# Patient Record
Sex: Female | Born: 1950 | Race: White | Hispanic: No | Marital: Married | Smoking: Never smoker
Health system: Southern US, Community
[De-identification: ages and names within clinical notes are randomized; demographics above are authoritative.]

---

## 1988-04-23 DIAGNOSIS — D0359 Melanoma in situ of other part of trunk: Secondary | ICD-10-CM

## 1988-04-23 HISTORY — DX: Melanoma in situ of other part of trunk: D03.59

## 2004-03-30 ENCOUNTER — Other Ambulatory Visit: Admission: RE | Admit: 2004-03-30 | Discharge: 2004-03-30 | Payer: Self-pay | Admitting: Unknown Physician Specialty

## 2006-02-05 ENCOUNTER — Ambulatory Visit (HOSPITAL_COMMUNITY): Admission: RE | Admit: 2006-02-05 | Discharge: 2006-02-05 | Payer: Self-pay | Admitting: Family Medicine

## 2006-12-21 ENCOUNTER — Emergency Department (HOSPITAL_COMMUNITY): Admission: EM | Admit: 2006-12-21 | Discharge: 2006-12-22 | Payer: Self-pay | Admitting: Emergency Medicine

## 2008-03-02 ENCOUNTER — Ambulatory Visit (HOSPITAL_COMMUNITY): Admission: RE | Admit: 2008-03-02 | Discharge: 2008-03-02 | Payer: Self-pay | Admitting: Family Medicine

## 2009-04-05 ENCOUNTER — Ambulatory Visit (HOSPITAL_COMMUNITY): Admission: RE | Admit: 2009-04-05 | Discharge: 2009-04-05 | Payer: Self-pay | Admitting: Family Medicine

## 2011-02-02 LAB — CBC
Hemoglobin: 12.2
MCHC: 34.5
MCV: 92.8
RBC: 3.81 — ABNORMAL LOW

## 2011-02-02 LAB — BASIC METABOLIC PANEL
CO2: 28
Chloride: 100
GFR calc Af Amer: >60
Potassium: 2.5 — CL
Sodium: 135

## 2011-02-02 LAB — RAPID URINE DRUG SCREEN, HOSP PERFORMED
Amphetamines: NOT DETECTED
Cocaine: NOT DETECTED
Tetrahydrocannabinol: NOT DETECTED

## 2011-02-02 LAB — DIFFERENTIAL
Basophils Relative: 0
Eosinophils Absolute: 0.1
Monocytes Absolute: 0.4
Monocytes Relative: 6

## 2016-03-01 ENCOUNTER — Other Ambulatory Visit: Payer: Self-pay | Admitting: Family Medicine

## 2016-03-01 DIAGNOSIS — Z1231 Encounter for screening mammogram for malignant neoplasm of breast: Secondary | ICD-10-CM

## 2016-03-19 ENCOUNTER — Ambulatory Visit (HOSPITAL_COMMUNITY)
Admission: RE | Admit: 2016-03-19 | Discharge: 2016-03-19 | Disposition: A | Discharge status: Home / Self Care | Payer: BLUE CROSS/BLUE SHIELD | Source: Ambulatory Visit | Attending: Family Medicine | Admitting: Family Medicine

## 2016-03-19 ENCOUNTER — Encounter (HOSPITAL_COMMUNITY): Payer: Self-pay | Admitting: Radiology

## 2016-03-19 DIAGNOSIS — Z1231 Encounter for screening mammogram for malignant neoplasm of breast: Secondary | ICD-10-CM | POA: Diagnosis not present

## 2016-03-29 ENCOUNTER — Ambulatory Visit (INDEPENDENT_AMBULATORY_CARE_PROVIDER_SITE_OTHER): Payer: BLUE CROSS/BLUE SHIELD | Admitting: Nurse Practitioner

## 2016-03-29 ENCOUNTER — Encounter: Payer: Self-pay | Admitting: Nurse Practitioner

## 2016-03-29 VITALS — BP 110/72 | Temp 97.6°F | Ht 63.0 in | Wt 142.6 lb

## 2016-03-29 DIAGNOSIS — Z1211 Encounter for screening for malignant neoplasm of colon: Secondary | ICD-10-CM | POA: Diagnosis not present

## 2016-03-29 DIAGNOSIS — Z780 Asymptomatic menopausal state: Secondary | ICD-10-CM

## 2016-03-29 DIAGNOSIS — Z0000 Encounter for general adult medical examination without abnormal findings: Secondary | ICD-10-CM

## 2016-03-29 DIAGNOSIS — Z124 Encounter for screening for malignant neoplasm of cervix: Secondary | ICD-10-CM | POA: Diagnosis not present

## 2016-03-29 DIAGNOSIS — R5383 Other fatigue: Secondary | ICD-10-CM | POA: Diagnosis not present

## 2016-03-29 DIAGNOSIS — Z1151 Encounter for screening for human papillomavirus (HPV): Secondary | ICD-10-CM

## 2016-03-29 DIAGNOSIS — Z23 Encounter for immunization: Secondary | ICD-10-CM | POA: Diagnosis not present

## 2016-03-30 LAB — HEPATIC FUNCTION PANEL
ALK PHOS: 75 IU/L (ref 39–117)
ALT: 33 IU/L — AB (ref 0–32)
AST: 34 IU/L (ref 0–40)
Albumin: 4.8 g/dL (ref 3.6–4.8)
BILIRUBIN, DIRECT: 0.21 mg/dL (ref 0.00–0.40)
Bilirubin Total: 0.8 mg/dL (ref 0.0–1.2)
Total Protein: 7.1 g/dL (ref 6.0–8.5)

## 2016-03-30 LAB — TSH: TSH: 1.53 u[IU]/mL (ref 0.450–4.500)

## 2016-03-30 LAB — BASIC METABOLIC PANEL
BUN / CREAT RATIO: 11 — AB (ref 12–28)
BUN: 9 mg/dL (ref 8–27)
CHLORIDE: 101 mmol/L (ref 96–106)
CO2: 25 mmol/L (ref 18–29)
Calcium: 9.6 mg/dL (ref 8.7–10.3)
Creatinine, Ser: 0.82 mg/dL (ref 0.57–1.00)
GFR calc Af Amer: 88 mL/min/{1.73_m2} (ref >59)
GFR calc non Af Amer: 76 mL/min/{1.73_m2} (ref >59)
GLUCOSE: 91 mg/dL (ref 65–99)
Potassium: 3.9 mmol/L (ref 3.5–5.2)
SODIUM: 143 mmol/L (ref 134–144)

## 2016-03-30 LAB — LIPID PANEL
CHOL/HDL RATIO: 2.1 ratio (ref 0.0–4.4)
Cholesterol, Total: 228 mg/dL — ABNORMAL HIGH (ref 100–199)
HDL: 110 mg/dL (ref >39)
LDL Calculated: 106 mg/dL — ABNORMAL HIGH (ref 0–99)
TRIGLYCERIDES: 58 mg/dL (ref 0–149)
VLDL CHOLESTEROL CAL: 12 mg/dL (ref 5–40)

## 2016-03-30 LAB — VITAMIN D 25 HYDROXY (VIT D DEFICIENCY, FRACTURES): Vit D, 25-Hydroxy: 16.8 ng/mL — ABNORMAL LOW (ref 30.0–100.0)

## 2016-03-31 ENCOUNTER — Encounter: Payer: Self-pay | Admitting: Nurse Practitioner

## 2016-03-31 DIAGNOSIS — L578 Other skin changes due to chronic exposure to nonionizing radiation: Secondary | ICD-10-CM | POA: Insufficient documentation

## 2016-03-31 NOTE — Progress Notes (Signed)
   Subjective:    Patient ID: Melody Ward, female    DOB: 11-03-1950, 65 y.o.   MRN: [id number]  HPI presents for her wellness exam. Married, same sexual partner. No pelvic pain or vaginal bleeding. Regular vision and dental exams. History of melanoma. Has seen [employer] PA in the past. Very active, regular exercise.     Review of Systems  Constitutional: Positive for fatigue. Negative for activity change and appetite change.       Mild fatigue  HENT: Negative for dental problem, ear pain, sinus pressure and sore throat.   Respiratory: Negative for cough, chest tightness, shortness of breath and wheezing.   Cardiovascular: Negative for chest pain.  Gastrointestinal: Negative for abdominal distention, abdominal pain, blood in stool, constipation, diarrhea, nausea and vomiting.  Genitourinary: Negative for difficulty urinating, dysuria, enuresis, frequency, genital sores, pelvic pain, urgency, vaginal bleeding and vaginal discharge.       Objective:   Physical Exam  Constitutional: She is oriented to person, place, and time. She appears well-developed. No distress.  HENT:  Right Ear: External ear normal.  Left Ear: External ear normal.  Mouth/Throat: Oropharynx is clear and moist.  Neck: Normal range of motion. Neck supple. No tracheal deviation present. No thyromegaly present.  Cardiovascular: Normal rate, regular rhythm and normal heart sounds.  Exam reveals no gallop.   No murmur heard. Pulmonary/Chest: Effort normal and breath sounds normal.  Abdominal: Soft. She exhibits no distension. There is no tenderness.  Genitourinary: Vagina normal and uterus normal. No vaginal discharge found.  Genitourinary Comments: External GU: no rashes or lesions. Vagina: no discharge. Cervix normal in appearance. No CMT. Bimanual exam: no tenderness or obvious masses. Rectal exam: no masses; no stool for hemoccult.   Musculoskeletal: She exhibits no edema.  Lymphadenopathy:    She has no  cervical adenopathy.  Neurological: She is alert and oriented to person, place, and time.  Skin: Skin is warm and dry. No rash noted.  Significant sun damage.   Psychiatric: She has a normal mood and affect. Her behavior is normal.  Vitals reviewed. Breast exam: no masses; axillae no adenopathy.        Assessment & Plan:  Routine general medical examination at a health care facility - Plan: Pap IG and HPV (high risk) DNA detection, Lipid panel, Hepatic function panel, Basic metabolic panel, Ambulatory referral to Gastroenterology  Screening for cervical cancer - Plan: Pap IG and HPV (high risk) DNA detection  Screening for HPV (human papillomavirus) - Plan: Pap IG and HPV (high risk) DNA detection  Fatigue, unspecified type - Plan: TSH, VITAMIN D 25 Hydroxy (Vit-D Deficiency, Fractures)  Need for vaccination - Plan: Flu Vaccine QUAD 36+ mos IM  Postmenopausal - Plan: DG Bone Density  Screen for colon cancer - Plan: Ambulatory referral to Gastroenterology   Recommend recheck with dermatology. Recommend daily vitamin D and calcium.  Return in about 1 year (around 03/29/2017) for physical.

## 2016-04-03 ENCOUNTER — Encounter (INDEPENDENT_AMBULATORY_CARE_PROVIDER_SITE_OTHER): Payer: Self-pay | Admitting: *Deleted

## 2016-04-03 ENCOUNTER — Encounter: Payer: Self-pay | Admitting: Family Medicine

## 2016-04-03 LAB — PAP IG AND HPV HIGH-RISK
HPV, high-risk: NEGATIVE
PAP Smear Comment: 0

## 2016-04-04 ENCOUNTER — Ambulatory Visit (HOSPITAL_COMMUNITY)
Admission: RE | Admit: 2016-04-04 | Discharge: 2016-04-04 | Disposition: A | Discharge status: Home / Self Care | Payer: BLUE CROSS/BLUE SHIELD | Source: Ambulatory Visit | Attending: Nurse Practitioner | Admitting: Nurse Practitioner

## 2016-04-04 DIAGNOSIS — M85851 Other specified disorders of bone density and structure, right thigh: Secondary | ICD-10-CM | POA: Diagnosis not present

## 2016-04-04 DIAGNOSIS — Z780 Asymptomatic menopausal state: Secondary | ICD-10-CM | POA: Insufficient documentation

## 2016-04-04 DIAGNOSIS — M8588 Other specified disorders of bone density and structure, other site: Secondary | ICD-10-CM | POA: Diagnosis not present

## 2016-04-17 ENCOUNTER — Other Ambulatory Visit: Payer: Self-pay | Admitting: Nurse Practitioner

## 2016-04-17 ENCOUNTER — Encounter: Payer: Self-pay | Admitting: Nurse Practitioner

## 2016-04-17 DIAGNOSIS — M8580 Other specified disorders of bone density and structure, unspecified site: Secondary | ICD-10-CM | POA: Insufficient documentation

## 2016-04-17 MED ORDER — VITAMIN D (ERGOCALCIFEROL) 1.25 MG (50000 UNIT) PO CAPS: 50000.0000 [IU] | ORAL_CAPSULE | ORAL | 2 refills | Status: DC

## 2016-10-15 ENCOUNTER — Encounter: Payer: Self-pay | Admitting: Family Medicine

## 2016-10-15 ENCOUNTER — Ambulatory Visit (INDEPENDENT_AMBULATORY_CARE_PROVIDER_SITE_OTHER): Payer: No Typology Code available for payment source | Admitting: Family Medicine

## 2016-10-15 VITALS — BP 128/70 | Temp 98.8°F | Ht 63.0 in | Wt 140.0 lb

## 2016-10-15 DIAGNOSIS — H00036 Abscess of eyelid left eye, unspecified eyelid: Secondary | ICD-10-CM

## 2016-10-15 MED ORDER — CEPHALEXIN 500 MG PO CAPS: 500.0000 mg | ORAL_CAPSULE | Freq: Four times a day (QID) | ORAL | 0 refills | Status: DC

## 2016-10-15 NOTE — Progress Notes (Signed)
   Subjective:    Patient ID: Melody Ward, female    DOB: 24-Jun-1950, 66 y.o.   MRN: [id number]  HPIleft eye redness. Started 3 days ago. Called opthalmologist and he called in a zpack. Pt has not picked up prescription.   She relates having soreness underneath her left eye lid with some redness and swelling underneath the eyelid the pain is not severe she denies any redness or watering in the eyes no crusting no fevers no sinus symptoms no nausea vomiting diarrhea  Review of Systems See above    Objective:   Physical Exam Eardrums normal throat is normal neck no masses no sinus tenderness she does have tenderness in the left lower eyelid which appears to have a gland infected she also has swelling below this on the facial region that is red but she denies tenderness with this  Warning signs were discussed in detail no need for any surgical procedure at this point     Assessment & Plan:  Left eye-gland infection-Keflex 4 times a day for 7-10 days, if progressive troubles or if worse follow-up immediately.

## 2018-01-03 ENCOUNTER — Ambulatory Visit (INDEPENDENT_AMBULATORY_CARE_PROVIDER_SITE_OTHER): Payer: Medicare Other | Admitting: Family Medicine

## 2018-01-03 VITALS — BP 118/82 | Temp 97.6°F | Wt 141.4 lb

## 2018-01-03 DIAGNOSIS — J329 Chronic sinusitis, unspecified: Secondary | ICD-10-CM

## 2018-01-03 DIAGNOSIS — J310 Chronic rhinitis: Secondary | ICD-10-CM

## 2018-01-03 MED ORDER — CEFPROZIL 500 MG PO TABS: 500.0000 mg | ORAL_TABLET | Freq: Two times a day (BID) | ORAL | 0 refills | Status: DC

## 2018-01-03 NOTE — Progress Notes (Signed)
   Subjective:    Patient ID: Melody Ward, female    DOB: 1950/10/28, 67 y.o.   MRN: [id number]  Sinus Problem  This is a new problem. The current episode started 1 to 4 weeks ago. Associated symptoms include congestion, coughing and sinus pressure.   Cong and cough started around two weeks ago  Sometimes will start coughing  No sore throat  Gets hoarse off and onsince two yrs ago  Uses claritin plus muc d and getting gunky suff and green disch  dughter gets ogg    Non smoker    Feels molst up in the sinus area        Review of Systems  HENT: Positive for congestion and sinus pressure.   Respiratory: Positive for cough.        Objective:   Physical Exam  Alert, mild malaise. Hydration good Vitals stable. frontal/ maxillary tenderness evident positive nasal congestion. pharynx normal neck supple  lungs clear/no crackles or wheezes. heart regular in rhythm       Assessment & Plan:  Impression rhinosinusitis likely post viral, discussed with patient. plan antibiotics prescribed. Questions answered. Symptomatic care discussed. warning signs discussed. WSL Patient notes chronic recurrent drainage tickle cough and occasional hoarseness comes and goes for years.  Does not desire major work-up at this time

## 2018-03-24 ENCOUNTER — Telehealth: Payer: Self-pay | Admitting: *Deleted

## 2018-03-24 ENCOUNTER — Emergency Department (HOSPITAL_COMMUNITY): Payer: Medicare HMO

## 2018-03-24 ENCOUNTER — Other Ambulatory Visit: Payer: Self-pay

## 2018-03-24 ENCOUNTER — Encounter (HOSPITAL_COMMUNITY): Payer: Self-pay | Admitting: Emergency Medicine

## 2018-03-24 ENCOUNTER — Emergency Department (HOSPITAL_COMMUNITY)
Admission: EM | Admit: 2018-03-24 | Discharge: 2018-03-24 | Disposition: A | Discharge status: Home / Self Care | Payer: Medicare HMO | Attending: Emergency Medicine | Admitting: Emergency Medicine

## 2018-03-24 DIAGNOSIS — Y9389 Activity, other specified: Secondary | ICD-10-CM | POA: Insufficient documentation

## 2018-03-24 DIAGNOSIS — Z87891 Personal history of nicotine dependence: Secondary | ICD-10-CM | POA: Diagnosis not present

## 2018-03-24 DIAGNOSIS — M7022 Olecranon bursitis, left elbow: Secondary | ICD-10-CM | POA: Insufficient documentation

## 2018-03-24 DIAGNOSIS — Z79899 Other long term (current) drug therapy: Secondary | ICD-10-CM | POA: Insufficient documentation

## 2018-03-24 DIAGNOSIS — M25522 Pain in left elbow: Secondary | ICD-10-CM | POA: Diagnosis not present

## 2018-03-24 DIAGNOSIS — S59902A Unspecified injury of left elbow, initial encounter: Secondary | ICD-10-CM | POA: Diagnosis not present

## 2018-03-24 NOTE — ED Triage Notes (Signed)
Pt fell this morning in a hurry carrying several items and landing on her left elbow, pain and swelling.

## 2018-03-24 NOTE — Telephone Encounter (Signed)
Patient called and stated she fell this am and now her elbow is swelling and painful. Consult with Dr Nicki Reaper who advised patient to go to the ER for evaluation, xray and treatment. Patient verbalized understanding and stated she would go to ER.

## 2018-03-24 NOTE — ED Provider Notes (Signed)
[facility] EMERGENCY DEPARTMENT Provider Note   CSN: [id number] Arrival date & time: 03/24/18  1219     History   Chief Complaint Chief Complaint  Patient presents with  . elbow pain    HPI Melody Ward is a 67 y.o. female presenting for evaluation of left elbow pain and swelling.  She was walking down her 3 steps stoop this morning with her hands full, endorsing 1 of her shoes was not completely on which she suspects caused her to stumble, landing on the wooden step with her left elbow.  She endorses full range of motion of the elbow with minimal discomfort, but has had increased swelling and bruising at the site after going to her workout class in attempting to do push-ups.  She has applied ice to the elbow and is also taken ibuprofen 400 mg prior to arrival.  Denies weakness or numbness distal to the injury site and denies any other injuries including head, neck, shoulder hip or knee pain.  The history is provided by the patient.    History reviewed. No pertinent past medical history.  Patient Active Problem List   Diagnosis Date Noted  . Osteopenia 04/17/2016  . Sun-damaged skin 03/31/2016    History reviewed. No pertinent surgical history.   OB History   None      Home Medications    Prior to Admission medications   Medication Sig Start Date End Date Taking? Authorizing Provider  cefPROZIL (CEFZIL) 500 MG tablet Take 1 tablet (500 mg total) by mouth 2 (two) times daily. 01/03/18   Mikey Kirschner, MD  cephALEXin (KEFLEX) 500 MG capsule Take 1 capsule (500 mg total) by mouth 4 (four) times daily. Patient not taking: Reported on 01/03/2018 10/15/16   Kathyrn Drown, MD    Family History No family history on file.  Social History Social History   Tobacco Use  . Smoking status: Former Research scientist (life sciences)  . Smokeless tobacco: Never Used  Substance Use Topics  . Alcohol use: Yes    Comment: wine occ  . Drug use: Not Currently     Allergies   Patient has no known  allergies.   Review of Systems Review of Systems  Constitutional: Negative for fever.  Gastrointestinal: Negative for nausea and vomiting.  Musculoskeletal: Positive for arthralgias and joint swelling. Negative for back pain, myalgias and neck pain.  Neurological: Negative for weakness, numbness and headaches.     Physical Exam Updated Vital Signs BP (!) 149/96 (BP Location: Right Arm)   Pulse 69   Temp 98.1 F (36.7 C) (Oral)   Resp 12   Ht 5\' 3"  (1.6 m)   Wt 63.5 kg   SpO2 100%   BMI 24.80 kg/m   Physical Exam  Constitutional: She appears well-developed and well-nourished.  HENT:  Head: Atraumatic.  Neck: Normal range of motion.  Cardiovascular:  Pulses:      Radial pulses are 2+ on the right side, and 2+ on the left side.  Pulses equal bilaterally  Musculoskeletal: She exhibits edema and tenderness.       Left elbow: She exhibits swelling. Tenderness found. Olecranon process tenderness noted.  Edema and bruising noted left posterior elbow.  No palpable deformity.  Forearm wrist, hand and shoulder are all nontender.  Patient displays full range of motion of all the joints in her left arm without complaint of pain.  Neurological: She is alert. She has normal strength. She displays normal reflexes. No sensory deficit.  Equal grip  strength.  Skin: Skin is warm and dry.  Psychiatric: She has a normal mood and affect.     ED Treatments / Results  Labs (all labs ordered are listed, but only abnormal results are displayed) Labs Reviewed - No data to display  EKG None  Radiology Dg Elbow Complete Left  Result Date: 03/24/2018 CLINICAL DATA:  Pain after fall EXAM: LEFT ELBOW - COMPLETE 3+ VIEW COMPARISON:  None. FINDINGS: No fracture, dislocation, or joint effusion identified. Enthesopathic changes are associated with the humeral epicondyles. Rounded radiodensities project over the subcutaneous tissues of the upper arm, of doubtful acute significance. Recommend  clinical correlation. IMPRESSION: No fracture or dislocation.  No joint effusion noted. Electronically Signed   By: Dorise Bullion III M.D   On: 03/24/2018 13:26    Procedures Procedures (including critical care time)  Medications Ordered in ED Medications - No data to display   Initial Impression / Assessment and Plan / ED Course  I have reviewed the triage vital signs and the nursing notes.  Pertinent labs & imaging results that were available during my care of the patient were reviewed by me and considered in my medical decision making (see chart for details).     Imaging reviewed and discussed with patient.  She was put in an Ace wrap for gentle compression.  Advised continued ice, rest, ibuprofen.  Follow-up with her PCP for recheck in 7 to 10 days if she is not obtaining improvement of this swelling.  Final Clinical Impressions(s) / ED Diagnoses   Final diagnoses:  Olecranon bursitis of left elbow    ED Discharge Orders    None       Landis Martins 03/24/18 1345    Elnora Morrison, MD 03/24/18 1549

## 2018-03-24 NOTE — ED Notes (Addendum)
Patient had single trip and fall. Landed on left elbow. Patient tried to workout at gym afterwards and experienced pain with pushups. PCP recommended to get Xray at ED. Pt experiencing no pain at this time.

## 2018-03-24 NOTE — Discharge Instructions (Addendum)
Your x-rays are negative for any broken bones or dislocation.  Your exam and swelling suggest that your bursa is inflamed causing this pain and swelling.  I recommend using an ice pack as much as is comfortable for the next 2 days to help reduce the swelling along with reduced range of motion of the elbow joint.  A gentle Ace wrap over the joint may help.  I recommend continuing your ibuprofen 400 mg 3 times daily.  Plan to follow-up with your primary doctor for recheck if your symptoms are not improving over the next week to 10 days or if you develop any new symptoms such as increasing pain, redness or worse swelling at the site.

## 2018-03-24 NOTE — Telephone Encounter (Signed)
I agree with management 

## 2018-06-04 ENCOUNTER — Emergency Department (HOSPITAL_COMMUNITY)
Admission: EM | Admit: 2018-06-04 | Discharge: 2018-06-04 | Disposition: A | Discharge status: Home / Self Care | Payer: Medicare HMO | Attending: Emergency Medicine | Admitting: Emergency Medicine

## 2018-06-04 ENCOUNTER — Other Ambulatory Visit: Payer: Self-pay

## 2018-06-04 ENCOUNTER — Encounter (HOSPITAL_COMMUNITY): Payer: Self-pay | Admitting: Emergency Medicine

## 2018-06-04 ENCOUNTER — Emergency Department (HOSPITAL_COMMUNITY): Payer: Medicare HMO

## 2018-06-04 DIAGNOSIS — Y9301 Activity, walking, marching and hiking: Secondary | ICD-10-CM | POA: Diagnosis not present

## 2018-06-04 DIAGNOSIS — W109XXA Fall (on) (from) unspecified stairs and steps, initial encounter: Secondary | ICD-10-CM | POA: Diagnosis not present

## 2018-06-04 DIAGNOSIS — S52611A Displaced fracture of right ulna styloid process, initial encounter for closed fracture: Secondary | ICD-10-CM

## 2018-06-04 DIAGNOSIS — Y929 Unspecified place or not applicable: Secondary | ICD-10-CM | POA: Insufficient documentation

## 2018-06-04 DIAGNOSIS — S6991XA Unspecified injury of right wrist, hand and finger(s), initial encounter: Secondary | ICD-10-CM | POA: Diagnosis present

## 2018-06-04 DIAGNOSIS — Y999 Unspecified external cause status: Secondary | ICD-10-CM | POA: Diagnosis not present

## 2018-06-04 DIAGNOSIS — S52571A Other intraarticular fracture of lower end of right radius, initial encounter for closed fracture: Secondary | ICD-10-CM

## 2018-06-04 MED ORDER — HYDROCODONE-ACETAMINOPHEN 5-325 MG PO TABS: ORAL_TABLET | ORAL | 0 refills | Status: DC

## 2018-06-04 NOTE — Discharge Instructions (Addendum)
Elevate your wrist.  You may apply ice packs if needed.  Call Dr. Bertis Ruddy office to arrange a follow-up appointment.  He wants to see you in his office tomorrow you can call for an appointment time.

## 2018-06-04 NOTE — ED Provider Notes (Signed)
[facility] EMERGENCY DEPARTMENT Provider Note   CSN: [id number] Arrival date & time: 06/04/18  1026     History   Chief Complaint Chief Complaint  Patient presents with  . Wrist Pain    HPI Melody Ward is a 68 y.o. female.  HPI   Melody Ward is a 68 y.o. female who presents to the Emergency Department complaining of mechanical fall that occurred last evening.  She complains of pain and swelling to her right wrist.  She states that she fell down several steps landing on her buttocks and she is unclear how she injured her wrist.  She complains of increasing pain and swelling that is worse with movement.  Is applied ice last evening and again today and taking ibuprofen with moderate relief.  She denies pain to her elbow or shoulder, neck pain, head injury, LOC or back pain.  She is right-hand dominant.    History reviewed. No pertinent past medical history.  Patient Active Problem List   Diagnosis Date Noted  . Osteopenia 04/17/2016  . Sun-damaged skin 03/31/2016    Past Surgical History:  Procedure Laterality Date  . CESAREAN SECTION       OB History    Gravida      Para      Term      Preterm      AB      Living  1     SAB      TAB      Ectopic      Multiple      Live Births               Home Medications    Prior to Admission medications   Medication Sig Start Date End Date Taking? Authorizing Provider  cefPROZIL (CEFZIL) 500 MG tablet Take 1 tablet (500 mg total) by mouth 2 (two) times daily. 01/03/18   Mikey Kirschner, MD  cephALEXin (KEFLEX) 500 MG capsule Take 1 capsule (500 mg total) by mouth 4 (four) times daily. Patient not taking: Reported on 01/03/2018 10/15/16   Kathyrn Drown, MD    Family History History reviewed. No pertinent family history.  Social History Social History   Tobacco Use  . Smoking status: Former Research scientist (life sciences)  . Smokeless tobacco: Never Used  Substance Use Topics  . Alcohol use: Yes    Comment: wine  occ  . Drug use: Not Currently     Allergies   Patient has no known allergies.   Review of Systems Review of Systems  Constitutional: Negative for chills and fever.  Respiratory: Negative for shortness of breath.   Cardiovascular: Negative for chest pain.  Musculoskeletal: Positive for arthralgias (Right wrist pain and swelling) and joint swelling. Negative for back pain and neck pain.  Skin: Negative for color change and wound.  Neurological: Negative for dizziness, syncope, weakness and numbness.     Physical Exam Updated Vital Signs BP 132/86 (BP Location: Left Arm)   Pulse 70   Temp 98 F (36.7 C) (Oral)   Resp 18   Ht 5\' 3"  (1.6 m)   Wt 62.6 kg   SpO2 98%   BMI 24.45 kg/m   Physical Exam Vitals signs and nursing note reviewed.  Constitutional:      General: She is not in acute distress.    Appearance: Normal appearance. She is well-developed.  HENT:     Head: Atraumatic.  Neck:     Musculoskeletal: Normal range of motion  and neck supple.  Cardiovascular:     Rate and Rhythm: Normal rate and regular rhythm.     Pulses: Normal pulses.  Pulmonary:     Effort: Pulmonary effort is normal.     Breath sounds: Normal breath sounds.  Musculoskeletal:        General: Swelling, tenderness and signs of injury present. No deformity.     Comments: Tenderness to palpation of the distal right wrist.  Moderate edema noted.  Edema extends into the dorsum of the hand.  Fingers are nontender.  No open wound.  No tenderness proximal to the wrist.  No definite bony deformity.  Skin:    General: Skin is warm and dry.     Capillary Refill: Capillary refill takes less than 2 seconds.  Neurological:     General: No focal deficit present.     Mental Status: She is alert.     Sensory: No sensory deficit.     Motor: No weakness or abnormal muscle tone.     Coordination: Coordination normal.      ED Treatments / Results  Labs (all labs ordered are listed, but only abnormal  results are displayed) Labs Reviewed - No data to display  EKG None  Radiology Dg Wrist Complete Right  Result Date: 06/04/2018 CLINICAL DATA:  Acute wrist pain following fall yesterday. Initial encounter. EXAM: RIGHT WRIST - COMPLETE 3+ VIEW COMPARISON:  None. FINDINGS: A comminuted impacted intra-articular distal radial fracture is identified with associated soft tissue swelling. An ulnar styloid fracture is present. No dislocation. IMPRESSION: Comminuted impacted intra-articular distal radial fracture and ulnar styloid fracture. Electronically Signed   By: Margarette Canada M.D.   On: 06/04/2018 11:45    Procedures Procedures (including critical care time)  Medications Ordered in ED Medications - No data to display   Initial Impression / Assessment and Plan / ED Course  I have reviewed the triage vital signs and the nursing notes.  Pertinent labs & imaging results that were available during my care of the patient were reviewed by me and considered in my medical decision making (see chart for details).     Patient with closed impacted and comminuted distal radius fracture.  Neurovascularly intact.  Compartments are soft. Will consult hand surgeon.    [place], Dr. Burney Gauze, recommends sugar tong splint and he will see pt in his office tomorrow.    1300 sugar tong applied, pain improved, remains NV intact.  Pt agrees to f/u instructions  Final Clinical Impressions(s) / ED Diagnoses   Final diagnoses:  Other closed intra-articular fracture of distal end of right radius, initial encounter  Closed displaced fracture of styloid process of right ulna, initial encounter    ED Discharge Orders    None       Kem Parkinson, PA-C 06/04/18 1305    Fredia Sorrow, MD 06/10/18 [phone]

## 2018-06-04 NOTE — ED Provider Notes (Signed)
Medical screening examination/treatment/procedure(s) were conducted as a shared visit with non-physician practitioner(s) and myself.  I personally evaluated the patient during the encounter.  None  Results for orders placed or performed in visit on 03/29/16  Lipid panel  Result Value Ref Range   Cholesterol, Total 228 (H) 100 - 199 mg/dL   Triglycerides 58 0 - 149 mg/dL   HDL 110 >39 mg/dL   VLDL Cholesterol Cal 12 5 - 40 mg/dL   LDL Calculated 106 (H) 0 - 99 mg/dL   Chol/HDL Ratio 2.1 0.0 - 4.4 ratio units  Hepatic function panel  Result Value Ref Range   Total Protein 7.1 6.0 - 8.5 g/dL   Albumin 4.8 3.6 - 4.8 g/dL   Bilirubin Total 0.8 0.0 - 1.2 mg/dL   Bilirubin, Direct 0.21 0.00 - 0.40 mg/dL   Alkaline Phosphatase 75 39 - 117 IU/L   AST 34 0 - 40 IU/L   ALT 33 (H) 0 - 32 IU/L  Basic metabolic panel  Result Value Ref Range   Glucose 91 65 - 99 mg/dL   BUN 9 8 - 27 mg/dL   Creatinine, Ser 0.82 0.57 - 1.00 mg/dL   GFR calc non Af Amer 76 >59 mL/min/1.73   GFR calc Af Amer 88 >59 mL/min/1.73   BUN/Creatinine Ratio 11 (L) 12 - 28   Sodium 143 134 - 144 mmol/L   Potassium 3.9 3.5 - 5.2 mmol/L   Chloride 101 96 - 106 mmol/L   CO2 25 18 - 29 mmol/L   Calcium 9.6 8.7 - 10.3 mg/dL  TSH  Result Value Ref Range   TSH 1.530 0.450 - 4.500 uIU/mL  VITAMIN D 25 Hydroxy (Vit-D Deficiency, Fractures)  Result Value Ref Range   Vit D, 25-Hydroxy 16.8 (L) 30.0 - 100.0 ng/mL  Pap IG and HPV (high risk) DNA detection  Result Value Ref Range   DIAGNOSIS: Comment    Specimen adequacy: Comment    Clinician Provided ICD10 Comment    Performed by: Comment    QC reviewed by: Comment    PAP Smear Comment .    PATHOLOGIST PROVIDED ICD10: Comment    Note: Comment    Test Methodology Comment    HPV, high-risk Negative Negative   Dg Wrist Complete Right  Result Date: 06/04/2018 CLINICAL DATA:  Acute wrist pain following fall yesterday. Initial encounter. EXAM: RIGHT WRIST - COMPLETE 3+  VIEW COMPARISON:  None. FINDINGS: A comminuted impacted intra-articular distal radial fracture is identified with associated soft tissue swelling. An ulnar styloid fracture is present. No dislocation. IMPRESSION: Comminuted impacted intra-articular distal radial fracture and ulnar styloid fracture. Electronically Signed   By: Margarette Canada M.D.   On: 06/04/2018 11:45     Patient seen by me along with the physician assistant.  Patient had a fall resulting in a comminuted impacted intra-articular distal radial fracture and ulnar styloid fracture.  Discussed with Dr. Burney Gauze who will follow the patient up.  We will put the patient in a sugar tong splint.  No other injuries.  Patient distally with good cap refill.  Good sensation.     Fredia Sorrow, MD 06/04/18 1249

## 2018-06-04 NOTE — ED Triage Notes (Signed)
PT states she slipped and fell last night and fell onto her right wrist. PT c/o swelling and pain to right wrist with ROM.

## 2018-06-04 NOTE — ED Notes (Signed)
Ice pack given to pt for her wrist.

## 2018-06-05 DIAGNOSIS — S52571A Other intraarticular fracture of lower end of right radius, initial encounter for closed fracture: Secondary | ICD-10-CM | POA: Diagnosis not present

## 2018-06-06 DIAGNOSIS — G8918 Other acute postprocedural pain: Secondary | ICD-10-CM | POA: Diagnosis not present

## 2018-06-06 DIAGNOSIS — S52571A Other intraarticular fracture of lower end of right radius, initial encounter for closed fracture: Secondary | ICD-10-CM | POA: Diagnosis not present

## 2018-06-06 DIAGNOSIS — Y999 Unspecified external cause status: Secondary | ICD-10-CM | POA: Diagnosis not present

## 2018-06-06 HISTORY — PX: WRIST SURGERY: SHX841

## 2018-06-09 DIAGNOSIS — M25531 Pain in right wrist: Secondary | ICD-10-CM | POA: Diagnosis not present

## 2018-06-09 DIAGNOSIS — M25631 Stiffness of right wrist, not elsewhere classified: Secondary | ICD-10-CM | POA: Diagnosis not present

## 2018-06-16 DIAGNOSIS — S52571A Other intraarticular fracture of lower end of right radius, initial encounter for closed fracture: Secondary | ICD-10-CM | POA: Diagnosis not present

## 2018-06-30 DIAGNOSIS — S52571A Other intraarticular fracture of lower end of right radius, initial encounter for closed fracture: Secondary | ICD-10-CM | POA: Diagnosis not present

## 2018-07-22 DIAGNOSIS — S52571A Other intraarticular fracture of lower end of right radius, initial encounter for closed fracture: Secondary | ICD-10-CM | POA: Diagnosis not present

## 2018-08-12 DIAGNOSIS — S52571A Other intraarticular fracture of lower end of right radius, initial encounter for closed fracture: Secondary | ICD-10-CM | POA: Diagnosis not present

## 2018-09-17 ENCOUNTER — Other Ambulatory Visit: Payer: Self-pay

## 2018-09-17 ENCOUNTER — Ambulatory Visit (INDEPENDENT_AMBULATORY_CARE_PROVIDER_SITE_OTHER): Payer: Medicare HMO | Admitting: Family Medicine

## 2018-09-17 DIAGNOSIS — J329 Chronic sinusitis, unspecified: Secondary | ICD-10-CM | POA: Diagnosis not present

## 2018-09-17 DIAGNOSIS — R490 Dysphonia: Secondary | ICD-10-CM

## 2018-09-17 DIAGNOSIS — J310 Chronic rhinitis: Secondary | ICD-10-CM

## 2018-09-17 MED ORDER — CEFDINIR 300 MG PO CAPS: 300.0000 mg | ORAL_CAPSULE | Freq: Two times a day (BID) | ORAL | 0 refills | Status: DC

## 2018-09-17 NOTE — Progress Notes (Signed)
   Subjective:    Patient ID: Melody Ward, female    DOB: 03/12/1951, 68 y.o.   MRN: [id number] Audio plus video Cough  This is a new problem. The current episode started in the past 7 days. Associated symptoms include a sore throat. Associated symptoms comments: Dry cough-problems with voice.      Review of Systems  HENT: Positive for sore throat.   Respiratory: Positive for cough.    Virtual Visit via Video Note  I connected with Melody Ward on 09/17/18 at  1:10 PM EDT by a video enabled telemedicine application and verified that I am speaking with the correct person using two identifiers.  Location: Patient: home Provider: office   I discussed the limitations of evaluation and management by telemedicine and the availability of in person appointments. The patient expressed understanding and agreed to proceed.  History of Present Illness:    Observations/Objective:   Assessment and Plan:   Follow Up Instructions:    I discussed the assessment and treatment plan with the patient. The patient was provided an opportunity to ask questions and all were answered. The patient agreed with the plan and demonstrated an understanding of the instructions.   The patient was advised to call back or seek an in-person evaluation if the symptoms worsen or if the condition fails to improve as anticipated.  I provided 25 minutes of non-face-to-face time during this encounter.  Patient concerned regarding the intermittent and chronic nature of her hoarseness.  At times will settle in 4 weeks on and until it improves.  Patient starting to worry about it.  There is some history of cancer and a friend who had some similar symptoms which is making the patient worried even more.  No fever no chills.  No nasal discharge.  Patient reports some cough productive at times in the past couple weeks   No headache, no major weight loss or weight gain, no chest pain no back pain abdominal pain no  change in bowel habits complete ROS otherwise negative     Objective:   Physical Exam  Virtual visit      Assessment & Plan:  Impression acute laryngitis with potential element of rhinosinusitis and or bronchitis.  Antibiotics prescribed symptom care discussed.  2.  Chronic hoarseness/laryngitis.  Patient feels motivated to take this to the next level and I think that is a good idea.  ENT referral.  Potential etiologies discussed with patient

## 2018-09-23 DIAGNOSIS — H52229 Regular astigmatism, unspecified eye: Secondary | ICD-10-CM | POA: Diagnosis not present

## 2018-09-23 DIAGNOSIS — Z0100 Encounter for examination of eyes and vision without abnormal findings: Secondary | ICD-10-CM | POA: Diagnosis not present

## 2018-10-07 ENCOUNTER — Encounter: Payer: Self-pay | Admitting: Family Medicine

## 2018-10-16 ENCOUNTER — Telehealth: Payer: Self-pay | Admitting: Family Medicine

## 2018-10-16 NOTE — Telephone Encounter (Signed)
[facility] - need correct mailing address, mail was returned

## 2018-10-21 ENCOUNTER — Other Ambulatory Visit: Payer: Medicare HMO

## 2018-10-21 ENCOUNTER — Other Ambulatory Visit: Payer: Self-pay

## 2018-10-21 DIAGNOSIS — Z20822 Contact with and (suspected) exposure to covid-19: Secondary | ICD-10-CM

## 2018-10-21 DIAGNOSIS — R6889 Other general symptoms and signs: Secondary | ICD-10-CM | POA: Diagnosis not present

## 2018-10-27 ENCOUNTER — Ambulatory Visit (INDEPENDENT_AMBULATORY_CARE_PROVIDER_SITE_OTHER): Payer: Medicare HMO | Admitting: Otolaryngology

## 2018-10-27 DIAGNOSIS — R070 Pain in throat: Secondary | ICD-10-CM

## 2018-10-27 DIAGNOSIS — K219 Gastro-esophageal reflux disease without esophagitis: Secondary | ICD-10-CM | POA: Diagnosis not present

## 2018-10-27 LAB — NOVEL CORONAVIRUS, NAA: SARS-CoV-2, NAA: NOT DETECTED

## 2018-11-05 ENCOUNTER — Telehealth: Payer: Self-pay | Admitting: *Deleted

## 2018-11-05 NOTE — Telephone Encounter (Signed)
Pt returned call and lab results of covid-19 given to her with verbal understanding. Her results showed that the virus was not detected in her swab.

## 2018-12-01 DIAGNOSIS — R05 Cough: Secondary | ICD-10-CM | POA: Diagnosis not present

## 2018-12-01 DIAGNOSIS — J040 Acute laryngitis: Secondary | ICD-10-CM | POA: Diagnosis not present

## 2018-12-08 ENCOUNTER — Ambulatory Visit (INDEPENDENT_AMBULATORY_CARE_PROVIDER_SITE_OTHER): Payer: Medicare HMO | Admitting: Otolaryngology

## 2018-12-22 ENCOUNTER — Ambulatory Visit (INDEPENDENT_AMBULATORY_CARE_PROVIDER_SITE_OTHER): Payer: Medicare HMO | Admitting: Otolaryngology

## 2018-12-22 DIAGNOSIS — K219 Gastro-esophageal reflux disease without esophagitis: Secondary | ICD-10-CM

## 2018-12-22 DIAGNOSIS — R490 Dysphonia: Secondary | ICD-10-CM

## 2019-01-16 DIAGNOSIS — R490 Dysphonia: Secondary | ICD-10-CM | POA: Diagnosis not present

## 2019-01-16 DIAGNOSIS — J383 Other diseases of vocal cords: Secondary | ICD-10-CM | POA: Diagnosis not present

## 2019-01-16 DIAGNOSIS — J3801 Paralysis of vocal cords and larynx, unilateral: Secondary | ICD-10-CM | POA: Diagnosis not present

## 2019-01-19 ENCOUNTER — Other Ambulatory Visit (HOSPITAL_COMMUNITY): Payer: Self-pay | Admitting: Otolaryngology

## 2019-01-19 ENCOUNTER — Other Ambulatory Visit: Payer: Self-pay | Admitting: Otolaryngology

## 2019-01-19 DIAGNOSIS — J3801 Paralysis of vocal cords and larynx, unilateral: Secondary | ICD-10-CM

## 2019-01-20 DIAGNOSIS — M542 Cervicalgia: Secondary | ICD-10-CM | POA: Diagnosis not present

## 2019-01-20 DIAGNOSIS — R490 Dysphonia: Secondary | ICD-10-CM | POA: Diagnosis not present

## 2019-01-20 DIAGNOSIS — J387 Other diseases of larynx: Secondary | ICD-10-CM | POA: Diagnosis not present

## 2019-01-20 DIAGNOSIS — J3801 Paralysis of vocal cords and larynx, unilateral: Secondary | ICD-10-CM | POA: Diagnosis not present

## 2019-01-20 DIAGNOSIS — H9202 Otalgia, left ear: Secondary | ICD-10-CM | POA: Diagnosis not present

## 2019-01-20 DIAGNOSIS — J383 Other diseases of vocal cords: Secondary | ICD-10-CM | POA: Diagnosis not present

## 2019-01-20 DIAGNOSIS — R070 Pain in throat: Secondary | ICD-10-CM | POA: Diagnosis not present

## 2019-01-29 DIAGNOSIS — J387 Other diseases of larynx: Secondary | ICD-10-CM | POA: Diagnosis not present

## 2019-01-29 DIAGNOSIS — C321 Malignant neoplasm of supraglottis: Secondary | ICD-10-CM | POA: Diagnosis not present

## 2019-01-29 DIAGNOSIS — H9202 Otalgia, left ear: Secondary | ICD-10-CM | POA: Diagnosis not present

## 2019-01-29 DIAGNOSIS — R490 Dysphonia: Secondary | ICD-10-CM | POA: Diagnosis not present

## 2019-01-29 DIAGNOSIS — M542 Cervicalgia: Secondary | ICD-10-CM | POA: Diagnosis not present

## 2019-01-29 DIAGNOSIS — J3801 Paralysis of vocal cords and larynx, unilateral: Secondary | ICD-10-CM | POA: Diagnosis not present

## 2019-01-29 DIAGNOSIS — R070 Pain in throat: Secondary | ICD-10-CM | POA: Diagnosis not present

## 2019-02-02 ENCOUNTER — Ambulatory Visit (HOSPITAL_COMMUNITY): Payer: Medicare HMO

## 2019-02-02 ENCOUNTER — Encounter (HOSPITAL_COMMUNITY): Payer: Self-pay

## 2019-02-04 DIAGNOSIS — J387 Other diseases of larynx: Secondary | ICD-10-CM | POA: Diagnosis not present

## 2019-02-04 DIAGNOSIS — C328 Malignant neoplasm of overlapping sites of larynx: Secondary | ICD-10-CM | POA: Diagnosis not present

## 2019-02-04 DIAGNOSIS — R918 Other nonspecific abnormal finding of lung field: Secondary | ICD-10-CM | POA: Diagnosis not present

## 2019-02-04 DIAGNOSIS — C329 Malignant neoplasm of larynx, unspecified: Secondary | ICD-10-CM | POA: Diagnosis not present

## 2019-02-04 DIAGNOSIS — Z7722 Contact with and (suspected) exposure to environmental tobacco smoke (acute) (chronic): Secondary | ICD-10-CM | POA: Diagnosis not present

## 2019-02-10 DIAGNOSIS — Z08 Encounter for follow-up examination after completed treatment for malignant neoplasm: Secondary | ICD-10-CM | POA: Diagnosis not present

## 2019-02-10 DIAGNOSIS — Z1283 Encounter for screening for malignant neoplasm of skin: Secondary | ICD-10-CM | POA: Diagnosis not present

## 2019-02-10 DIAGNOSIS — X32XXXD Exposure to sunlight, subsequent encounter: Secondary | ICD-10-CM | POA: Diagnosis not present

## 2019-02-10 DIAGNOSIS — D0461 Carcinoma in situ of skin of right upper limb, including shoulder: Secondary | ICD-10-CM | POA: Diagnosis not present

## 2019-02-10 DIAGNOSIS — Z85820 Personal history of malignant melanoma of skin: Secondary | ICD-10-CM | POA: Diagnosis not present

## 2019-02-10 DIAGNOSIS — D044 Carcinoma in situ of skin of scalp and neck: Secondary | ICD-10-CM | POA: Diagnosis not present

## 2019-02-10 DIAGNOSIS — L570 Actinic keratosis: Secondary | ICD-10-CM | POA: Diagnosis not present

## 2019-02-12 DIAGNOSIS — C770 Secondary and unspecified malignant neoplasm of lymph nodes of head, face and neck: Secondary | ICD-10-CM | POA: Diagnosis not present

## 2019-02-12 DIAGNOSIS — Z7722 Contact with and (suspected) exposure to environmental tobacco smoke (acute) (chronic): Secondary | ICD-10-CM | POA: Diagnosis not present

## 2019-02-12 DIAGNOSIS — C329 Malignant neoplasm of larynx, unspecified: Secondary | ICD-10-CM | POA: Diagnosis not present

## 2019-02-12 DIAGNOSIS — C321 Malignant neoplasm of supraglottis: Secondary | ICD-10-CM | POA: Diagnosis not present

## 2019-03-03 DIAGNOSIS — Z01810 Encounter for preprocedural cardiovascular examination: Secondary | ICD-10-CM | POA: Diagnosis not present

## 2019-03-03 DIAGNOSIS — Z01812 Encounter for preprocedural laboratory examination: Secondary | ICD-10-CM | POA: Diagnosis not present

## 2019-03-03 DIAGNOSIS — J3801 Paralysis of vocal cords and larynx, unilateral: Secondary | ICD-10-CM | POA: Diagnosis not present

## 2019-03-03 DIAGNOSIS — Z7289 Other problems related to lifestyle: Secondary | ICD-10-CM | POA: Diagnosis not present

## 2019-03-03 DIAGNOSIS — C329 Malignant neoplasm of larynx, unspecified: Secondary | ICD-10-CM | POA: Diagnosis not present

## 2019-03-03 DIAGNOSIS — R490 Dysphonia: Secondary | ICD-10-CM | POA: Diagnosis not present

## 2019-03-06 ENCOUNTER — Other Ambulatory Visit: Payer: Self-pay

## 2019-03-06 DIAGNOSIS — C7989 Secondary malignant neoplasm of other specified sites: Secondary | ICD-10-CM | POA: Diagnosis not present

## 2019-03-06 DIAGNOSIS — Z4659 Encounter for fitting and adjustment of other gastrointestinal appliance and device: Secondary | ICD-10-CM | POA: Diagnosis not present

## 2019-03-06 DIAGNOSIS — R110 Nausea: Secondary | ICD-10-CM | POA: Diagnosis not present

## 2019-03-06 DIAGNOSIS — C328 Malignant neoplasm of overlapping sites of larynx: Secondary | ICD-10-CM | POA: Diagnosis not present

## 2019-03-06 DIAGNOSIS — C770 Secondary and unspecified malignant neoplasm of lymph nodes of head, face and neck: Secondary | ICD-10-CM | POA: Diagnosis not present

## 2019-03-06 DIAGNOSIS — C321 Malignant neoplasm of supraglottis: Secondary | ICD-10-CM | POA: Diagnosis not present

## 2019-03-06 DIAGNOSIS — D62 Acute posthemorrhagic anemia: Secondary | ICD-10-CM | POA: Diagnosis not present

## 2019-03-06 DIAGNOSIS — C801 Malignant (primary) neoplasm, unspecified: Secondary | ICD-10-CM | POA: Diagnosis not present

## 2019-03-06 DIAGNOSIS — J3801 Paralysis of vocal cords and larynx, unilateral: Secondary | ICD-10-CM | POA: Diagnosis not present

## 2019-03-06 DIAGNOSIS — C329 Malignant neoplasm of larynx, unspecified: Secondary | ICD-10-CM | POA: Diagnosis not present

## 2019-03-06 DIAGNOSIS — R490 Dysphonia: Secondary | ICD-10-CM | POA: Diagnosis not present

## 2019-03-06 DIAGNOSIS — Z85820 Personal history of malignant melanoma of skin: Secondary | ICD-10-CM | POA: Diagnosis not present

## 2019-03-06 DIAGNOSIS — E43 Unspecified severe protein-calorie malnutrition: Secondary | ICD-10-CM | POA: Diagnosis not present

## 2019-03-06 DIAGNOSIS — Z930 Tracheostomy status: Secondary | ICD-10-CM | POA: Diagnosis not present

## 2019-03-06 DIAGNOSIS — E876 Hypokalemia: Secondary | ICD-10-CM | POA: Diagnosis not present

## 2019-03-06 DIAGNOSIS — Z6822 Body mass index (BMI) 22.0-22.9, adult: Secondary | ICD-10-CM | POA: Diagnosis not present

## 2019-03-06 HISTORY — PX: NERVE GRAFT: SHX721

## 2019-03-06 HISTORY — PX: VOICE PROSTHESIS: SHX5222

## 2019-03-06 HISTORY — PX: PHARYNGECTOMY: SUR1024

## 2019-03-06 HISTORY — PX: MODIFIED RADICAL NECK DISSECTION: SHX2045

## 2019-03-06 HISTORY — PX: FREE FLAP RADIAL FOREARM: SHX1678

## 2019-03-06 HISTORY — PX: TOTAL LARYNGECTOMY: SHX2543

## 2019-03-06 HISTORY — PX: NECK DISSECTION: SUR422

## 2019-03-13 MED ORDER — ENOXAPARIN SODIUM 40 MG/0.4ML ~~LOC~~ SOLN
40.00 | SUBCUTANEOUS | Status: DC
Start: 2019-03-14 — End: 2019-03-13

## 2019-03-13 MED ORDER — ONDANSETRON HCL 4 MG/2ML IJ SOLN
4.00 | INTRAMUSCULAR | Status: DC
Start: ? — End: 2019-03-13

## 2019-03-13 MED ORDER — ASPIRIN 325 MG PO TABS
325.00 | ORAL_TABLET | ORAL | Status: DC
Start: 2019-03-14 — End: 2019-03-13

## 2019-03-13 MED ORDER — Medication
Status: DC
Start: 2019-03-14 — End: 2019-03-13

## 2019-03-13 MED ORDER — BACITRACIN ZINC 500 UNIT/GM EX OINT
TOPICAL_OINTMENT | CUTANEOUS | Status: DC
Start: 2019-03-13 — End: 2019-03-13

## 2019-03-13 MED ORDER — MELATONIN 3 MG PO TABS
6.00 | ORAL_TABLET | ORAL | Status: DC
Start: ? — End: 2019-03-13

## 2019-03-13 MED ORDER — OXYCODONE HCL 5 MG PO TABS
5.00 | ORAL_TABLET | ORAL | Status: DC
Start: ? — End: 2019-03-13

## 2019-03-13 MED ORDER — GENERIC EXTERNAL MEDICATION
Status: DC
Start: 2019-03-13 — End: 2019-03-13

## 2019-03-13 MED ORDER — CELECOXIB 200 MG PO CAPS
200.00 | ORAL_CAPSULE | ORAL | Status: DC
Start: 2019-03-13 — End: 2019-03-13

## 2019-03-13 MED ORDER — POLYETHYLENE GLYCOL 3350 17 G PO PACK
17.00 | PACK | ORAL | Status: DC
Start: 2019-03-14 — End: 2019-03-13

## 2019-03-13 MED ORDER — ACETAMINOPHEN 500 MG PO TABS
1000.00 | ORAL_TABLET | ORAL | Status: DC
Start: 2019-03-13 — End: 2019-03-13

## 2019-03-13 MED ORDER — Medication
Status: DC
Start: 2019-03-13 — End: 2019-03-13

## 2019-03-13 MED ORDER — GENERIC EXTERNAL MEDICATION
40.00 | Status: DC
Start: 2019-03-13 — End: 2019-03-13

## 2019-03-13 MED ORDER — CHLORHEXIDINE GLUCONATE 0.12 % MT SOLN
15.00 | OROMUCOSAL | Status: DC
Start: 2019-03-13 — End: 2019-03-13

## 2019-03-14 DIAGNOSIS — C329 Malignant neoplasm of larynx, unspecified: Secondary | ICD-10-CM | POA: Diagnosis not present

## 2019-03-14 DIAGNOSIS — Z4801 Encounter for change or removal of surgical wound dressing: Secondary | ICD-10-CM | POA: Diagnosis not present

## 2019-03-14 DIAGNOSIS — Z483 Aftercare following surgery for neoplasm: Secondary | ICD-10-CM | POA: Diagnosis not present

## 2019-03-14 DIAGNOSIS — R1310 Dysphagia, unspecified: Secondary | ICD-10-CM | POA: Diagnosis not present

## 2019-03-14 DIAGNOSIS — J383 Other diseases of vocal cords: Secondary | ICD-10-CM | POA: Diagnosis not present

## 2019-03-14 DIAGNOSIS — M542 Cervicalgia: Secondary | ICD-10-CM | POA: Diagnosis not present

## 2019-03-14 DIAGNOSIS — Z4803 Encounter for change or removal of drains: Secondary | ICD-10-CM | POA: Diagnosis not present

## 2019-03-14 DIAGNOSIS — Z431 Encounter for attention to gastrostomy: Secondary | ICD-10-CM | POA: Diagnosis not present

## 2019-03-14 DIAGNOSIS — Z48298 Encounter for aftercare following other organ transplant: Secondary | ICD-10-CM | POA: Diagnosis not present

## 2019-03-14 DIAGNOSIS — R490 Dysphonia: Secondary | ICD-10-CM | POA: Diagnosis not present

## 2019-03-14 DIAGNOSIS — H9202 Otalgia, left ear: Secondary | ICD-10-CM | POA: Diagnosis not present

## 2019-03-17 DIAGNOSIS — Z9889 Other specified postprocedural states: Secondary | ICD-10-CM | POA: Diagnosis not present

## 2019-03-17 DIAGNOSIS — Z8521 Personal history of malignant neoplasm of larynx: Secondary | ICD-10-CM | POA: Diagnosis not present

## 2019-03-17 DIAGNOSIS — Z4889 Encounter for other specified surgical aftercare: Secondary | ICD-10-CM | POA: Diagnosis not present

## 2019-03-17 DIAGNOSIS — C329 Malignant neoplasm of larynx, unspecified: Secondary | ICD-10-CM | POA: Diagnosis not present

## 2019-03-18 DIAGNOSIS — Z431 Encounter for attention to gastrostomy: Secondary | ICD-10-CM | POA: Diagnosis not present

## 2019-03-18 DIAGNOSIS — Z4803 Encounter for change or removal of drains: Secondary | ICD-10-CM | POA: Diagnosis not present

## 2019-03-18 DIAGNOSIS — M542 Cervicalgia: Secondary | ICD-10-CM | POA: Diagnosis not present

## 2019-03-18 DIAGNOSIS — J383 Other diseases of vocal cords: Secondary | ICD-10-CM | POA: Diagnosis not present

## 2019-03-18 DIAGNOSIS — C329 Malignant neoplasm of larynx, unspecified: Secondary | ICD-10-CM | POA: Diagnosis not present

## 2019-03-18 DIAGNOSIS — H9202 Otalgia, left ear: Secondary | ICD-10-CM | POA: Diagnosis not present

## 2019-03-18 DIAGNOSIS — Z48298 Encounter for aftercare following other organ transplant: Secondary | ICD-10-CM | POA: Diagnosis not present

## 2019-03-18 DIAGNOSIS — Z483 Aftercare following surgery for neoplasm: Secondary | ICD-10-CM | POA: Diagnosis not present

## 2019-03-18 DIAGNOSIS — Z4801 Encounter for change or removal of surgical wound dressing: Secondary | ICD-10-CM | POA: Diagnosis not present

## 2019-03-18 DIAGNOSIS — R490 Dysphonia: Secondary | ICD-10-CM | POA: Diagnosis not present

## 2019-03-20 DIAGNOSIS — Z20828 Contact with and (suspected) exposure to other viral communicable diseases: Secondary | ICD-10-CM | POA: Diagnosis not present

## 2019-03-20 DIAGNOSIS — J449 Chronic obstructive pulmonary disease, unspecified: Secondary | ICD-10-CM | POA: Diagnosis not present

## 2019-03-20 DIAGNOSIS — Z79899 Other long term (current) drug therapy: Secondary | ICD-10-CM | POA: Diagnosis not present

## 2019-03-20 DIAGNOSIS — J383 Other diseases of vocal cords: Secondary | ICD-10-CM | POA: Diagnosis not present

## 2019-03-20 DIAGNOSIS — R490 Dysphonia: Secondary | ICD-10-CM | POA: Diagnosis not present

## 2019-03-20 DIAGNOSIS — L988 Other specified disorders of the skin and subcutaneous tissue: Secondary | ICD-10-CM | POA: Diagnosis not present

## 2019-03-20 DIAGNOSIS — Z85820 Personal history of malignant melanoma of skin: Secondary | ICD-10-CM | POA: Diagnosis not present

## 2019-03-20 DIAGNOSIS — Z9889 Other specified postprocedural states: Secondary | ICD-10-CM | POA: Diagnosis not present

## 2019-03-20 DIAGNOSIS — Z431 Encounter for attention to gastrostomy: Secondary | ICD-10-CM | POA: Diagnosis not present

## 2019-03-20 DIAGNOSIS — Z48298 Encounter for aftercare following other organ transplant: Secondary | ICD-10-CM | POA: Diagnosis not present

## 2019-03-20 DIAGNOSIS — Z4803 Encounter for change or removal of drains: Secondary | ICD-10-CM | POA: Diagnosis not present

## 2019-03-20 DIAGNOSIS — M542 Cervicalgia: Secondary | ICD-10-CM | POA: Diagnosis not present

## 2019-03-20 DIAGNOSIS — R509 Fever, unspecified: Secondary | ICD-10-CM | POA: Diagnosis not present

## 2019-03-20 DIAGNOSIS — K122 Cellulitis and abscess of mouth: Secondary | ICD-10-CM | POA: Diagnosis not present

## 2019-03-20 DIAGNOSIS — Z791 Long term (current) use of non-steroidal anti-inflammatories (NSAID): Secondary | ICD-10-CM | POA: Diagnosis not present

## 2019-03-20 DIAGNOSIS — H9202 Otalgia, left ear: Secondary | ICD-10-CM | POA: Diagnosis not present

## 2019-03-20 DIAGNOSIS — Z4801 Encounter for change or removal of surgical wound dressing: Secondary | ICD-10-CM | POA: Diagnosis not present

## 2019-03-20 DIAGNOSIS — C329 Malignant neoplasm of larynx, unspecified: Secondary | ICD-10-CM | POA: Diagnosis not present

## 2019-03-20 DIAGNOSIS — Z9002 Acquired absence of larynx: Secondary | ICD-10-CM | POA: Diagnosis not present

## 2019-03-20 DIAGNOSIS — Z483 Aftercare following surgery for neoplasm: Secondary | ICD-10-CM | POA: Diagnosis not present

## 2019-03-20 DIAGNOSIS — Z7982 Long term (current) use of aspirin: Secondary | ICD-10-CM | POA: Diagnosis not present

## 2019-03-21 DIAGNOSIS — R221 Localized swelling, mass and lump, neck: Secondary | ICD-10-CM | POA: Diagnosis not present

## 2019-03-21 DIAGNOSIS — R509 Fever, unspecified: Secondary | ICD-10-CM | POA: Diagnosis not present

## 2019-03-21 DIAGNOSIS — R918 Other nonspecific abnormal finding of lung field: Secondary | ICD-10-CM | POA: Diagnosis not present

## 2019-03-23 MED ORDER — Medication
Status: DC
Start: 2019-03-23 — End: 2019-03-23

## 2019-03-23 MED ORDER — GENERIC EXTERNAL MEDICATION
Status: DC
Start: 2019-03-24 — End: 2019-03-23

## 2019-03-23 MED ORDER — ENOXAPARIN SODIUM 40 MG/0.4ML ~~LOC~~ SOLN
40.00 | SUBCUTANEOUS | Status: DC
Start: 2019-03-24 — End: 2019-03-23

## 2019-03-23 MED ORDER — DOCUSATE SODIUM 100 MG PO CAPS
100.00 | ORAL_CAPSULE | ORAL | Status: DC
Start: ? — End: 2019-03-23

## 2019-03-23 MED ORDER — ASPIRIN 325 MG PO TABS
325.00 | ORAL_TABLET | ORAL | Status: DC
Start: 2019-03-24 — End: 2019-03-23

## 2019-03-23 MED ORDER — Medication
5.00 | Status: DC
Start: ? — End: 2019-03-23

## 2019-03-23 MED ORDER — POLYETHYLENE GLYCOL 3350 17 G PO PACK
17.00 | PACK | ORAL | Status: DC
Start: ? — End: 2019-03-23

## 2019-03-23 MED ORDER — BISACODYL 10 MG RE SUPP
10.00 | RECTAL | Status: DC
Start: ? — End: 2019-03-23

## 2019-03-23 MED ORDER — GENERIC EXTERNAL MEDICATION
40.00 | Status: DC
Start: 2019-03-24 — End: 2019-03-23

## 2019-03-23 MED ORDER — GENERIC EXTERNAL MEDICATION
1.50 | Status: DC
Start: 2019-03-23 — End: 2019-03-23

## 2019-03-23 MED ORDER — MELATONIN 3 MG PO TABS
6.00 | ORAL_TABLET | ORAL | Status: DC
Start: ? — End: 2019-03-23

## 2019-03-23 MED ORDER — ACETAMINOPHEN 500 MG PO TABS
1000.00 | ORAL_TABLET | ORAL | Status: DC
Start: 2019-03-24 — End: 2019-03-23

## 2019-03-23 MED ORDER — ONDANSETRON HCL 4 MG/2ML IJ SOLN
4.00 | INTRAMUSCULAR | Status: DC
Start: ? — End: 2019-03-23

## 2019-03-23 MED ORDER — CELECOXIB 200 MG PO CAPS
200.00 | ORAL_CAPSULE | ORAL | Status: DC
Start: 2019-03-23 — End: 2019-03-23

## 2019-03-25 ENCOUNTER — Telehealth: Payer: Self-pay | Admitting: Radiation Oncology

## 2019-03-25 DIAGNOSIS — R490 Dysphonia: Secondary | ICD-10-CM | POA: Diagnosis not present

## 2019-03-25 DIAGNOSIS — L988 Other specified disorders of the skin and subcutaneous tissue: Secondary | ICD-10-CM | POA: Diagnosis not present

## 2019-03-25 DIAGNOSIS — C329 Malignant neoplasm of larynx, unspecified: Secondary | ICD-10-CM | POA: Diagnosis not present

## 2019-03-25 NOTE — Telephone Encounter (Signed)
New message: ° ° °LVM for patient to return call to schedule from referral received. °

## 2019-03-30 DIAGNOSIS — Z483 Aftercare following surgery for neoplasm: Secondary | ICD-10-CM | POA: Diagnosis not present

## 2019-03-30 DIAGNOSIS — Z4803 Encounter for change or removal of drains: Secondary | ICD-10-CM | POA: Diagnosis not present

## 2019-03-30 DIAGNOSIS — C329 Malignant neoplasm of larynx, unspecified: Secondary | ICD-10-CM | POA: Diagnosis not present

## 2019-03-30 DIAGNOSIS — Z48298 Encounter for aftercare following other organ transplant: Secondary | ICD-10-CM | POA: Diagnosis not present

## 2019-03-30 DIAGNOSIS — J383 Other diseases of vocal cords: Secondary | ICD-10-CM | POA: Diagnosis not present

## 2019-03-30 DIAGNOSIS — R490 Dysphonia: Secondary | ICD-10-CM | POA: Diagnosis not present

## 2019-03-30 DIAGNOSIS — M542 Cervicalgia: Secondary | ICD-10-CM | POA: Diagnosis not present

## 2019-03-30 DIAGNOSIS — H9202 Otalgia, left ear: Secondary | ICD-10-CM | POA: Diagnosis not present

## 2019-03-30 DIAGNOSIS — Z4801 Encounter for change or removal of surgical wound dressing: Secondary | ICD-10-CM | POA: Diagnosis not present

## 2019-03-30 DIAGNOSIS — Z431 Encounter for attention to gastrostomy: Secondary | ICD-10-CM | POA: Diagnosis not present

## 2019-03-31 ENCOUNTER — Telehealth: Payer: Self-pay | Admitting: *Deleted

## 2019-03-31 NOTE — Telephone Encounter (Addendum)
Oncology Nurse Navigator Documentation  Placed introductory call to new referral patient Ms. Melody Ward.  Spoke with dtr Melody Ward, who asked I call back this afternoon as they are in car.  Addendum 1619:  Spoke with dtr, further introduced my self, provided contact information.  She confirmed understanding of referral by [name] for her mom to see [name] to discuss post-surgical RT.  I explained she can expect call from [name] to arrange video/telephone consult.  She voiced understanding/appreciation.   Gayleen Orem, RN, BSN Head & Neck Oncology Nurse [place] at [place] [phone]

## 2019-04-01 DIAGNOSIS — Z483 Aftercare following surgery for neoplasm: Secondary | ICD-10-CM | POA: Diagnosis not present

## 2019-04-01 DIAGNOSIS — R490 Dysphonia: Secondary | ICD-10-CM | POA: Diagnosis not present

## 2019-04-01 DIAGNOSIS — C329 Malignant neoplasm of larynx, unspecified: Secondary | ICD-10-CM | POA: Diagnosis not present

## 2019-04-01 NOTE — Progress Notes (Signed)
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 LEFT HYPOPHARYNX, BIOPSY:    No malignancy identified. B. LEFT SUPERIOR PHARYNX, BIOPSY:    No malignancy identified. C. LEFT TONGUE BASE, BIOPSY:    No malignancy identified. D. LARYNX AND LEFT PHARYNX, TOTAL LARYNGECTOMY WITH LEFT PHARYNGECTOMY:    Invasive squamous cell carcinoma, moderately differentiated,      nonkeratinizing.    Tumor size: 2 cm.    Tumor involves paraglottic space.    Perineural invasion identified.    Margins negative for malignancy.    Pathologic stage: pT3 pN1.    See tumor protocol summary below. E. LYMPH NODES, RIGHT NECK LEVELS 2A, 3 AND 4, EXCISION:    Twelve lymph nodes, negative for metastasis (0/12). F. LYMPH NODES, RIGHT NECK LEVELS 2B, EXCISION:    Ten lymph nodes, negative for metastasis (0/10). G. LYMPH NODES, LEFT NECK LEVELS 2A, 3 AND 4, EXCISION:    Metastatic carcinoma involving 1 of 19 lymph nodes (1/19).    Tumor deposit size: 0.4 cm.    No extranodal extension. H. LYMPH NODES, LEFT NECK 2B, EXCISION:    Six lymph nodes, negative for metastasis (0/6).  Patient presented about a year ago with symptoms of: progressive intermittent hoarseness and odynophagia.   Biopsies of Larynx and left pharynx revealed: Invasive squamous cell carcinoma, moderately differentiated.   Nutrition Status Yes No Comments  Weight changes? [x]  []    Swallowing concerns? [x]  []  She has been told that she can begin trying to swallow water. She has not attempted this yet.   PEG? [x]  [x]  She has a nasogastric feeding tube in place, and is doing bolus feedings   Referrals Yes No Comments  Social Work? []  [x]    Dentistry? []  [x]    Swallowing therapy? [x]  []   [facility]  Nutrition? [x]  []  While inpatient at [facility]  Med/Onc? []  [x]     Safety Issues Yes No Comments  Prior radiation? []  [x]    Pacemaker/ICD? []  [x]    Possible current pregnancy? []  [x]    Is the patient on methotrexate? []  [x]     Tobacco/Marijuana/Snuff/ETOH use: She tells me she has never smoked. She does drink alcohol occasionally  Past/Anticipated interventions by otolaryngology, if any:  03/06/19 Dr. Nicolette Bang [name] Procedures/Surgeries performed during hospitalization:  LARYNGECTOMY TOTAL, pharyngectomy, bilateral neck dissection (Bilateral Neck) NECK DISSECTION MODIFIED RADICAL <6HRS (N/A Neck) FREE FLAP RADIAL FOREARM vs parascapular vs latissimus dorsi flap, pharyngoplasty, voice prosthesis/TEP placement, nerve graft (Left Arm Lower)   04/01/19 Dr. Nicolette Bang Impression  s/p laryngopharyngectomy, BND, and TEP, with a RFFF reconstruction ([name]) on 03/06/19 . Pathology with 2 cm tumor, negative margins, +PNI, no LVI, and 1/47 nodes ( 4 mm, left neck, no ENE).  She has had some healing challenges but currently doing well. She is very close to closing this fistula and the neck is much-improved from last week.  Wound care : continue wet-to-dry, pack a little gentler and less deeply now Diet - NPO for now, but can sip water freely.  She has an appt with [name] next week. I can see her then.  Will set up appt with [name] to discuss adjuvant RT She is agreeable with this plan. All her questions were answered.   04/08/19 [name] and Dr. Nicolette Bang and Speech Therapy with [name].   Past/Anticipated interventions by medical oncology, if any:  None scheduled at time of note.    Current Complaints / other details:   She saw Dr. Margarette Canada (Radiation Oncology at [facility]) on 02/04/19 for initial consult.  She saw Dr. Casimer Leek (Radiation Oncology at [facility]) on 02/12/19 for consultation.

## 2019-04-02 ENCOUNTER — Ambulatory Visit
Admission: RE | Admit: 2019-04-02 | Discharge: 2019-04-02 | Disposition: A | Discharge status: Home / Self Care | Payer: Self-pay | Source: Ambulatory Visit | Attending: Radiation Oncology | Admitting: Radiation Oncology

## 2019-04-02 ENCOUNTER — Telehealth: Payer: Self-pay | Admitting: *Deleted

## 2019-04-02 ENCOUNTER — Other Ambulatory Visit: Payer: Self-pay | Admitting: *Deleted

## 2019-04-02 DIAGNOSIS — C329 Malignant neoplasm of larynx, unspecified: Secondary | ICD-10-CM | POA: Diagnosis not present

## 2019-04-02 DIAGNOSIS — Z4803 Encounter for change or removal of drains: Secondary | ICD-10-CM | POA: Diagnosis not present

## 2019-04-02 DIAGNOSIS — H9202 Otalgia, left ear: Secondary | ICD-10-CM | POA: Diagnosis not present

## 2019-04-02 DIAGNOSIS — R490 Dysphonia: Secondary | ICD-10-CM | POA: Diagnosis not present

## 2019-04-02 DIAGNOSIS — J383 Other diseases of vocal cords: Secondary | ICD-10-CM | POA: Diagnosis not present

## 2019-04-02 DIAGNOSIS — Z4801 Encounter for change or removal of surgical wound dressing: Secondary | ICD-10-CM | POA: Diagnosis not present

## 2019-04-02 DIAGNOSIS — Z48298 Encounter for aftercare following other organ transplant: Secondary | ICD-10-CM | POA: Diagnosis not present

## 2019-04-02 DIAGNOSIS — Z483 Aftercare following surgery for neoplasm: Secondary | ICD-10-CM | POA: Diagnosis not present

## 2019-04-02 DIAGNOSIS — Z431 Encounter for attention to gastrostomy: Secondary | ICD-10-CM | POA: Diagnosis not present

## 2019-04-02 DIAGNOSIS — M542 Cervicalgia: Secondary | ICD-10-CM | POA: Diagnosis not present

## 2019-04-02 NOTE — Telephone Encounter (Signed)
Oncology Nurse Navigator Documentation  Fax sent to [facility] Radiology Imaging Library requesting the following post-surgical imaging be pushed to Power Share:  03/21/2019 CT Neck/Thyroid    03/21/2019 CT Chest Notification of successful fax transmission received. Orders placed for assignment to PACS Timeline.  Request for pre-surgical imaging pending.  Gayleen Orem, RN, BSN Head & Neck Oncology Nurse [place] at [place] [phone]

## 2019-04-03 ENCOUNTER — Encounter: Payer: Self-pay | Admitting: Radiation Oncology

## 2019-04-03 ENCOUNTER — Encounter: Payer: Self-pay | Admitting: Family Medicine

## 2019-04-03 ENCOUNTER — Ambulatory Visit
Admission: RE | Admit: 2019-04-03 | Discharge: 2019-04-03 | Disposition: A | Discharge status: Home / Self Care | Payer: Self-pay | Source: Ambulatory Visit | Attending: Radiation Oncology | Admitting: Radiation Oncology

## 2019-04-03 ENCOUNTER — Telehealth: Payer: Self-pay | Admitting: Nutrition

## 2019-04-03 ENCOUNTER — Ambulatory Visit
Admission: RE | Admit: 2019-04-03 | Discharge: 2019-04-03 | Disposition: A | Discharge status: Home / Self Care | Payer: Medicare HMO | Source: Ambulatory Visit | Attending: Radiation Oncology | Admitting: Radiation Oncology

## 2019-04-03 ENCOUNTER — Other Ambulatory Visit: Payer: Self-pay

## 2019-04-03 ENCOUNTER — Other Ambulatory Visit: Payer: Self-pay | Admitting: *Deleted

## 2019-04-03 DIAGNOSIS — R5381 Other malaise: Secondary | ICD-10-CM

## 2019-04-03 DIAGNOSIS — C329 Malignant neoplasm of larynx, unspecified: Secondary | ICD-10-CM

## 2019-04-03 DIAGNOSIS — Z1329 Encounter for screening for other suspected endocrine disorder: Secondary | ICD-10-CM

## 2019-04-03 DIAGNOSIS — C320 Malignant neoplasm of glottis: Secondary | ICD-10-CM | POA: Diagnosis not present

## 2019-04-03 DIAGNOSIS — C770 Secondary and unspecified malignant neoplasm of lymph nodes of head, face and neck: Secondary | ICD-10-CM | POA: Diagnosis not present

## 2019-04-03 DIAGNOSIS — Z9049 Acquired absence of other specified parts of digestive tract: Secondary | ICD-10-CM | POA: Diagnosis not present

## 2019-04-03 NOTE — Telephone Encounter (Signed)
Scheduled appt per 12/11 sch message for nutrition apt. Pt is aware of appt

## 2019-04-03 NOTE — Telephone Encounter (Signed)
Called the number pt left for [name] and it hung up on me too. Its a recording and says operator not available and enter your extension number. I called pt's daughter to try and figure out what it going on and she states she had appt today with [name] and then her office called her and her mother meant to send this message to [name] not dr Bonnye Fava and she will help her mom tonight send it to [name].

## 2019-04-03 NOTE — Progress Notes (Signed)
Dental Form with Estimates of Radiation Dose      Diagnosis: Laryngeal cancer  Prognosis: curable  Anticipated # of fractions: 30    Daily?: yes  # of weeks of radiotherapy: 6  Chemotherapy?: no  Anticipated xerostomia:  Mild permanent   Pre-simulation needs:   Scatter protection if feasible before a simulation 12/18; no extractions, please.  Simulation: ASAP     Other Notes: need to simulate by PM of 12/18. Can you evaluate and place scatter guards by then?  Please contact Eppie Gibson, MD, with patient's disposition after evaluation and/or dental treatment.   -----------------------------------  Eppie Gibson, MD

## 2019-04-03 NOTE — Progress Notes (Signed)
Radiation Oncology         [phone] ________________________________  Initial outpatient MyChart Video Consultation  Name: Melody Ward MRN: [id number]  Date: 04/03/2019  DOB: June 25, 1950  [id number], Grace Bushy, MD  [name], MD   REFERRING PHYSICIAN: [name], MD  DIAGNOSIS:    ICD-10-CM   1. Screening for hypothyroidism  Z13.29 TSH    T4, free  2. Laryngeal cancer (HCC)  C32.9 TSH    T4, free    Ambulatory referral to Physical Therapy    Amb Referral to Nutrition and Diabetic E    Referral to Neuro Rehab    Ambulatory referral to Dentistry    Ambulatory referral to Social Work  3. 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 female who presented with a history of voice hoarseness. She initially developed this in 04/2017 but did not begin to have an issue with it until 06/2018 following an upper respiratory infection.    Subsequently, the patient saw Dr. Benjamine Mola who performed laryngoscopy on 10/27/2018. This showed: diffuse laryngeal erythema, worse on the left side; posterior laryngeal area also moderately edematous; no suspicious mass or lesion noted. She was treated for laryngopharyngeal reflux with no relief.   She was subsequently referred to Dr. Rowe Clack on 01/20/2019. Laryngoscopy performed at that time revealed: paralyzed left true vocal cord; mass with extension into left subglottis and on left pyriform sinus. She underwent neck/thyroid CT at [facility] on 01/29/2019, which revealed: 34 mm transglottic laryngeal mass invades the left greater than right epiglottis, pharyngoepiglottic folds, aryepiglottic folds,  vocal cords, anterior commissure and left pyriform sinus, lateral and posterior pharyngeal walls, and subglottic larynx; no evidence of extralaryngeal extension; new asymmetric sclerosis of the left arytenoid cartilage suspicious for neoplastic involvement; 8 mm right level III node suspicious for nodal metastasis.  Biopsy of the laryngeal mass performed that day revealed: invasive squamous cell carcinoma, moderately to poorly differentiated; positive for p40, negative for p16.  She was then referred to [name] on 02/04/2019, who recommended surgery. Subsequently the patient saw Dr. Nicolette Bang on 03/03/2019 and proceeded to total laryngectomy, partial left pharyngectomy, and total neck dissection on 03/06/2019. Pathology from the procedure revealed: tumor size of 2 cm; tumor involves paraglottic space; perineural invasion identified; margins negative; nodal status of one positive for metastatic carcinoma (1/47). ENE neg, LVSI neg.  Pertinent imaging thus far includes chest CT performed on 03/21/2019 revealing aside from surgical changes, no acute CT abnormalities. Neck CT performed the same day showed: extensive postsurgical changes.     Nutrition Status Yes No Comments  Weight changes? [x]  []    Swallowing concerns? [x]  []  She has been told that she can begin trying to swallow water. She has not attempted this yet.   PEG? [x]  [x]  She has a nasogastric feeding tube in place, and is doing bolus feedings   Referrals Yes No Comments  Social Work? []  [x]    Dentistry? []  [x]    Swallowing therapy? [x]  []  [facility]  Nutrition? [x]  []  While inpatient at [facility]  Med/Onc? []  [x]     Safety Issues Yes No Comments  Prior radiation? []  [  x]   Pacemaker/ICD? []  [x]    Possible current pregnancy? []  [x]    Is the patient on methotrexate? []  [x]     Tobacco/Marijuana/Snuff/ETOH use: She tells me she has never smoked. She does drink alcohol occasionally    She is still healing, recovering from fistula in neck, with  wound dressings.  PATH: ACCESSION NUMBERNG:[id number] RECEIVED: 03/06/2019 ORDERING PHYSICIAN: Theodoro Kalata , MD PATIENT NAME: Ward, Melody LOVE SURGICAL PATHOLOGY REPORT  FINAL PATHOLOGIC DIAGNOSIS MICROSCOPIC EXAMINATION AND DIAGNOSIS  A. LEFT HYPOPHARYNX, BIOPSY:    No malignancy identified.  B. LEFT SUPERIOR PHARYNX, BIOPSY:    No malignancy identified.  C. LEFT TONGUE BASE, BIOPSY:    No malignancy identified.  D. LARYNX AND LEFT PHARYNX, TOTAL LARYNGECTOMY WITH LEFT PHARYNGECTOMY:    Invasive squamous cell carcinoma, moderately differentiated,      nonkeratinizing.    Tumor size: 2 cm.    Tumor involves paraglottic space.    Perineural invasion identified.    Margins negative for malignancy.    Pathologic stage: pT3 pN1.    See tumor protocol summary below.  E. LYMPH NODES, RIGHT NECK LEVELS 2A, 3 AND 4, EXCISION:    Twelve lymph nodes, negative for metastasis (0/12).  F. LYMPH NODES, RIGHT NECK LEVELS 2B, EXCISION:    Ten lymph nodes, negative for metastasis (0/10).  G. LYMPH NODES, LEFT NECK LEVELS 2A, 3 AND 4, EXCISION:    Metastatic carcinoma involving 1 of 19 lymph nodes (1/19).    Tumor deposit size: 0.4 cm.    No extranodal extension.  H. LYMPH NODES, LEFT NECK 2B, EXCISION:    Six lymph nodes, negative for metastasis (0/6).   Surgical Pathology Cancer Case Summary (AJCC 8th Edition): LARYNX (SUPRAGLOTTIS, GLOTTIS, SUBGLOTTIS)  CAP Protocol posting date: June 2017 Version: Larynx 4.0.0.1  PROCEDURE: Total laryngectomy, partial left pharyngectomy TUMOR SITE: Larynx, supraglottis and glottis TRANSGLOTTIC EXTENSION: Present TUMOR LATERALITY: Left, Midline, Right TUMOR FOCALITY: Unifocal TUMOR SIZE: GREATEST DIMENSION: 2 cm HISTOLOGIC TYPE: Squamous cell carcinoma, nonkeratinizing HISTOLOGIC GRADE: G2: Moderately differentiated TUMOR EXTENSION: Paraglottic  space MARGINS: Uninvolved by invasive tumor LYMPHOVASCULAR INVASION: Not identified PERINEURAL INVASION: Present REGIONAL LYMPH NODES:  NUMBER OF LYMPH NODES INVOLVED: 1  NUMBER OF LYMPH NODES EXAMINED: 67  LATERALITY OF LYMPH NODES INVOLVED: Ipsilateral  SIZE OF LARGEST METASTATIC DEPOSIT: 0.4 cm  EXTRANODAL EXTENSION: Not identified PATHOLOGIC STAGE CLASSIFICATION (pTNM, AJCC 8TH Ed): pT3 pN1 pT3: Tumor limited to larynx with vocal cord fixation and/or invades any of the following: postcricoid area, preepiglottic space, paraglottic space and/or inner cortex of thyroid cartilage pN1: Metastasis in a single ipsilateral lymph node, 3 cm or smaller in greatest dimension and ENE(-)  PREVIOUS RADIATION THERAPY: No  PAST MEDICAL HISTORY:  has a past medical history of melanoma (1990).    PAST SURGICAL HISTORY: Past Surgical History:  Procedure Laterality Date  . CESAREAN SECTION    . FREE FLAP RADIAL FOREARM  03/06/2019   Dr. Nicolette Bang [facility]  . MODIFIED RADICAL NECK DISSECTION  03/06/2019  . NECK DISSECTION  03/06/2019   bilateral neck dissection  . NERVE GRAFT Left 03/06/2019   left arm lower, Dr. Nicolette Bang at [facility]  . PHARYNGECTOMY  03/06/2019   Dr. Nicolette Bang at [facility]  . TOTAL LARYNGECTOMY  03/06/2019   Dr. Nicolette Bang at [facility]  . 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 Nicolette Bang [facility]  . WRIST SURGERY  06/06/2018   ORIF distal radius fracture    FAMILY HISTORY: family history is not  on file.  SOCIAL HISTORY:  reports that she has never smoked. She has never used smokeless tobacco. She reports current alcohol use. She reports previous drug use.  ALLERGIES: Patient has no known allergies.  MEDICATIONS:  Current Outpatient Medications  Medication Sig Dispense Refill  . acetaminophen (TYLENOL) 500 MG tablet Take by mouth.    . celecoxib (CELEBREX) 200 MG capsule     . pantoprazole sodium (PROTONIX) 40 mg/20 mL PACK Give 20  mLs by tube 2 (two) times daily.    . polyethylene glycol powder (GLYCOLAX/MIRALAX) 17 GM/SCOOP powder 17 g by Per NG tube route daily.    Marland Kitchen oxyCODONE (OXY IR/ROXICODONE) 5 MG immediate release tablet     . traMADol (ULTRAM) 50 MG tablet      No current facility-administered medications for this encounter.    REVIEW OF SYSTEMS:  Notable for that above.   PHYSICAL EXAM:  vitals were not taken for this visit.   General: Alert and oriented, in no acute distress  Psychiatric: Judgment and insight are intact. Affect is appropriate.    LABORATORY DATA:  Lab Results  Component Value Date   WBC 7.6 12/21/2006   HGB 12.2 12/21/2006   HCT 35.3 (L) 12/21/2006   MCV 92.8 12/21/2006   PLT 162 12/21/2006   CMP     Component Value Date/Time   NA 143 03/29/2016 1429   K 3.9 03/29/2016 1429   CL 101 03/29/2016 1429   CO2 25 03/29/2016 1429   GLUCOSE 91 03/29/2016 1429   GLUCOSE 127 (H) 12/21/2006 2340   BUN 9 03/29/2016 1429   CREATININE 0.82 03/29/2016 1429   CALCIUM 9.6 03/29/2016 1429   PROT 7.1 03/29/2016 1429   ALBUMIN 4.8 03/29/2016 1429   AST 34 03/29/2016 1429   ALT 33 (H) 03/29/2016 1429   ALKPHOS 75 03/29/2016 1429   BILITOT 0.8 03/29/2016 1429   GFRNONAA 76 03/29/2016 1429   GFRAA 88 03/29/2016 1429      Lab Results  Component Value Date   TSH 1.530 03/29/2016     RADIOGRAPHY:  As above    IMPRESSION/PLAN:  This is a delightful patient with head and neck cancer. I do recommend adjuvant radiotherapy for this patient.  We discussed the potential risks, benefits, and side effects of radiotherapy. We talked in detail about acute and late effects. We discussed that some of the most bothersome acute effects may be mucositis, dysgeusia, salivary changes, skin irritation, hair loss, dehydration, weight loss and fatigue. We talked about late effects which include but are not necessarily limited to dysphagia, hypothyroidism, nerve injury, spinal cord injury, xerostomia,  trismus, and neck edema. No guarantees of treatment were given. The patient is enthusiastic about proceeding with treatment. I look forward to participating in the patient's care.    Simulation (treatment planning) will take place in about a week.  I've emailed her surgeons to ask them to let me know when she will be released for start of treatment given healing issues.  We also discussed that the treatment of head and neck cancer is a multidisciplinary process to maximize treatment outcomes and quality of life. For this reason the following referrals have been or will be made:   Dentistry for dental evaluation, advice on reducing risk of cavities, osteoradionecrosis, or other oral issues.   Nutritionist for nutrition support during and after treatment.   Speech language pathology for swallowing and/or speech therapy.   Social work for social support.    Physical therapy due  to risk of lymphedema in neck and deconditioning.   Baseline labs including TSH.  This encounter was provided by telemedicine platform MyChart Video The patient has given verbal consent for this type of encounter and has been advised to only accept a meeting of this type in a secure network environment. The time spent during this encounter was over 35 minutes. The attendants for this meeting include Eppie Gibson  and QADRIYYAH BERGSMA.  During the encounter, Eppie Gibson was located at [facility] Radiation Oncology Department.  Gregery Na Osmun was located at home.   __________________________________________   Eppie Gibson, MD   This document serves as a record of services personally performed by Eppie Gibson, MD. It was created on her behalf by Wilburn Mylar, a trained medical scribe. The creation of this record is based on the scribe's personal observations and the provider's statements to them. This document has been checked and approved by the attending provider.

## 2019-04-06 ENCOUNTER — Encounter: Payer: Self-pay | Admitting: Radiation Oncology

## 2019-04-06 DIAGNOSIS — C320 Malignant neoplasm of glottis: Secondary | ICD-10-CM | POA: Insufficient documentation

## 2019-04-07 ENCOUNTER — Encounter: Payer: Self-pay | Admitting: *Deleted

## 2019-04-07 DIAGNOSIS — J383 Other diseases of vocal cords: Secondary | ICD-10-CM | POA: Diagnosis not present

## 2019-04-07 DIAGNOSIS — R490 Dysphonia: Secondary | ICD-10-CM | POA: Diagnosis not present

## 2019-04-07 DIAGNOSIS — H9202 Otalgia, left ear: Secondary | ICD-10-CM | POA: Diagnosis not present

## 2019-04-07 DIAGNOSIS — Z4803 Encounter for change or removal of drains: Secondary | ICD-10-CM | POA: Diagnosis not present

## 2019-04-07 DIAGNOSIS — C329 Malignant neoplasm of larynx, unspecified: Secondary | ICD-10-CM | POA: Diagnosis not present

## 2019-04-07 DIAGNOSIS — Z4801 Encounter for change or removal of surgical wound dressing: Secondary | ICD-10-CM | POA: Diagnosis not present

## 2019-04-07 DIAGNOSIS — Z431 Encounter for attention to gastrostomy: Secondary | ICD-10-CM | POA: Diagnosis not present

## 2019-04-07 DIAGNOSIS — M542 Cervicalgia: Secondary | ICD-10-CM | POA: Diagnosis not present

## 2019-04-07 DIAGNOSIS — Z48298 Encounter for aftercare following other organ transplant: Secondary | ICD-10-CM | POA: Diagnosis not present

## 2019-04-07 DIAGNOSIS — Z483 Aftercare following surgery for neoplasm: Secondary | ICD-10-CM | POA: Diagnosis not present

## 2019-04-07 NOTE — Progress Notes (Signed)
Oncology Nurse Navigator Documentation  Met with Melody Ward during [place] video initial consult with [name].  She was accompanied by her dtr Melody Ward.    . Further introduced myself as her Navigator, explained my role as a member of the Care Team.   . Provided introductory explanation of radiation treatment including SIM planning and purpose of Aquaplast head and shoulder mask, showed them example.   . Of note: . Total laryngectomy 11/13 with Drs. Waltonen/Patwa. . She has post-surgical follow-up next Wed to include evaluation of fistula. . She is using electrolarynx, positioning R corner of mouth. . Not being followed by SLP at [facility]. . They voiced understanding of plan for 6 weeks M-F RT, CT SIM to be scheduled for next week.  I encouraged them to contact me with questions/concerns as treatments/procedures begin.   Melody Orem, RN, BSN Head & Neck Oncology Nurse [place] at [place] [phone]

## 2019-04-08 ENCOUNTER — Other Ambulatory Visit (HOSPITAL_COMMUNITY): Payer: Medicare HMO | Admitting: Dentistry

## 2019-04-08 DIAGNOSIS — Z48813 Encounter for surgical aftercare following surgery on the respiratory system: Secondary | ICD-10-CM | POA: Diagnosis not present

## 2019-04-08 DIAGNOSIS — R490 Dysphonia: Secondary | ICD-10-CM | POA: Diagnosis not present

## 2019-04-08 DIAGNOSIS — C329 Malignant neoplasm of larynx, unspecified: Secondary | ICD-10-CM | POA: Diagnosis not present

## 2019-04-08 DIAGNOSIS — Z483 Aftercare following surgery for neoplasm: Secondary | ICD-10-CM | POA: Diagnosis not present

## 2019-04-08 NOTE — Progress Notes (Signed)
Has armband been applied?  Yes  Does patient have an allergy to IV contrast dye?: No   Has patient ever received premedication for IV contrast dye?: N/A  Does patient take metformin?: No  If patient does take metformin when was the last dose: N/A  Date of lab work: 03/23/19 at [facility] BUN: 20 CR: 0.61 EGFR: >90  IV site: Right AC  Has IV site been added to flowsheet?  Yes  BP 97/68 (BP Location: Left Arm)   Pulse 85   Temp 98 F (36.7 C) (Tympanic)   Resp 18   Wt 118 lb 4.8 oz (53.7 kg)   SpO2 100%   BMI 20.96 kg/m

## 2019-04-09 ENCOUNTER — Ambulatory Visit (HOSPITAL_COMMUNITY): Payer: Self-pay | Admitting: Dentistry

## 2019-04-09 ENCOUNTER — Encounter (HOSPITAL_COMMUNITY): Payer: Self-pay | Admitting: Dentistry

## 2019-04-09 ENCOUNTER — Inpatient Hospital Stay: Payer: Medicare HMO | Attending: Radiation Oncology | Admitting: Nutrition

## 2019-04-09 ENCOUNTER — Encounter: Payer: Self-pay | Admitting: *Deleted

## 2019-04-09 ENCOUNTER — Other Ambulatory Visit: Payer: Self-pay

## 2019-04-09 VITALS — BP 113/70 | HR 79 | Temp 98.8°F

## 2019-04-09 DIAGNOSIS — M2630 Unspecified anomaly of tooth position of fully erupted tooth or teeth: Secondary | ICD-10-CM

## 2019-04-09 DIAGNOSIS — C329 Malignant neoplasm of larynx, unspecified: Secondary | ICD-10-CM | POA: Diagnosis not present

## 2019-04-09 DIAGNOSIS — R252 Cramp and spasm: Secondary | ICD-10-CM

## 2019-04-09 DIAGNOSIS — K06010 Localized gingival recession, unspecified: Secondary | ICD-10-CM

## 2019-04-09 DIAGNOSIS — Z01818 Encounter for other preprocedural examination: Secondary | ICD-10-CM

## 2019-04-09 DIAGNOSIS — K0530 Chronic periodontitis, unspecified: Secondary | ICD-10-CM

## 2019-04-09 DIAGNOSIS — K08409 Partial loss of teeth, unspecified cause, unspecified class: Secondary | ICD-10-CM

## 2019-04-09 DIAGNOSIS — K036 Deposits [accretions] on teeth: Secondary | ICD-10-CM

## 2019-04-09 DIAGNOSIS — K031 Abrasion of teeth: Secondary | ICD-10-CM

## 2019-04-09 DIAGNOSIS — M264 Malocclusion, unspecified: Secondary | ICD-10-CM

## 2019-04-09 LAB — SURGICAL PATHOLOGY

## 2019-04-09 MED ORDER — SODIUM FLUORIDE 1.1 % DT CREA: TOPICAL_CREAM | DENTAL | 99 refills | Status: DC

## 2019-04-09 NOTE — Patient Instructions (Signed)

## 2019-04-09 NOTE — Progress Notes (Signed)
Oncology Nurse Navigator Documentation  Spoke with Melody Ward prior to appt with Nutritionist Dory Peru, confirmed her understanding of tomorrow's 2:15 appt for CT SIM.  Reviewed procedure for registration/arrival to Radiation Waiting.  She voiced understanding.  Gayleen Orem, RN, BSN Head & Neck Oncology Nurse [place] at [place] [phone]

## 2019-04-09 NOTE — Progress Notes (Signed)
DENTAL CONSULTATION  Date of Consultation:  04/09/2019 Patient Name:   Melody Ward Date of Birth:   [birth year] Medical Record Number: [id number]  COVID 19 SCREENING: The patient does not symptoms concerning for COVID-19 infection (Including fever, chills, cough, or new SHORTNESS OF BREATH).    VITALS: BP 113/70 (BP Location: Right Arm)   Pulse 79   Temp 98.8 F (37.1 C)   CHIEF COMPLAINT: Patient referred by [name] for dental consultation.  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  Patient underwent total laryngectomy, partial pharyngectomy, bilateral neck dissection and reconstruction with pectoralis flap on 03/06/2019 by Dr. Nicolette Bang and [name].  Patient with anticipated postoperative radiation therapy with [name].  Patient is now seen as part of a medically necessary preradiation therapy dental protocol examination.  The patient currently denies acute toothaches, swellings, or abscesses.  Patient last saw her primary dentist for an exam and cleaning prior to her surgery on February 17, 2019.   This was with  Dr. Thurnell Lose in [place], [place].  Patient usually sees the dentist on an every 63-month basis.  This, however,has been hampered by the COVID-19 pandemic.  Patient denies having partial dentures.  The patient denies having dental phobia.  PROBLEM LIST: Patient Active Problem List   Diagnosis Date Noted  . 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 Osteopenia 04/17/2016  . Sun-damaged skin 03/31/2016    PMH: Past Medical History:  Diagnosis Date  . melanoma 1990    PSH: Past Surgical History:  Procedure Laterality Date  . CESAREAN SECTION    . FREE FLAP RADIAL FOREARM  03/06/2019   Dr. Nicolette Bang [facility]  . MODIFIED RADICAL NECK DISSECTION  03/06/2019  . NECK DISSECTION  03/06/2019   bilateral neck dissection  . NERVE GRAFT Left 03/06/2019   left arm lower, Dr. Nicolette Bang at  [facility]  . PHARYNGECTOMY  03/06/2019   Dr. Nicolette Bang at [facility]  . TOTAL LARYNGECTOMY  03/06/2019   Dr. Nicolette Bang at [facility]  . 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 Nicolette Bang [facility]  . WRIST SURGERY  06/06/2018   ORIF distal radius fracture    ALLERGIES: No Known Allergies  MEDICATIONS: Current Outpatient Medications  Medication Sig Dispense Refill  . acetaminophen (TYLENOL) 500 MG tablet Take by mouth.    . celecoxib (CELEBREX) 200 MG capsule     . oxyCODONE (OXY IR/ROXICODONE) 5 MG immediate release tablet     . pantoprazole sodium (PROTONIX) 40 mg/20 mL PACK Give 20 mLs by tube 2 (two) times daily.    . polyethylene glycol powder (GLYCOLAX/MIRALAX) 17 GM/SCOOP powder 17 g by Per NG tube route daily.    . traMADol (ULTRAM) 50 MG tablet      No current facility-administered medications for this visit.    LABS: Lab Results  Component Value Date   WBC 7.6 12/21/2006   HGB 12.2 12/21/2006   HCT 35.3 (L) 12/21/2006   MCV 92.8 12/21/2006   PLT 162 12/21/2006      Component Value Date/Time   NA 143 03/29/2016 1429   K 3.9 03/29/2016 1429   CL 101 03/29/2016 1429   CO2 25 03/29/2016 1429   GLUCOSE 91 03/29/2016 1429   GLUCOSE 127 (H) 12/21/2006 2340   BUN 9 03/29/2016 1429   CREATININE 0.82 03/29/2016 1429   CALCIUM 9.6 03/29/2016 1429   GFRNONAA 76 03/29/2016 1429   GFRAA 88  03/29/2016 1429   No results found for: INR, PROTIME No results found for: PTT  SOCIAL HISTORY: Social History   Socioeconomic History  . Marital status: Married    Spouse name: Not on file  . Number of children: Not on file  . Years of education: Not on file  . Highest education level: Not on file  Occupational History  . Not on file  Tobacco Use  . Smoking status: Never Smoker  . Smokeless tobacco: Never Used  Substance and Sexual Activity  . Alcohol use: Yes    Comment: wine occ  . Drug use: Not Currently  . Sexual activity: Not on file  Other  Topics Concern  . Not on file  Social History Narrative  . Not on file   Social Determinants of Health   Financial Resource Strain:   . Difficulty of Paying Living Expenses: Not on file  Food Insecurity:   . Worried About Charity fundraiser in the Last Year: Not on file  . Ran Out of Food in the Last Year: Not on file  Transportation Needs:   . Lack of Transportation (Medical): Not on file  . Lack of Transportation (Non-Medical): Not on file  Physical Activity:   . Days of Exercise per Week: Not on file  . Minutes of Exercise per Session: Not on file  Stress:   . Feeling of Stress : Not on file  Social Connections:   . Frequency of Communication with Friends and Family: Not on file  . Frequency of Social Gatherings with Friends and Family: Not on file  . Attends Religious Services: Not on file  . Active Member of Clubs or Organizations: Not on file  . Attends Archivist Meetings: Not on file  . Marital Status: Not on file  Intimate Partner Violence:   . Fear of Current or Ex-Partner: Not on file  . Emotionally Abused: Not on file  . Physically Abused: Not on file  . Sexually Abused: Not on file    FAMILY HISTORY: History reviewed. No pertinent family history.  REVIEW OF SYSTEMS: Reviewed with the patient as per History of present illness. Psych: Patient denies having dental phobia.  DENTAL HISTORY: CHIEF COMPLAINT: Patient referred by [name] for dental consultation.  HPI: Melody Ward is a 68 year old female recently diagnosed with squamous cell carcinoma larynx.  Patient underwent total laryngectomy, partial pharyngectomy, bilateral neck dissection and reconstruction with pectoralis flap on 03/06/2019 by Dr. Nicolette Bang and [name].  Patient with anticipated postoperative radiation therapy with [name].  Patient is now seen as part of a medically necessary preradiation therapy dental protocol examination.  The patient currently denies acute  toothaches, swellings, or abscesses.  Patient last saw her primary dentist for an exam and cleaning prior to her surgery on February 17, 2019.   This was with  Dr. Thurnell Lose in [place], [place].  Patient usually sees the dentist on an every 61-month basis.  This, however,has been hampered by the COVID-19 pandemic.  Patient denies having partial dentures.  The patient denies having dental phobia.  DENTAL EXAMINATION: GENERAL: The patient is a well-developed, well-nourished female no acute distress. HEAD AND NECK: Patient has had previous right and left radical neck dissections.  Patient has an anterior neck consistent with a laryngectomy and healing stoma.  Patient has a decreased maximum interincisal opening of 27 mm.   INTRAORAL EXAM: Patient has normal saliva.  There is no evidence of oral abscess formation. DENTITION:  Patient is missing tooth numbers 1, 4, 16, 17, and 32.  There is an implant in the area of tooth #4.  The patient has multiple malpositioned teeth. The patient has multiple flexure lesions. PERIODONTAL: The patient has chronic periodontitis with plaque and calculus accumulations, gingival recession, and incipient to moderate bone loss. DENTAL CARIES/SUBOPTIMAL RESTORATIONS: No dental caries are noted at this time. ENDODONTIC: The patient currently denies acute pulpitis symptoms.  Patient has had previous root canal therapy associated with tooth numbers 18 and 19. CROWN AND BRIDGE: Patient has multiple crown restorations noted.  There is fractured lingual porcelain on the crown on tooth #18. PROSTHODONTIC: Patient denies having partial dentures. OCCLUSION: The patient has a poor occlusal scheme secondary to multiple missing teeth, multiple malpositioned teeth, and anterior crossbite in the area of tooth numbers 6 and 7 with #27.  The occlusion is stable, however.  RADIOGRAPHIC INTERPRETATION: An orthopantogram was taken.  A suboptimal full series was taken secondary to the  limited opening at this time. There are multiple missing teeth.  There is an implant in the area #4.  There are multiple crown restorations noted.  There are previous root canal therapies associated with tooth numbers 18 and 19.  There is incipient a moderate bone loss noted.   ASSESSMENTS: 1.  Squamous cell carcinoma of the larynx 2.  Status post laryngectomy and partial pharyngectomy with bilateral neck dissections and reconstructive surgery. 3.  Preradiation therapy dental protocol 4.  Multiple missing teeth 5.  Chronic periodontitis with bone loss 6.  Gingival recession 7.  Accretions 8.  Multiple flexure lesions 9.  Multiple missing teeth 10.  Multiple malpositioned teeth 11.  Anterior crossbite in the area of tooth numbers 6-7/27 12.  Poor occlusal scheme but a stable occlusion 13.  Decreased maximum interincisal opening  PLAN/RECOMMENDATIONS: 1. I discussed the risks, benefits, and complications of various treatment options with the patient in relationship to her medical and dental conditions, anticipated radiation therapy, and risk for radiation side effects to include xerostomia, radiation caries, trismus, mucositis, taste changes, gum and jawbone changes, and risk for osteoradionecrosis. We discussed various treatment options to include no treatment, extraction of teeth in the primary field of radiation therapy, alveoloplasty, pre-prosthetic surgery as indicated, periodontal therapy, dental restorations, root canal therapy, crown and bridge therapy, implant therapy, and replacement of missing teeth as indicated.  We also discussed fabrication of fluoride trays and scatter protection devices.  As there are no teeth in the primary field of radiation therapy, no extractions will be needed at this time.  Due to the limited opening, I am unable to obtain impressions to fabricate the fluoride trays and scatter protection devices.  A prescription for Prevident 5000+ was given to the patient  for use at bedtime with normal directions for brush on use.  Refills for 1 year were also provided.  This prescription was sent to [facility] in [place], [place].  The patient was instructed to contact the head and neck surgeons at [facility] to determine when she can start trismus exercises.  The patient is currently cleared for radiation therapy at this time.     2. Discussion of findings with medical team and coordination of future medical and dental care as needed.  I spent in excess of  120 minutes during the conduct of this consultation and >50% of this time involved direct face-to-face encounter for counseling and/or coordination of the patient's care.    Lenn Cal, DDS

## 2019-04-09 NOTE — Progress Notes (Signed)
68 year old female diagnosed with laryngeal cancer.  She is a patient of [name] to receive 6 weeks radiation therapy.  Past medical history includes melanoma  Medications include Protonix and MiraLAX.  Labs were reviewed.  Height: 5 feet 3 inches Weight: 116 pounds at [facility] per patient. Usual body weight: 135-140 pounds.  Patient is status post a total laryngectomy on November 13.  She had a total neck dissection.  She has a fistula.  An NG tube was placed and she was receiving bolus feedings however this has now been removed. Patient has been told to follow a full liquid diet for 2 days and then she can advance to soft foods. She will drink chocolate Ensure Enlive however she does not love it.  Nutrition diagnosis: Unintended weight loss related to laryngeal cancer and associated treatments as evidenced by no prior need for nutrition related information.  Intervention: Patient was educated to consume a liquid diet for 2 days and advance to soft diet per MD order. Educated patient on both full liquid and soft diet and provided education materials. Provided samples of Ensure Enlive along with coupons. Discouraged weight loss throughout treatment. Recommended small frequent meals and snacks with high-calorie, high-protein foods. Questions were answered.  Teach back method used.  Contact information given.  Monitoring, evaluation, goals: Patient will work to increase oral intake and minimize weight loss.  Next visit: To be followed weekly by telephone.  **Disclaimer: This note was dictated with voice recognition software. Similar sounding words can inadvertently be transcribed and this note may contain transcription errors which may not have been corrected upon publication of note.**

## 2019-04-10 ENCOUNTER — Encounter: Payer: Self-pay | Admitting: Radiation Oncology

## 2019-04-10 ENCOUNTER — Ambulatory Visit
Admission: RE | Admit: 2019-04-10 | Discharge: 2019-04-10 | Disposition: A | Discharge status: Home / Self Care | Payer: Medicare HMO | Source: Ambulatory Visit | Attending: Radiation Oncology | Admitting: Radiation Oncology

## 2019-04-10 ENCOUNTER — Encounter: Payer: Self-pay | Admitting: General Practice

## 2019-04-10 ENCOUNTER — Other Ambulatory Visit: Payer: Self-pay

## 2019-04-10 ENCOUNTER — Encounter (HOSPITAL_COMMUNITY): Payer: Self-pay | Admitting: Dentistry

## 2019-04-10 VITALS — BP 97/68 | HR 85 | Temp 98.0°F | Resp 18 | Wt 118.3 lb

## 2019-04-10 DIAGNOSIS — C320 Malignant neoplasm of glottis: Secondary | ICD-10-CM

## 2019-04-10 DIAGNOSIS — Z510 Encounter for antineoplastic radiation therapy: Secondary | ICD-10-CM | POA: Insufficient documentation

## 2019-04-10 MED ORDER — SODIUM CHLORIDE 0.9 % IV SOLN
Freq: Once | INTRAVENOUS | Status: DC
  Filled 2019-04-10: qty 250

## 2019-04-10 MED ORDER — SODIUM CHLORIDE 0.9% FLUSH
10.0000 mL | Freq: Once | INTRAVENOUS | Status: AC
  Administered 2019-04-10: 10 mL via INTRAVENOUS

## 2019-04-10 NOTE — Progress Notes (Signed)
Head and Neck Cancer Simulation, IMRT treatment planning note   Outpatient  Diagnosis:    ICD-10-CM   1. Malignant neoplasm of glottis ([place])  C32.0     The patient was taken to the CT simulator and identity was confirmed.  All relevant records and images related to the planned course of therapy were reviewed.  The patient freely provided informed written consent to proceed with treatment after reviewing the details related to the planned course of therapy. The consent form was witnessed and verified by the simulation staff.    The patient was laid in the supine position on the table. An Aquaplast head and shoulder mask was custom fitted to the patient's anatomy. High-resolution CT axial imaging was obtained of the head and neck with contrast. I verified that the quality of the imaging is good for treatment planning. 1 Medically Necessary Treatment Device was fabricated and supervised by me: Aquaplast mask.  Treatment planning note I plan to treat the patient with IMRT. I plan to treat the patient's tumor bed and bilateral neck nodes. I plan to treat to a total dose of 60 Gray in 30  fractions. Dose calculation was ordered from dosimetry.  IMRT planning Note  IMRT is medically necessary and an important modality to deliver adequate dose to the patient's at risk tissues while sparing the patient's normal structures, including the: esophagus, parotid tissue, mandible, brain stem, spinal cord, oral cavity, brachial plexus.  This justifies the use of IMRT in the patient's treatment.    -----------------------------------  Eppie Gibson, MD

## 2019-04-10 NOTE — Progress Notes (Signed)
[facility] Initial Psychosocial Assessment Clinical Social Work  Clinical Social Work contacted by phone to assess psychosocial, emotional, mental health, and spiritual needs of the patient.   Barriers to care/review of distress screen:  - Transportation:  Do you anticipate any problems getting to appointments?  Do you have someone who can help run errands for you if you need it?  Is living at daughter's house now, will go back home to [place] soon.  Has been driving to [place] to continue doing that.  "I have been told I can probably drive the first two weeks."  Hopes that she will be able to get friends and husband to help drive.  May need help w [facility] gas cards at some point.   - Help at home:  What is your living situation (alone, family, other)?  If you are physically unable to care for yourself, who would you call on to help you?  Lives with husband, she has been staying elsewhere while recovering.  Now staying with daughter in [place] due to multiple MD appointments.  May like to stay there - is able to walk outside and likes to exercise.  Used to work out at [employer] 5 days/week, taught yoga and Cardio plus classes.   - Support system:  What does your support system look like?  Who would you call on if you needed some kind of practical help?  What if you needed someone to talk to for emotional support? - Finances:  Are you concerned about finances  Considering returning to work?  If not, applying for disability?  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  Has worked from home when possible.  What is your understanding of where you are with your cancer? Its cause?  Your treatment plan and what happens next? "I am OK, you have good days and bad days."  Starts radiation treatment soon - was initially  frustrated with the time it took to be diagnosed with cancer.  Was initially told that she had acid reflux.  Now with treatment plan in place, she is more confident.  Met w dietitian yesterday, got good information, "I know its important to not lose any more weight."    What are your worries for the future as you begin treatment for cancer?  Adjusting to loss of ability to speak in normal fashion, managing complexities of maintaining nutrition and hydration despite challenges with eating.  Worried that she may be expected to take on more household duties than she is physically able to - needs to determine how to adjust family expectations of her  What are your hopes and priorities during your treatment? What is important to you? What are your goals for your care?  Hopes to have cancer cured, however also aware that she has a difficult fight ahead of her.    What are you NOT willing to sacrifice during your treatment?  Wants to remain as active as possible and continue to exercise.  "Think I am OK."     CSW Summary:  Patient and family psychosocial functioning including strengths, limitations, and coping skills: Married 68 yo female, daughter lives in [place] and is supportive.  New diagnosis of head/neck cancer.  Healing from recent surgery.  Dealing with multiple major life changes  as result of laryngectomy, tube feedings, PEG tube.  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  Plans to return to [place] with husband at some point, currently living w daughter in [place].  Will have radiation treatments, CT Sim is today.  Plans to drive herself to/from treatments.  Does have friends/husband who can help if needed.  Hopes to continue to supervise her business while in treatment.    Identifications of barriers to care:  Unsure of friend's or husbands availability to transport, hopes  to be able to drive herself most of the time.  Lives in [place]  Availability of community resources:  May need help w transportation and/or gas cards.    Clinical Social Worker follow up needed: No.   Will email [place] calendar, she has our contact information.  Please reconsult if needs arise.  Edwyna Shell, LCSW Clinical Social Worker Phone:  [phone] Cell:  [phone]

## 2019-04-11 DIAGNOSIS — R490 Dysphonia: Secondary | ICD-10-CM | POA: Diagnosis not present

## 2019-04-11 DIAGNOSIS — J383 Other diseases of vocal cords: Secondary | ICD-10-CM | POA: Diagnosis not present

## 2019-04-11 DIAGNOSIS — Z930 Tracheostomy status: Secondary | ICD-10-CM | POA: Diagnosis not present

## 2019-04-11 DIAGNOSIS — C801 Malignant (primary) neoplasm, unspecified: Secondary | ICD-10-CM | POA: Diagnosis not present

## 2019-04-11 DIAGNOSIS — J3801 Paralysis of vocal cords and larynx, unilateral: Secondary | ICD-10-CM | POA: Diagnosis not present

## 2019-04-13 DIAGNOSIS — M542 Cervicalgia: Secondary | ICD-10-CM | POA: Diagnosis not present

## 2019-04-13 DIAGNOSIS — R1310 Dysphagia, unspecified: Secondary | ICD-10-CM | POA: Diagnosis not present

## 2019-04-13 DIAGNOSIS — Z483 Aftercare following surgery for neoplasm: Secondary | ICD-10-CM | POA: Diagnosis not present

## 2019-04-13 DIAGNOSIS — Z4803 Encounter for change or removal of drains: Secondary | ICD-10-CM | POA: Diagnosis not present

## 2019-04-13 DIAGNOSIS — Z48298 Encounter for aftercare following other organ transplant: Secondary | ICD-10-CM | POA: Diagnosis not present

## 2019-04-13 DIAGNOSIS — C329 Malignant neoplasm of larynx, unspecified: Secondary | ICD-10-CM | POA: Diagnosis not present

## 2019-04-13 DIAGNOSIS — J383 Other diseases of vocal cords: Secondary | ICD-10-CM | POA: Diagnosis not present

## 2019-04-13 DIAGNOSIS — R490 Dysphonia: Secondary | ICD-10-CM | POA: Diagnosis not present

## 2019-04-13 DIAGNOSIS — Z430 Encounter for attention to tracheostomy: Secondary | ICD-10-CM | POA: Diagnosis not present

## 2019-04-13 DIAGNOSIS — Z4801 Encounter for change or removal of surgical wound dressing: Secondary | ICD-10-CM | POA: Diagnosis not present

## 2019-04-13 DIAGNOSIS — H9202 Otalgia, left ear: Secondary | ICD-10-CM | POA: Diagnosis not present

## 2019-04-14 DIAGNOSIS — M542 Cervicalgia: Secondary | ICD-10-CM | POA: Diagnosis not present

## 2019-04-14 DIAGNOSIS — Z483 Aftercare following surgery for neoplasm: Secondary | ICD-10-CM | POA: Diagnosis not present

## 2019-04-14 DIAGNOSIS — Z4803 Encounter for change or removal of drains: Secondary | ICD-10-CM | POA: Diagnosis not present

## 2019-04-14 DIAGNOSIS — R1310 Dysphagia, unspecified: Secondary | ICD-10-CM | POA: Diagnosis not present

## 2019-04-14 DIAGNOSIS — H9202 Otalgia, left ear: Secondary | ICD-10-CM | POA: Diagnosis not present

## 2019-04-14 DIAGNOSIS — R490 Dysphonia: Secondary | ICD-10-CM | POA: Diagnosis not present

## 2019-04-14 DIAGNOSIS — Z48298 Encounter for aftercare following other organ transplant: Secondary | ICD-10-CM | POA: Diagnosis not present

## 2019-04-14 DIAGNOSIS — C329 Malignant neoplasm of larynx, unspecified: Secondary | ICD-10-CM | POA: Diagnosis not present

## 2019-04-14 DIAGNOSIS — J383 Other diseases of vocal cords: Secondary | ICD-10-CM | POA: Diagnosis not present

## 2019-04-14 DIAGNOSIS — Z4801 Encounter for change or removal of surgical wound dressing: Secondary | ICD-10-CM | POA: Diagnosis not present

## 2019-04-16 DIAGNOSIS — Z510 Encounter for antineoplastic radiation therapy: Secondary | ICD-10-CM | POA: Diagnosis not present

## 2019-04-16 DIAGNOSIS — C320 Malignant neoplasm of glottis: Secondary | ICD-10-CM | POA: Diagnosis not present

## 2019-04-20 ENCOUNTER — Ambulatory Visit
Admission: RE | Admit: 2019-04-20 | Discharge: 2019-04-20 | Disposition: A | Discharge status: Home / Self Care | Payer: Medicare HMO | Source: Ambulatory Visit | Attending: Radiation Oncology | Admitting: Radiation Oncology

## 2019-04-20 ENCOUNTER — Other Ambulatory Visit: Payer: Self-pay

## 2019-04-20 DIAGNOSIS — Z510 Encounter for antineoplastic radiation therapy: Secondary | ICD-10-CM | POA: Diagnosis not present

## 2019-04-20 DIAGNOSIS — C320 Malignant neoplasm of glottis: Secondary | ICD-10-CM | POA: Diagnosis not present

## 2019-04-21 ENCOUNTER — Ambulatory Visit
Admission: RE | Admit: 2019-04-21 | Discharge: 2019-04-21 | Disposition: A | Discharge status: Home / Self Care | Payer: Medicare HMO | Source: Ambulatory Visit | Attending: Radiation Oncology | Admitting: Radiation Oncology

## 2019-04-21 DIAGNOSIS — C320 Malignant neoplasm of glottis: Secondary | ICD-10-CM

## 2019-04-21 DIAGNOSIS — Z510 Encounter for antineoplastic radiation therapy: Secondary | ICD-10-CM | POA: Diagnosis not present

## 2019-04-21 MED ORDER — SONAFINE EX EMUL
1.0000 "application " | Freq: Once | CUTANEOUS | Status: AC
  Administered 2019-04-21: 1 via TOPICAL

## 2019-04-21 NOTE — Progress Notes (Signed)

## 2019-04-22 ENCOUNTER — Ambulatory Visit
Admission: RE | Admit: 2019-04-22 | Discharge: 2019-04-22 | Disposition: A | Discharge status: Home / Self Care | Payer: Medicare HMO | Source: Ambulatory Visit | Attending: Radiation Oncology | Admitting: Radiation Oncology

## 2019-04-22 ENCOUNTER — Other Ambulatory Visit: Payer: Self-pay

## 2019-04-22 DIAGNOSIS — Z9089 Acquired absence of other organs: Secondary | ICD-10-CM | POA: Diagnosis not present

## 2019-04-22 DIAGNOSIS — R1310 Dysphagia, unspecified: Secondary | ICD-10-CM | POA: Diagnosis not present

## 2019-04-22 DIAGNOSIS — Z9889 Other specified postprocedural states: Secondary | ICD-10-CM | POA: Diagnosis not present

## 2019-04-22 DIAGNOSIS — C320 Malignant neoplasm of glottis: Secondary | ICD-10-CM | POA: Diagnosis not present

## 2019-04-22 DIAGNOSIS — C329 Malignant neoplasm of larynx, unspecified: Secondary | ICD-10-CM | POA: Diagnosis not present

## 2019-04-22 DIAGNOSIS — Z510 Encounter for antineoplastic radiation therapy: Secondary | ICD-10-CM | POA: Diagnosis not present

## 2019-04-23 ENCOUNTER — Ambulatory Visit
Admission: RE | Admit: 2019-04-23 | Discharge: 2019-04-23 | Disposition: A | Discharge status: Home / Self Care | Payer: Medicare HMO | Source: Ambulatory Visit | Attending: Radiation Oncology | Admitting: Radiation Oncology

## 2019-04-23 ENCOUNTER — Other Ambulatory Visit: Payer: Self-pay

## 2019-04-23 ENCOUNTER — Encounter: Payer: Self-pay | Admitting: *Deleted

## 2019-04-23 DIAGNOSIS — C320 Malignant neoplasm of glottis: Secondary | ICD-10-CM | POA: Diagnosis not present

## 2019-04-23 DIAGNOSIS — Z510 Encounter for antineoplastic radiation therapy: Secondary | ICD-10-CM | POA: Diagnosis not present

## 2019-04-23 DIAGNOSIS — Z430 Encounter for attention to tracheostomy: Secondary | ICD-10-CM | POA: Diagnosis not present

## 2019-04-27 ENCOUNTER — Other Ambulatory Visit: Payer: Self-pay

## 2019-04-27 ENCOUNTER — Ambulatory Visit
Admission: RE | Admit: 2019-04-27 | Discharge: 2019-04-27 | Disposition: A | Discharge status: Home / Self Care | Payer: Medicare HMO | Source: Ambulatory Visit | Attending: Radiation Oncology | Admitting: Radiation Oncology

## 2019-04-27 DIAGNOSIS — C320 Malignant neoplasm of glottis: Secondary | ICD-10-CM | POA: Insufficient documentation

## 2019-04-27 DIAGNOSIS — Z510 Encounter for antineoplastic radiation therapy: Secondary | ICD-10-CM | POA: Insufficient documentation

## 2019-04-27 NOTE — Progress Notes (Signed)
Oncology Nurse Navigator Documentation  Met with Melody Ward after RT to check on her well being since starting RT earlier this week. She stated tmts going well, denied questions/concerns. We discussed her attendance at next Melody Ward to meet with Melody Ward.  She voiced understanding she will see Melody Ward at 9:30 after which she will proceed to RT.  I explained registration/arrival procedure. I encouraged her to call me with questions/concerns prior to next Thursday, she agreed to do so.  Melody Orem, RN, BSN Head & Neck Oncology Nurse [place] [facility] [place] [phone]

## 2019-04-28 ENCOUNTER — Other Ambulatory Visit: Payer: Self-pay

## 2019-04-28 ENCOUNTER — Encounter (HOSPITAL_COMMUNITY): Payer: Self-pay | Admitting: Physical Therapy

## 2019-04-28 ENCOUNTER — Ambulatory Visit (HOSPITAL_COMMUNITY): Payer: Medicare HMO | Attending: Radiation Oncology | Admitting: Physical Therapy

## 2019-04-28 ENCOUNTER — Ambulatory Visit
Admission: RE | Admit: 2019-04-28 | Discharge: 2019-04-28 | Disposition: A | Discharge status: Home / Self Care | Payer: Medicare HMO | Source: Ambulatory Visit | Attending: Radiation Oncology | Admitting: Radiation Oncology

## 2019-04-28 DIAGNOSIS — I890 Lymphedema, not elsewhere classified: Secondary | ICD-10-CM | POA: Insufficient documentation

## 2019-04-28 DIAGNOSIS — Z510 Encounter for antineoplastic radiation therapy: Secondary | ICD-10-CM | POA: Diagnosis not present

## 2019-04-28 DIAGNOSIS — M436 Torticollis: Secondary | ICD-10-CM | POA: Insufficient documentation

## 2019-04-28 DIAGNOSIS — R1313 Dysphagia, pharyngeal phase: Secondary | ICD-10-CM | POA: Insufficient documentation

## 2019-04-28 DIAGNOSIS — C320 Malignant neoplasm of glottis: Secondary | ICD-10-CM | POA: Diagnosis not present

## 2019-04-28 NOTE — Therapy (Signed)
[place], [place], [address] Phone: [phone]   Fax:  [phone]  Physical Therapy Evaluation  Patient Details  Name: Melody Ward MRN: [id number] Date of Birth: May 11, 1950 Referring Provider (PT): Eppie Gibson    Encounter Date: 04/28/2019  PT End of Session - 04/28/19 1637    Visit Number  1    Number of Visits  12    Date for PT Re-Evaluation  05/26/19    Authorization Type  [employer] medicare    Authorization - Visit Number  1    Authorization - Number of Visits  10    PT Start Time  0845    PT Stop Time  0920    PT Time Calculation (min)  35 min    Activity Tolerance  Patient tolerated treatment well    Behavior During Therapy  [facility] for tasks assessed/performed       Past Medical History:  Diagnosis Date  . melanoma 1990    Past Surgical History:  Procedure Laterality Date  . CESAREAN SECTION    . FREE FLAP RADIAL FOREARM  03/06/2019   Dr. Nicolette Bang [facility]  . MODIFIED RADICAL NECK DISSECTION  03/06/2019  . NECK DISSECTION  03/06/2019   bilateral neck dissection  . NERVE GRAFT Left 03/06/2019   left arm lower, Dr. Nicolette Bang at [facility]  . PHARYNGECTOMY  03/06/2019   Dr. Nicolette Bang at [facility]  . TOTAL LARYNGECTOMY  03/06/2019   Dr. Nicolette Bang at [facility]  . 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 Nicolette Bang [facility]  . WRIST SURGERY  06/06/2018   ORIF distal radius fracture    There were no vitals filed for this visit.   Subjective Assessment - 04/28/19 0842    Subjective  Pt has been dx with laryngeal cancer.  She has a total laryngectomy on November 13th and has had swelling ever since.  She had a fistula which became infected and she had to go back into the hospital due to infection.  She currently has two bandaids on her neck due to having to have two tubes placed in her neck, she states that they are almost closed.  She is currently going thru radiation treatment.    Currently in Pain?  No/denies         [facility] PT Assessment - 04/28/19 0001      Assessment   Medical Diagnosis  cervical lymphedema     Referring Provider (PT)  Eppie Gibson     Onset Date/Surgical Date  03/06/19    Prior Therapy  none      Balance Screen   Has the patient fallen in the past 6 months  No    Has the patient had a decrease in activity level because of a fear of falling?   No    Is the patient reluctant to leave their home because of a fear of falling?   No      Prior Function   Level of Independence  Independent      Cognition   Overall Cognitive Status  Within Functional Limits for tasks assessed      ROM / Strength   AROM / PROM / Strength  AROM      AROM   AROM Assessment Site  Cervical    Cervical Flexion  35    Cervical Extension  40    Cervical - Right Side Bend  20    Cervical - Left Side  Bend  20    Cervical - Right Rotation  48    Cervical - Left Rotation  45      Palpation   Palpation comment  noted increased fibrosis, decreased scar movement         LYMPHEDEMA/ONCOLOGY QUESTIONNAIRE - 04/28/19 0910      Surgeries   Other Surgery Date  03/05/20    Number Lymph Nodes Removed  47      Date Lymphedema/Swelling Started   Date  03/07/19      Treatment   Active Radiation Treatment  Yes      What other symptoms do you have   Are you Having Heaviness or Tightness  Yes    Are you having Pain  No    Are you having pitting edema  No    Is it Hard or Difficult finding clothes that fit  No    Do you have infections  --   past not now    Is there Decreased scar mobility  Yes      Lymphedema Assessments   Lymphedema Assessments  Head and Neck             Objective measurements completed on examination: See above findings.      [facility] Adult PT Treatment/Exercise - 04/28/19 0001      Manual Therapy   Manual Therapy  Manual Lymphatic Drainage (MLD);Compression Bandaging    Manual therapy comments  completed seperate from all other  aspects of treatment     Manual Lymphatic Drainage (MLD)  instructed pt in self manual techniques 1-8.    Compression Bandaging  cut foam explained that pt should use compression for four hours a day.                PT Short Term Goals - 04/28/19 1643      PT SHORT TERM GOAL #1   Title  Pt to be I in wearing her compression four hours a day to decrease fibrosis of skin.    Time  2    Period  Weeks    Status  New    Target Date  05/12/19        PT Long Term Goals - 04/28/19 1645      PT LONG TERM GOAL #1   Title  PT to be I in self manual to decrease edema in cervical area.    Time  4    Period  Weeks    Status  New    Target Date  05/26/19      PT LONG TERM GOAL #2   Title  PT cervical rotation to improve 10 degrees both directions for safer driving    Time  4    Period  Weeks    Status  New      PT LONG TERM GOAL #3   Title  PT measurement to have decreased by 1 cm to demonstrate decreased cervical edema.    Time  4    Period  Weeks    Status  New             Plan - 04/28/19 1639    Clinical Impression Statement  PT arrived late to therapy.  Therapist instructed what lymphedema is, what it is caused by, and how it is controlled.  Chip bag made for pt throat and pt was instructed in self manual techniques 1-8 .  PT has noted increased edema, decreased cervical ROM and fibrotic changes to skin.  Pt will benefit from skilled PT to address these issues decrease her volume to aid in swallowing, improve her ROM to aid in safer driving and improve her skin integrity to decrease the risk of cellulitis.    Examination-Activity Limitations  Other    Examination-Participation Restrictions  Other;Driving    Stability/Clinical Decision Making  Stable/Uncomplicated    Clinical Decision Making  Low    Rehab Potential  Good    PT Frequency  3x / week    PT Duration  4 weeks    PT Treatment/Interventions  Manual techniques;Manual lymph drainage;Patient/family education     PT Next Visit Plan  measure for volume, begin manual decongestive chest, neck and head.    PT Home Exercise Plan  manual and using compression       Patient will benefit from skilled therapeutic intervention in order to improve the following deficits and impairments:  Decreased skin integrity, Decreased range of motion, Increased edema  Visit Diagnosis: Lymphedema, not elsewhere classified - Plan: PT plan of care cert/re-cert  Stiffness of cervical spine - Plan: PT plan of care cert/re-cert     Problem List Patient Active Problem List   Diagnosis Date Noted  . 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 Osteopenia 04/17/2016  . Sun-damaged skin 03/31/2016    [name], PT CLT [phone] 04/28/2019, 4:50 PM  [place] [address] [place], [place], [address] Phone: [phone]   Fax:  [phone]  Name: Melody Ward MRN: [id number] Date of Birth: Oct 05, 1950

## 2019-04-29 ENCOUNTER — Ambulatory Visit
Admission: RE | Admit: 2019-04-29 | Discharge: 2019-04-29 | Disposition: A | Discharge status: Home / Self Care | Payer: Medicare HMO | Source: Ambulatory Visit | Attending: Radiation Oncology | Admitting: Radiation Oncology

## 2019-04-29 ENCOUNTER — Other Ambulatory Visit: Payer: Self-pay

## 2019-04-29 DIAGNOSIS — C320 Malignant neoplasm of glottis: Secondary | ICD-10-CM | POA: Diagnosis not present

## 2019-04-29 DIAGNOSIS — Z510 Encounter for antineoplastic radiation therapy: Secondary | ICD-10-CM | POA: Diagnosis not present

## 2019-04-30 ENCOUNTER — Ambulatory Visit: Payer: Medicare HMO | Attending: Radiation Oncology

## 2019-04-30 ENCOUNTER — Ambulatory Visit
Admission: RE | Admit: 2019-04-30 | Discharge: 2019-04-30 | Disposition: A | Discharge status: Home / Self Care | Payer: Medicare HMO | Source: Ambulatory Visit | Attending: Radiation Oncology | Admitting: Radiation Oncology

## 2019-04-30 ENCOUNTER — Other Ambulatory Visit: Payer: Self-pay

## 2019-04-30 DIAGNOSIS — R1310 Dysphagia, unspecified: Secondary | ICD-10-CM | POA: Diagnosis not present

## 2019-04-30 DIAGNOSIS — C320 Malignant neoplasm of glottis: Secondary | ICD-10-CM | POA: Diagnosis not present

## 2019-04-30 DIAGNOSIS — R491 Aphonia: Secondary | ICD-10-CM | POA: Diagnosis not present

## 2019-04-30 DIAGNOSIS — Z510 Encounter for antineoplastic radiation therapy: Secondary | ICD-10-CM | POA: Diagnosis not present

## 2019-04-30 DIAGNOSIS — R1319 Other dysphagia: Secondary | ICD-10-CM

## 2019-04-30 NOTE — Therapy (Signed)
[place] [address] [place], [place], [address] Phone: [phone]   Fax:  [phone]  Speech Language Pathology Evaluation  Patient Details  Name: Melody Ward MRN: [id number] Date of Birth: 1950/06/12 Referring Provider (SLP): Eppie Gibson, MD   Encounter Date: 04/30/2019  End of Session - 04/30/19 1633    Visit Number  1    Number of Visits  13    Date for SLP Re-Evaluation  07/29/19   (90 days)   SLP Start Time  0952    SLP Stop Time   1032    SLP Time Calculation (min)  40 min    Activity Tolerance  Patient tolerated treatment well       Past Medical History:  Diagnosis Date  . melanoma 1990    Past Surgical History:  Procedure Laterality Date  . CESAREAN SECTION    . FREE FLAP RADIAL FOREARM  03/06/2019   Dr. Nicolette Bang [facility]  . MODIFIED RADICAL NECK DISSECTION  03/06/2019  . NECK DISSECTION  03/06/2019   bilateral neck dissection  . NERVE GRAFT Left 03/06/2019   left arm lower, Dr. Nico[facility] Bang at [facility]  . PHARYNGECTOMY  03/06/2019   Dr[facility] Bang at [facility]  . TOTAL LARYNGECTOMY  03/06/2019   Dr. Nicolette Bang at [facility]  . 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 Nicolette Bang [facility]  . WRIST SURGERY  06/06/2018   ORIF distal radius fracture    There were no vitals filed for this visit.  Subjective Assessment - 04/30/19 1013    Subjective  Pt reports being told by [facility] SLP/s to eat "soft foods". At this time, SLP cannot find any record of objective swallow assessment in Epic, in order to confirm.    Currently in Pain?  No/denies         SLP Evaluation [facility] - 04/30/19 [id number]      SLP Visit Information   SLP Received On  04/30/19    Referring Provider (SLP)  Eppie Gibson, MD    Onset Date  Jan 2019    Medical Diagnosis  Glottic CA S/P laryngectomy      Subjective   Patient/Family Stated Goal  Exhibit normal swallowing post radiation      General  Information   HPI  developed hoarseness Jan '19, but not problematic until after URI in 06/2018. 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 Dr. 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 Large mass found on neck CT with nodal involvement on lt. [name] recommeded surgery - Dr. Nicolette Bang performed total laryngectomy on 03-03-19 with partial lt pharyngetomy, and total neck dissection on 03-06-19. Pt fitted with TEP and HME - intelligibility is 100% although pt exhibits consistent hydrophonia.       Prior Functional Status   Cognitive/Linguistic Baseline  Within functional limits      Cognition   Overall Cognitive Status  Within Functional Limits for tasks assessed      Auditory Comprehension   Overall Auditory Comprehension  Appears within functional limits for tasks assessed      Verbal Expression   Overall Verbal Expression  Appears within functional limits for tasks assessed      Oral Motor/Sensory Function   Overall Oral Motor/Sensory Function  Appears within functional limits for tasks assessed      Motor Speech   Overall Motor Speech  Appears within functional limits for tasks assessed    Phonation  Other (comment)   TEP used with HME   Phonation  --   pt is hydrophonic & does not clear with swallow      Pt currently tolerates soft food with thin liquids. Oral motor assessment deferred due to COVID-19 precautions. POs: Pt ate sliced [place] and drank water with need for liquid wash. Swallows appeared timely. Pt's swallow deemed WFL at this time.   Because data states the risk for dysphagia during and after radiation treatment is high due to undergoing radiation tx, SLP taught pt about the possibility of reduced/limited ability for PO intake during rad tx. SLP encouraged pt to continue swallowing POs as far into rad tx as possible, even ingesting POs and/or completing HEP shortly  after administration of pain meds.   SLP educated pt re: changes to swallowing musculature after rad tx, and why adherence to dysphagia HEP provided today and PO consumption was necessary to inhibit muscular disuse atrophy and to reduce muscle fibrosis following rad tx. Pt demonstrated understanding of these things to SLP.    SLP then developed a HEP for pt and pt was instructed how to perform exercises involving lingual, vocal, and pharyngeal strengthening. SLP performed each exercise and pt return demonstrated each exercise. SLP ensured pt performance was correct prior to moving on to next exercise. Pt was instructed to complete this program 2 times a day, until 6 months after her last rad tx, then x2 a week after that.                 SLP Education - 04/30/19 1632    Education Details  Swallow HEP procedure, late effects head/neck radiation    Person(s) Educated  Patient    Methods  Explanation;Demonstration;Verbal cues;Handout    Comprehension  Verbalized understanding;Returned demonstration;Verbal cues required;Need further instruction       SLP Short Term Goals - 04/30/19 1638      SLP SHORT TERM GOAL #1   Title  Pt will complete HEP with rare min A over two sessions    Time  4    Period  --   sessions, for all STGs   Status  New      SLP SHORT TERM GOAL #2   Title  Pt will tell SLP rationale for HEP completion over two sessions    Time  4    Status  New      SLP SHORT TERM GOAL #3   Title  Pt will tell SLP how a food journal can hasten/facilitate return to more normalized diet    Time  4    Status  New       SLP Long Term Goals - 04/30/19 1640      SLP LONG TERM GOAL #1   Title  Pt will complete HEP with modified independence over 3 visits    Time  5    Period  --   visits, for all LTGs   Status  New      SLP LONG TERM GOAL #2   Title  Pt will tell SLP when to decr frequency of HEP    Time  8    Status  New       Plan - 04/30/19 1634     Clinical Impression Statement  Pt presents today with [facility] swallowing ability pre-rad tx, and post laryngectomy. Pt placed with TEP at [facility] and is using successfully (100 intelligibliity), however  pt with consistent hydrophonic voice which does not fully clear with swallowing. SLP ?s neopharyngeal residue above level of TEP vs. TEP length (?), vs. some other factor inhibiting appropriate TEP vibration. Regarding pt swallow function, data suggests that as pts progress through rad or chemorad therapy that their swallowing ability will decrease. Also, WNL swallowing is threatened by muscle fibrosis that will likely develop after rad/chemorad is completed. Therefore, pt was developed an indivdualized exercise program to mitigate/eliminate muscle fibrosis following rad tx. Skilled ST would be beneficial to the pt in order to regularly assess pt's performance with POs, and/or assess the need for instrumental swallow assessment, as well as to assess for proper completion of HEP.    Speech Therapy Frequency  1x /week    Duration  --   x12 weeks, (or 90 days)   Treatment/Interventions  Aspiration precaution training;Pharyngeal strengthening exercises;Diet toleration management by SLP;Trials of upgraded texture/liquids;Internal/external aids;Patient/family education;Compensatory strategies;SLP instruction and feedback    Potential to Achieve Goals  Good       Patient will benefit from skilled therapeutic intervention in order to improve the following deficits and impairments:   Esophageal dysphagia  Aphonia    Problem List Patient Active Problem List   Diagnosis Date Noted  . 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 Osteopenia 04/17/2016  . Sun-damaged skin 03/31/2016    Emanuelle Hammerstrom ,MS, CCC-SLP  04/30/2019, 4:43 PM  [place] [address] [place], [place], [address] Phone: [phone]   Fax:  [phone]  Name: Melody Ward MRN: [id number] Date of Birth: 14-Mar-1951

## 2019-04-30 NOTE — Patient Instructions (Signed)
Swallow HEP provided to pt today via paper handout

## 2019-05-01 ENCOUNTER — Ambulatory Visit (HOSPITAL_COMMUNITY): Payer: Medicare HMO | Admitting: Physical Therapy

## 2019-05-01 ENCOUNTER — Ambulatory Visit
Admission: RE | Admit: 2019-05-01 | Discharge: 2019-05-01 | Disposition: A | Discharge status: Home / Self Care | Payer: Medicare HMO | Source: Ambulatory Visit | Attending: Radiation Oncology | Admitting: Radiation Oncology

## 2019-05-01 ENCOUNTER — Other Ambulatory Visit: Payer: Self-pay

## 2019-05-01 ENCOUNTER — Encounter (HOSPITAL_COMMUNITY): Payer: Self-pay | Admitting: Physical Therapy

## 2019-05-01 DIAGNOSIS — C320 Malignant neoplasm of glottis: Secondary | ICD-10-CM | POA: Diagnosis not present

## 2019-05-01 DIAGNOSIS — R1313 Dysphagia, pharyngeal phase: Secondary | ICD-10-CM | POA: Diagnosis not present

## 2019-05-01 DIAGNOSIS — M436 Torticollis: Secondary | ICD-10-CM | POA: Diagnosis not present

## 2019-05-01 DIAGNOSIS — I890 Lymphedema, not elsewhere classified: Secondary | ICD-10-CM | POA: Diagnosis not present

## 2019-05-01 DIAGNOSIS — Z510 Encounter for antineoplastic radiation therapy: Secondary | ICD-10-CM | POA: Diagnosis not present

## 2019-05-01 NOTE — Therapy (Signed)
[place], [place], [address] Phone: [phone]   Fax:  [phone]  Physical Therapy Treatment  Patient Details  Name: KALINA KAMMER MRN: [id number] Date of Birth: Oct 09, 1950 Referring Provider (PT): Eppie Gibson    Encounter Date: 05/01/2019  PT End of Session - 05/01/19 0923    Visit Number  2    Number of Visits  12    Date for PT Re-Evaluation  05/26/19    Authorization Type  [employer]na medicare    Authorization - Visit Number  2    Authorization - Number of Visits  10    PT Start Time  [phone]    PT Stop Time  0917    PT Time Calculation (min)  40 min    Activity Tolerance  Patient tolerated treatment well    Behavior During Therapy  [facility] for tasks assessed/performed       Past Medical History:  Diagnosis Date  . melanoma 1990    Past Surgical History:  Procedure Laterality Date  . CESAREAN SECTION    . FREE FLAP RADIAL FOREARM  03/06/2019   Dr. Nicolette Bang [facility]  . MODIFIED RADICAL NECK DISSECTION  03/06/2019  . NECK DISSECTION  03/06/2019   bilateral neck dissection  . NERVE GRAFT Left 03/06/2019   left arm lower, Dr. Nicolette Bang at [facility]  . PHARYNGECTOMY  03/06/2019   Dr. Nicolette Bang at [facility]  . TOTAL LARYNGECTOMY  03/06/2019   Dr. Nicolette Bang at [facility]  . 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 Nicolette Bang [facility]  . WRIST SURGERY  06/06/2018   ORIF distal radius fracture    There were no vitals filed for this visit.  Subjective Assessment - 05/01/19 0836    Subjective  PT states that she really hasn't had a chance to try the self manual techniques.    Currently in Pain?  No/denies            LYMPHEDEMA/ONCOLOGY QUESTIONNAIRE - 05/01/19 0841      Surgeries   Other Surgery Date  03/05/20    Number Lymph Nodes Removed  47      Date Lymphedema/Swelling Started   Date  03/07/19      Treatment   Active Radiation Treatment  Yes      What other symptoms do you  have   Are you Having Heaviness or Tightness  Yes    Are you having Pain  No    Are you having pitting edema  No    Is it Hard or Difficult finding clothes that fit  No    Do you have infections  --   past not now    Is there Decreased scar mobility  Yes      Lymphedema Assessments   Lymphedema Assessments  Head and Neck      Head and Neck   Right Lateral Nostril at base of nose to medial tragus   9.2 cm    Left Lateral Nostril at base of nose to medial tragus   9.1 cm    Right Corner of mouth to where ear lobe meets face  8.6 cm    Left Corner of mouth to where ear lobe meets face  8.3 cm    4 cm superior to sternal notch around neck  --   mid  30.8   6 cm superior to sternal notch around neck  --   6cm 32.8  [facility] Adult PT Treatment/Exercise - 05/01/19 0001      Exercises   Exercises  Neck      Neck Exercises: Supine   Cervical Rotation  Both;5 reps    Lateral Flexion  Both;5 reps      Manual Therapy   Manual Therapy  Manual Lymphatic Drainage (MLD);Compression Bandaging    Manual therapy comments  completed seperate from all other aspects of treatment     Manual Lymphatic Drainage (MLD)  instructed pt in self manual techniques 9 ; reveiwed 1-8. Then therapist completed manual techniques to chest, neck and head to decrease edema and fibrosis as well as to imporve ROM    Compression Bandaging  --               PT Short Term Goals - 05/01/19 0926      PT SHORT TERM GOAL #1   Title  Pt to be I in wearing her compression four hours a day to decrease fibrosis of skin.    Time  2    Period  Weeks    Status  On-going    Target Date  05/12/19        PT Long Term Goals - 05/01/19 0926      PT LONG TERM GOAL #1   Title  PT to be I in self manual to decrease edema in cervical area.    Time  4    Period  Weeks    Status  On-going      PT LONG TERM GOAL #2   Title  PT cervical rotation to improve 10 degrees both directions for safer  driving    Time  4    Period  Weeks    Status  On-going      PT LONG TERM GOAL #3   Title  PT measurement to have decreased by 1 cm to demonstrate decreased cervical edema.    Time  4    Period  Weeks    Status  On-going            Plan - 05/01/19 [id number]    Clinical Impression Statement  Patient with questions on how to wear compression which were answered by therapist.  Instructed pt on manual to cervical area and reviewed chest.  Therapist measured pt, cervical area needed to be modifed due to recent stoma.    Examination-Activity Limitations  Other    Examination-Participation Restrictions  Other;Driving    Stability/Clinical Decision Making  Stable/Uncomplicated    Rehab Potential  Good    PT Frequency  3x / week    PT Duration  4 weeks    PT Treatment/Interventions  Manual techniques;Manual lymph drainage;Patient/family education    PT Next Visit Plan  Answer any questions, make sure pt is completing ROM exercises.    PT Home Exercise Plan  manual and using compression       Patient will benefit from skilled therapeutic intervention in order to improve the following deficits and impairments:  Decreased skin integrity, Decreased range of motion, Increased edema  Visit Diagnosis: Lymphedema, not elsewhere classified  Stiffness of cervical spine     Problem List Patient Active Problem List   Diagnosis Date Noted  . 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 Osteopenia 04/17/2016  . Sun-damaged skin 03/31/2016   [name], PT CLT [phone] 05/01/2019, 9:27 AM  [place] [address] [place], [place], [address] Phone: [phone]   Fax:  [phone]  Name: SUZON BROOKER MRN: [id number] Date of Birth: 19-Sep-1950

## 2019-05-04 ENCOUNTER — Telehealth (HOSPITAL_COMMUNITY): Payer: Self-pay | Admitting: Physical Therapy

## 2019-05-04 ENCOUNTER — Inpatient Hospital Stay: Payer: Medicare HMO | Attending: Radiation Oncology | Admitting: Nutrition

## 2019-05-04 ENCOUNTER — Other Ambulatory Visit: Payer: Self-pay

## 2019-05-04 ENCOUNTER — Ambulatory Visit
Admission: RE | Admit: 2019-05-04 | Discharge: 2019-05-04 | Disposition: A | Discharge status: Home / Self Care | Payer: Medicare HMO | Source: Ambulatory Visit | Attending: Radiation Oncology | Admitting: Radiation Oncology

## 2019-05-04 ENCOUNTER — Other Ambulatory Visit: Payer: Self-pay | Admitting: Radiation Oncology

## 2019-05-04 DIAGNOSIS — Z510 Encounter for antineoplastic radiation therapy: Secondary | ICD-10-CM | POA: Diagnosis not present

## 2019-05-04 DIAGNOSIS — C320 Malignant neoplasm of glottis: Secondary | ICD-10-CM | POA: Diagnosis not present

## 2019-05-04 MED ORDER — LIDOCAINE VISCOUS HCL 2 % MT SOLN: OROMUCOSAL | 3 refills | Status: DC

## 2019-05-04 NOTE — Telephone Encounter (Signed)
L/m to cx tomorrow 1/12 and remined pt of ST on 05/06/2019.

## 2019-05-04 NOTE — Progress Notes (Signed)
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 Patient reports weight has decreased 2 pounds to 112 pounds from 114.2 pounds on January 4. She feels she is eating well at night but admits she may not be eating well throughout the day. She has some issues with taste alterations and constipation. She has been drinking some Ensure Enlive.  Nutrition diagnosis: Unintended weight loss continues.  Intervention: Educated patient to increase Ensure Enlive twice daily between meals.  I provided additional samples and coupons along with some recipes. Encourage soft foods throughout the day. Brief education provided on taste alterations and constipation and provided fact sheets. Patient will ask MD about taking MiraLAX. Questions were answered.  Teach back method used.  Contact information given.  Monitoring, evaluation, goals: Patient will tolerate increased calories and protein to minimize further weight loss.  Next visit: Tuesday, January 19 after radiation therapy.  **Disclaimer: This note was dictated with voice recognition software. Similar sounding words can inadvertently be transcribed and this note may contain transcription errors which may not have been corrected upon publication of note.**

## 2019-05-05 ENCOUNTER — Ambulatory Visit
Admission: RE | Admit: 2019-05-05 | Discharge: 2019-05-05 | Disposition: A | Discharge status: Home / Self Care | Payer: Medicare HMO | Source: Ambulatory Visit | Attending: Radiation Oncology | Admitting: Radiation Oncology

## 2019-05-05 ENCOUNTER — Other Ambulatory Visit: Payer: Self-pay

## 2019-05-05 ENCOUNTER — Ambulatory Visit (HOSPITAL_COMMUNITY): Payer: Medicare HMO | Admitting: Physical Therapy

## 2019-05-05 DIAGNOSIS — Z510 Encounter for antineoplastic radiation therapy: Secondary | ICD-10-CM | POA: Diagnosis not present

## 2019-05-05 DIAGNOSIS — C320 Malignant neoplasm of glottis: Secondary | ICD-10-CM | POA: Diagnosis not present

## 2019-05-05 NOTE — Telephone Encounter (Signed)
A user error has taken place: encounter opened in error, closed for administrative reasons.

## 2019-05-06 ENCOUNTER — Other Ambulatory Visit: Payer: Self-pay

## 2019-05-06 ENCOUNTER — Encounter (HOSPITAL_COMMUNITY): Payer: Self-pay | Admitting: Speech Pathology

## 2019-05-06 ENCOUNTER — Ambulatory Visit
Admission: RE | Admit: 2019-05-06 | Discharge: 2019-05-06 | Disposition: A | Discharge status: Home / Self Care | Payer: Medicare HMO | Source: Ambulatory Visit | Attending: Radiation Oncology | Admitting: Radiation Oncology

## 2019-05-06 ENCOUNTER — Ambulatory Visit (HOSPITAL_COMMUNITY): Payer: Medicare HMO | Admitting: Speech Pathology

## 2019-05-06 ENCOUNTER — Telehealth (HOSPITAL_COMMUNITY): Payer: Self-pay | Admitting: Physical Therapy

## 2019-05-06 DIAGNOSIS — I890 Lymphedema, not elsewhere classified: Secondary | ICD-10-CM | POA: Diagnosis not present

## 2019-05-06 DIAGNOSIS — Z510 Encounter for antineoplastic radiation therapy: Secondary | ICD-10-CM | POA: Diagnosis not present

## 2019-05-06 DIAGNOSIS — M436 Torticollis: Secondary | ICD-10-CM | POA: Diagnosis not present

## 2019-05-06 DIAGNOSIS — C320 Malignant neoplasm of glottis: Secondary | ICD-10-CM | POA: Diagnosis not present

## 2019-05-06 DIAGNOSIS — R1313 Dysphagia, pharyngeal phase: Secondary | ICD-10-CM | POA: Diagnosis not present

## 2019-05-06 NOTE — Telephone Encounter (Signed)
S/w patient- Pt understands not to come to this apptment - CR will be out of the ofice

## 2019-05-06 NOTE — Therapy (Signed)
[place], [place], [address] Phone: [phone]   Fax:  [phone]  Speech Language Pathology Evaluation  Patient Details  Name: Melody Ward MRN: [id number] Date of Birth: [birth year] Referring Provider (SLP): Eppie Gibson, MD   Encounter Date: 05/06/2019  End of Session - 05/06/19 1244    Visit Number  2    Number of Visits  13    Date for SLP Re-Evaluation  07/29/19   (90 days)   Authorization Type  [employer]na Medicare    SLP Start Time  [phone]    SLP Stop Time   1000    SLP Time Calculation (min)  53 min    Activity Tolerance  Patient tolerated treatment well       Past Medical History:  Diagnosis Date  . melanoma 1990    Past Surgical History:  Procedure Laterality Date  . CESAREAN SECTION    . FREE FLAP RADIAL FOREARM  03/06/2019   Dr. Nicolette Bang [facility]  . MODIFIED RADICAL NECK DISSECTION  03/06/2019  . NECK DISSECTION  03/06/2019   bilateral neck dissection  . NERVE GRAFT Left 03/06/2019   left arm lower, Dr. Nicolette Bang at [facility]  . PHARYNGECTOMY  03/06/2019   Dr. Nicolette Bang at [facility]  . TOTAL LARYNGECTOMY  03/06/2019   Dr. [facility] at [facility]  . 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 Nicolette Bang [facility]  . WRIST SURGERY  06/06/2018   ORIF distal radius fracture    There were no vitals filed for this visit.  Subjective Assessment - 05/06/19 1231    Subjective  "I thought I wasn't supposed to have this therapy until after radiation."    Currently in Pain?  No/denies        ADULT SLP TREATMENT - 05/06/19 1234      General Information   Behavior/Cognition  Alert;Cooperative;Pleasant mood    Patient Positioning  Upright in chair    Oral care provided  N/A    HPI  Melody Ward is a 69 yo female who developed hoarseness Jan '19, but not problematic until after URI in 06/2018. 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 Dr. 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 Large mass found on neck CT with nodal involvement on lt. [name] recommeded surgery - Dr. Nicolette Bang performed total laryngectomy on 03-03-19 with partial lt pharyngetomy, and total neck dissection on 03-06-19. Pt fitted with TEP and HME - intelligibility is 100% although pt exhibits consistent hydrophonia.      Treatment Provided   Treatment provided  Dysphagia      Dysphagia Treatment   Temperature Spikes Noted  No    Respiratory Status  Room air    Oral Cavity - Dentition  Adequate natural dentition    Treatment Methods  Therapeutic exercise;Compensation strategy training    Patient observed directly with PO's  No    Feeding  Able to feed self      Pain Assessment   Pain Assessment  No/denies pain      Assessment / Recommendations / Plan   Plan  Continue with current plan of care   will resume therapy after last radiation 06/01/19     Dysphagia Recommendations   Diet recommendations  Dysphagia 3 (mechanical soft);Thin liquid    Liquids provided via  Cup  SLP Education - 05/06/19 1242    Education Details  Reviewed all exercises and Pt return demonstrated, added food intake log, and plan for swallow therapy after radiation    Person(s) Educated  Patient    Methods  Explanation;Handout    Comprehension  Verbalized understanding;Returned demonstration       SLP Short Term Goals - 05/06/19 1248      SLP SHORT TERM GOAL #1   Title  Pt will complete HEP with rare min A over two sessions    Time  4    Period  --   sessions, for all STGs   Status  On-going      SLP SHORT TERM GOAL #2   Title  Pt will tell SLP rationale for HEP completion over two sessions    Time  4    Status  On-going      SLP SHORT TERM GOAL #3   Title  Pt will tell SLP how a food journal can hasten/facilitate return to more normalized diet    Time  4    Status  On-going       SLP Long Term  Goals - 05/06/19 1249      SLP LONG TERM GOAL #1   Title  Pt will complete HEP with modified independence over 3 visits    Time  5    Period  --   visits, for all LTGs   Status  On-going      SLP LONG TERM GOAL #2   Title  Pt will tell SLP when to decr frequency of HEP    Time  8    Status  On-going      Results from Esophagram 03/16/20 at [facility]: <<Scout: Postsurgical changes of total laryngectomy, partial pharyngectomy, bilateral neck dissections, and tracheoesophageal puncture, with left-sided radial forearm free flap reconstruction. Voice prosthesis projects over midline superior to the thoracic inlet. A metallic tip nasoenteric feeding tube traverses the cervicothoracic esophagus and terminates within the gastric body. Multilevel degenerative changes of the cervical spine, with bulky anterior osteophytes at the C5-C6 and C6-C7 levels. Thoracic aortic calcifications. No acute cardiopulmonary abnormality. Marland Kitchen Neopharynx: A small outpouching of contrast material is demonstrated on multiple projections arising from the left anterolateral aspect of the expected site of surgical anastomosis between the neopharynx and the cervical esophagus, approximately at the level of the C5-C6 intervertebral disc space. There is retained contrast material within this focal outpouching after clearance of intraluminal contrast, which is suggestive of a contained anastomotic leak in this location. A neopharyngeal pseudodiverticulum is an additional, though less likely, consideration given lateral orientation of the contrast-opacified outpouching. . Esophagus: Evaluation of the more distal esophagus is limited due to small contrast bolus ingested volumes, but no radiographic evidence of obstructive process or luminal irregularity along the course of the thoracic esophagus is appreciated.>>  Notes from last SLP session with [name] at [facility]: <<Dysphagia: Patient reports that she is eating a soft diet with mild  difficulty. She has to wash foods down and she sometimes feels like liquids come back up. However, her weight is stable. We briefly discussed swallowing during radiation treatment and the importance of managing side effects to facilitate PO intake. Also reviewed potential need for diet downgrade and/or use of liquid supplements to maintain nutrition. Patient also presents with tongue depressor stack from dentistry. Aperture today is 37 mm, likely within normal limits given her small stature. Provided education on 08-25-28 program. Patient also had a written copy  of this from dentist, but had been confused.   Voicing: Patient is voicing fluently with larytube (no kapigel). Voice quality is good. There is some intermittent air leakage around the tube. Patient was fitted with a flexiderm round, 8/36 blue ring tube, and flexivoice. Initially trialed the light, but patient was consistently activating the membrane with normal respiration while walking around the clinic. She was then fitted with a medium which completely resolved this issue. Patient did not note any increased effort for speaking with this option. Provided extensive education on freehands including transition between breathing/speaking, re-seating the membrane should it dislodge, and donning/doffing the device. We also reviewed freehands HME removal and attachment, and patient demonstrated independent ability to do so. Provided education on baseplate use and application. Patient was given written instructions along with samples of flexiderm round, xtrabase, skin barrier, and remover. She was also provided with a new 2020 [employer] catalogue with all items marked. She will likely need to defer using the baseplates and fleixvoice pending completion of radiation and full healing of neck and stoma. However, she may certainly order the products in advance so she is prepared to continue working on these skills at next appointment.>>    Plan - 05/06/19 1248     Clinical Impression Statement Pt seen this date after transfer from [facility], [place] in [place]. Radiation is planned from December 28 through June 01, 2019, 5 days per week. She was given swallowing exercises with Glendell Docker last week and brought to today's session. These were reviewed and return demonstrated with Pt this date. Pt was able to complete each exercise with use of written cue. She was asked to continue completion 3x per day through radiation treatment and return for a visit after completion of radiation. She has been eating soft foods and liquids and will be meeting with RD. SLP provided additional soft food recommendations and a food journal to complete while going through treatment (and to share with RD). Pt acknowledged understanding and feels confident in completing exercises on her own and will contact SLP if she has questions before the next visit. Please see information above regarding esophagram and last SLP note at [facility]. Will plan to see Pt the week of February 8 or sooner if she desires.    Speech Therapy Frequency  1x /week    Duration  --   x12 weeks, or 90 days   Treatment/Interventions  Aspiration precaution training;Pharyngeal strengthening exercises;Diet toleration management by SLP;Trials of upgraded texture/liquids;Internal/external aids;Patient/family education;Compensatory strategies;SLP instruction and feedback    Potential to Achieve Goals  Good    Consulted and Agree with Plan of Care  Patient       Patient will benefit from skilled therapeutic intervention in order to improve the following deficits and impairments:   Dysphagia, pharyngeal phase    Problem List Patient Active Problem List   Diagnosis Date Noted  . 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 Osteopenia 04/17/2016  . Sun-damaged skin 03/31/2016   Thank you,  [name], [place]  [facility] 05/06/2019, 12:50 PM  [place] [address] [place], [place], [address] Phone: [phone]   Fax:  [phone]  Name: Melody Ward MRN: [id number] Date of Birth: [birth year]

## 2019-05-07 ENCOUNTER — Ambulatory Visit (HOSPITAL_COMMUNITY): Payer: Medicare HMO | Admitting: Physical Therapy

## 2019-05-07 ENCOUNTER — Ambulatory Visit
Admission: RE | Admit: 2019-05-07 | Discharge: 2019-05-07 | Disposition: A | Discharge status: Home / Self Care | Payer: Medicare HMO | Source: Ambulatory Visit | Attending: Radiation Oncology | Admitting: Radiation Oncology

## 2019-05-07 ENCOUNTER — Other Ambulatory Visit: Payer: Self-pay

## 2019-05-07 DIAGNOSIS — C320 Malignant neoplasm of glottis: Secondary | ICD-10-CM | POA: Diagnosis not present

## 2019-05-07 DIAGNOSIS — Z510 Encounter for antineoplastic radiation therapy: Secondary | ICD-10-CM | POA: Diagnosis not present

## 2019-05-08 ENCOUNTER — Ambulatory Visit
Admission: RE | Admit: 2019-05-08 | Discharge: 2019-05-08 | Disposition: A | Discharge status: Home / Self Care | Payer: Medicare HMO | Source: Ambulatory Visit | Attending: Radiation Oncology | Admitting: Radiation Oncology

## 2019-05-08 DIAGNOSIS — Z510 Encounter for antineoplastic radiation therapy: Secondary | ICD-10-CM | POA: Diagnosis not present

## 2019-05-08 DIAGNOSIS — C320 Malignant neoplasm of glottis: Secondary | ICD-10-CM | POA: Diagnosis not present

## 2019-05-11 ENCOUNTER — Encounter (HOSPITAL_COMMUNITY): Payer: Medicare HMO | Admitting: Physical Therapy

## 2019-05-11 ENCOUNTER — Ambulatory Visit
Admission: RE | Admit: 2019-05-11 | Discharge: 2019-05-11 | Disposition: A | Discharge status: Home / Self Care | Payer: Medicare HMO | Source: Ambulatory Visit | Attending: Radiation Oncology | Admitting: Radiation Oncology

## 2019-05-11 DIAGNOSIS — Z510 Encounter for antineoplastic radiation therapy: Secondary | ICD-10-CM | POA: Diagnosis not present

## 2019-05-11 DIAGNOSIS — C320 Malignant neoplasm of glottis: Secondary | ICD-10-CM | POA: Diagnosis not present

## 2019-05-12 ENCOUNTER — Inpatient Hospital Stay: Payer: Medicare HMO | Admitting: Nutrition

## 2019-05-12 ENCOUNTER — Ambulatory Visit
Admission: RE | Admit: 2019-05-12 | Discharge: 2019-05-12 | Disposition: A | Discharge status: Home / Self Care | Payer: Medicare HMO | Source: Ambulatory Visit | Attending: Radiation Oncology | Admitting: Radiation Oncology

## 2019-05-12 ENCOUNTER — Other Ambulatory Visit: Payer: Self-pay

## 2019-05-12 DIAGNOSIS — C320 Malignant neoplasm of glottis: Secondary | ICD-10-CM | POA: Diagnosis not present

## 2019-05-12 DIAGNOSIS — Z930 Tracheostomy status: Secondary | ICD-10-CM | POA: Diagnosis not present

## 2019-05-12 DIAGNOSIS — C801 Malignant (primary) neoplasm, unspecified: Secondary | ICD-10-CM | POA: Diagnosis not present

## 2019-05-12 DIAGNOSIS — R490 Dysphonia: Secondary | ICD-10-CM | POA: Diagnosis not present

## 2019-05-12 DIAGNOSIS — J383 Other diseases of vocal cords: Secondary | ICD-10-CM | POA: Diagnosis not present

## 2019-05-12 DIAGNOSIS — Z510 Encounter for antineoplastic radiation therapy: Secondary | ICD-10-CM | POA: Diagnosis not present

## 2019-05-12 DIAGNOSIS — J3801 Paralysis of vocal cords and larynx, unilateral: Secondary | ICD-10-CM | POA: Diagnosis not present

## 2019-05-12 NOTE — Progress Notes (Signed)
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 Patient reports she has lost 2 more pounds this week with weight documented as 110 pounds. Patient continues to eat small amounts of food.  She has not started drinking Ensure Enlive yet. She is struggling with thicker mucus.  Nutrition diagnosis: Unintended weight loss continues.  Intervention: Educated patient on the importance of increasing Ensure Enlive to twice daily between meals every day. Provided 1 complementary case. Educated patient on strategies for improving dry mouth.  Provided fact sheet.  Monitoring, evaluation, goals: Patient will tolerate increased calories and protein to minimize further weight loss.  Next visit: Wednesday, January 27.  **Disclaimer: This note was dictated with voice recognition software. Similar sounding words can inadvertently be transcribed and this note may contain transcription errors which may not have been corrected upon publication of note.**

## 2019-05-13 ENCOUNTER — Ambulatory Visit
Admission: RE | Admit: 2019-05-13 | Discharge: 2019-05-13 | Disposition: A | Discharge status: Home / Self Care | Payer: Medicare HMO | Source: Ambulatory Visit | Attending: Radiation Oncology | Admitting: Radiation Oncology

## 2019-05-13 ENCOUNTER — Other Ambulatory Visit: Payer: Self-pay

## 2019-05-13 ENCOUNTER — Encounter (HOSPITAL_COMMUNITY): Payer: Medicare HMO | Admitting: Physical Therapy

## 2019-05-13 ENCOUNTER — Ambulatory Visit (HOSPITAL_COMMUNITY): Payer: Medicare HMO | Admitting: Speech Pathology

## 2019-05-13 DIAGNOSIS — C320 Malignant neoplasm of glottis: Secondary | ICD-10-CM | POA: Diagnosis not present

## 2019-05-13 DIAGNOSIS — Z510 Encounter for antineoplastic radiation therapy: Secondary | ICD-10-CM | POA: Diagnosis not present

## 2019-05-14 ENCOUNTER — Encounter (HOSPITAL_COMMUNITY): Payer: Medicare HMO | Admitting: Physical Therapy

## 2019-05-14 ENCOUNTER — Ambulatory Visit
Admission: RE | Admit: 2019-05-14 | Discharge: 2019-05-14 | Disposition: A | Discharge status: Home / Self Care | Payer: Medicare HMO | Source: Ambulatory Visit | Attending: Radiation Oncology | Admitting: Radiation Oncology

## 2019-05-14 ENCOUNTER — Other Ambulatory Visit: Payer: Self-pay

## 2019-05-14 DIAGNOSIS — C320 Malignant neoplasm of glottis: Secondary | ICD-10-CM | POA: Diagnosis not present

## 2019-05-14 DIAGNOSIS — Z510 Encounter for antineoplastic radiation therapy: Secondary | ICD-10-CM | POA: Diagnosis not present

## 2019-05-15 ENCOUNTER — Other Ambulatory Visit: Payer: Self-pay

## 2019-05-15 ENCOUNTER — Ambulatory Visit
Admission: RE | Admit: 2019-05-15 | Discharge: 2019-05-15 | Disposition: A | Discharge status: Home / Self Care | Payer: Medicare HMO | Source: Ambulatory Visit | Attending: Radiation Oncology | Admitting: Radiation Oncology

## 2019-05-15 DIAGNOSIS — Z510 Encounter for antineoplastic radiation therapy: Secondary | ICD-10-CM | POA: Diagnosis not present

## 2019-05-15 DIAGNOSIS — C320 Malignant neoplasm of glottis: Secondary | ICD-10-CM | POA: Diagnosis not present

## 2019-05-18 ENCOUNTER — Other Ambulatory Visit: Payer: Self-pay

## 2019-05-18 ENCOUNTER — Encounter (HOSPITAL_COMMUNITY): Payer: Medicare HMO | Admitting: Physical Therapy

## 2019-05-18 ENCOUNTER — Ambulatory Visit
Admission: RE | Admit: 2019-05-18 | Discharge: 2019-05-18 | Disposition: A | Discharge status: Home / Self Care | Payer: Medicare HMO | Source: Ambulatory Visit | Attending: Radiation Oncology | Admitting: Radiation Oncology

## 2019-05-18 DIAGNOSIS — Z510 Encounter for antineoplastic radiation therapy: Secondary | ICD-10-CM | POA: Diagnosis not present

## 2019-05-18 DIAGNOSIS — C320 Malignant neoplasm of glottis: Secondary | ICD-10-CM | POA: Diagnosis not present

## 2019-05-19 ENCOUNTER — Other Ambulatory Visit: Payer: Self-pay

## 2019-05-19 ENCOUNTER — Ambulatory Visit (HOSPITAL_COMMUNITY): Payer: Medicare HMO | Admitting: Physical Therapy

## 2019-05-19 ENCOUNTER — Ambulatory Visit
Admission: RE | Admit: 2019-05-19 | Discharge: 2019-05-19 | Disposition: A | Discharge status: Home / Self Care | Payer: Medicare HMO | Source: Ambulatory Visit | Attending: Radiation Oncology | Admitting: Radiation Oncology

## 2019-05-19 DIAGNOSIS — I890 Lymphedema, not elsewhere classified: Secondary | ICD-10-CM

## 2019-05-19 DIAGNOSIS — M436 Torticollis: Secondary | ICD-10-CM

## 2019-05-19 DIAGNOSIS — C320 Malignant neoplasm of glottis: Secondary | ICD-10-CM | POA: Diagnosis not present

## 2019-05-19 DIAGNOSIS — R1313 Dysphagia, pharyngeal phase: Secondary | ICD-10-CM | POA: Diagnosis not present

## 2019-05-19 DIAGNOSIS — Z510 Encounter for antineoplastic radiation therapy: Secondary | ICD-10-CM | POA: Diagnosis not present

## 2019-05-19 NOTE — Therapy (Addendum)
[place], [place], [address] Phone: [phone]   Fax:  [phone]  Physical Therapy Treatment  Patient Details  Name: Melody Ward MRN: [id number] Date of Birth: 1951-03-14 Referring Provider (PT): Eppie Gibson    Encounter Date: 05/19/2019  PT End of Session - 05/19/19 1635    Visit Number  3    Number of Visits  12    Date for PT Re-Evaluation  05/26/19    Authorization Type  Aetna medicare    Authorization - Visit Number  3    Authorization - Number of Visits  10    PT Start Time  2505    PT Stop Time  1620    PT Time Calculation (min)  40 min    Activity Tolerance  Patient tolerated treatment well    Behavior During Therapy  [facility] for tasks assessed/performed       Past Medical History:  Diagnosis Date  . melanoma 1990    Past Surgical History:  Procedure Laterality Date  . CESAREAN SECTION    . FREE FLAP RADIAL FOREARM  03/06/2019   Dr. Nicolette Bang [facility]  . MODIFIED RADICAL NECK DISSECTION  03/06/2019  . NECK DISSECTION  03/06/2019   bilateral neck dissection  . NERVE GRAFT Left 03/06/2019   left arm lower, Dr. Nicolette Bang at [facility]  . PHARYNGECTOMY  03/06/2019   Dr. Nicolette Bang at [facility]  . TOTAL LARYNGECTOMY  03/06/2019   Dr. Nico[facility] Bang at [facility]  . 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 Nicolette Bang [facility] [address]  . WRIST SURGERY  06/06/2018   ORIF distal radius fracture    There were no vitals filed for this visit.  Subjective Assessment - 05/19/19 1626    Subjective  Pt states that she feels like she is suffacating when she uses the compression.  States that she has been trying to get the manual done once a day.    Pertinent History  laryngectomy, current radiation tx    Patient Stated Goals  less swelling, to be able to swallow better.            LYMPHEDEMA/ONCOLOGY QUESTIONNAIRE - 05/19/19 1539      Surgeries   Other Surgery Date  03/05/20    Number  Lymph Nodes Removed  47      Date Lymphedema/Swelling Started   Date  03/07/19      Treatment   Active Radiation Treatment  Yes      What other symptoms do you have   Are you Having Heaviness or Tightness  Yes    Are you having Pain  No    Are you having pitting edema  No    Is it Hard or Difficult finding clothes that fit  No    Do you have infections  --   past not now    Is there Decreased scar mobility  Yes      Lymphedema Assessments   Lymphedema Assessments  Head and Neck      Head and Neck   Right Lateral Nostril at base of nose to medial tragus   8.5 cm    Left Lateral Nostril at base of nose to medial tragus   8.2 cm    Right Corner of mouth to where ear lobe meets face  7.9 cm    Left Corner of mouth to where ear lobe meets face  7.6 cm    4 cm superior to sternal  notch around neck  --   mid 29 was  30.8   6 cm superior to sternal notch around neck  --   6cm 32 was 32.8               [facility] Adult PT Treatment/Exercise - 05/19/19 0001      Exercises   Exercises  Neck      Neck Exercises: Seated   Cervical Rotation  Both;5 reps    Lateral Flexion  Both;5 reps      Neck Exercises: Supine   Cervical Rotation  Both;5 reps    Lateral Flexion  Both;5 reps      Neck Exercises: Sidelying   Other Sidelying Exercise  cervical/upper thoracic rotation/extension combo B x 5 each       Manual Therapy   Manual Therapy  Joint mobilization;Manual Lymphatic Drainage (MLD);Compression Bandaging    Manual therapy comments  completed seperate from all other aspects of treatment     Joint Mobilization  for improved ROM C2-C6 B     Manual Lymphatic Drainage (MLD) For thoracic , cervical and facial areas             PT Education - 05/19/19 1634    Education Details  diaphragmic breathing and Cervical/upperthoracic extension/rotation exercise.    Person(s) Educated  Patient    Methods  Explanation       PT Short Term Goals - 05/19/19 1636      PT SHORT  TERM GOAL #1   Title  Pt to be I in wearing her compression four hours a day to decrease fibrosis of skin.    Time  2    Period  Weeks    Status  On-going    Target Date  05/12/19        PT Long Term Goals - 05/19/19 1636      PT LONG TERM GOAL #1   Title  PT to be I in self manual to decrease edema in cervical area.    Time  4    Period  Weeks    Status  Achieved      PT LONG TERM GOAL #2   Title  PT cervical rotation to improve 10 degrees both directions for safer driving    Time  4    Period  Weeks    Status  Partially Met      PT LONG TERM GOAL #3   Title  PT measurement to have decreased by 1 cm to demonstrate decreased cervical edema.    Time  4    Period  Weeks    Status  Partially Met            Plan - 05/19/19 1637    Clinical Impression Statement  Pt is not using compression due to feeling as if she is suffocating.  PT remeasured with noted decrease of volumes.  PT continues to have significant scar tissue in anterior neck and decreased ROM for which she will benefit from skilled therapy.    Examination-Activity Limitations  Other    Examination-Participation Restrictions  Other;Driving    Stability/Clinical Decision Making  Stable/Uncomplicated    Rehab Potential  Good    PT Frequency  3x / week    PT Duration  4 weeks    PT Treatment/Interventions  Manual techniques;Manual lymph drainage;Patient/family education    PT Next Visit Plan  Give pt option for off the shelf compression, answer any quesitons continue with manual to decrease edema,  scar tissue and improve motion of cervical spine.    PT Home Exercise Plan  manual and using compression       Patient will benefit from skilled therapeutic intervention in order to improve the following deficits and impairments:  Decreased skin integrity, Decreased range of motion, Increased edema  Visit Diagnosis: Lymphedema, not elsewhere classified  Stiffness of cervical spine     Problem List Patient  Active Problem List   Diagnosis Date Noted  . 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 Osteopenia 04/17/2016  . Sun-damaged skin 03/31/2016    [name], PT CLT [phone] 05/19/2019, 4:39 PM  [place] [address] [place], [place], [address] Phone: [phone]   Fax:  [phone]  Name: Melody Ward MRN: [id number] Date of Birth: 12/15/1950

## 2019-05-20 ENCOUNTER — Ambulatory Visit (HOSPITAL_COMMUNITY): Payer: Medicare HMO | Admitting: Physical Therapy

## 2019-05-20 ENCOUNTER — Ambulatory Visit
Admission: RE | Admit: 2019-05-20 | Discharge: 2019-05-20 | Disposition: A | Discharge status: Home / Self Care | Payer: Medicare HMO | Source: Ambulatory Visit | Attending: Radiation Oncology | Admitting: Radiation Oncology

## 2019-05-20 ENCOUNTER — Inpatient Hospital Stay: Payer: Medicare HMO | Admitting: Nutrition

## 2019-05-20 ENCOUNTER — Other Ambulatory Visit: Payer: Self-pay

## 2019-05-20 ENCOUNTER — Encounter (HOSPITAL_COMMUNITY): Payer: Self-pay | Admitting: Physical Therapy

## 2019-05-20 DIAGNOSIS — M436 Torticollis: Secondary | ICD-10-CM | POA: Diagnosis not present

## 2019-05-20 DIAGNOSIS — R1313 Dysphagia, pharyngeal phase: Secondary | ICD-10-CM | POA: Diagnosis not present

## 2019-05-20 DIAGNOSIS — I890 Lymphedema, not elsewhere classified: Secondary | ICD-10-CM | POA: Diagnosis not present

## 2019-05-20 DIAGNOSIS — Z510 Encounter for antineoplastic radiation therapy: Secondary | ICD-10-CM | POA: Diagnosis not present

## 2019-05-20 DIAGNOSIS — C320 Malignant neoplasm of glottis: Secondary | ICD-10-CM | POA: Diagnosis not present

## 2019-05-20 NOTE — Therapy (Signed)
[place], [place], [address] Phone: [phone]   Fax:  [phone]  Physical Therapy Treatment  Patient Details  Name: Melody Ward MRN: [id number] Date of Birth: 18-Feb-1951 Referring Provider (PT): Eppie Gibson    Encounter Date: 05/20/2019  PT End of Session - 05/20/19 1001    Visit Number  4    Number of Visits  12    Date for PT Re-Evaluation  05/26/19    Authorization Type  Aetna medicare    Authorization - Visit Number  4    Authorization - Number of Visits  10    PT Start Time  [phone]    PT Stop Time  1001    PT Time Calculation (min)  43 min    Activity Tolerance  Patient tolerated treatment well    Behavior During Therapy  [facility] for tasks assessed/performed       Past Medical History:  Diagnosis Date  . melanoma 1990    Past Surgical History:  Procedure Laterality Date  . CESAREAN SECTION    . FREE FLAP RADIAL FOREARM  03/06/2019   Dr. Nicolette Bang [facility]  . MODIFIED RADICAL NECK DISSECTION  03/06/2019  . NECK DISSECTION  03/06/2019   bilateral neck dissection  . NERVE GRAFT Left 03/06/2019   left arm lower, [facility] at [facility]  . PHARYNGECTOMY  03/06/2019   Dr. Nico[facility] Bang at [facility]  . TOTAL LARYNGECTOMY  03/06/2019   Dr. Nicolette Bang at [facility]  . 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 Nicolette Bang [facility]  . WRIST SURGERY  06/06/2018   ORIF distal radius fracture    There were no vitals filed for this visit.  Subjective Assessment - 05/20/19 0959    Subjective  Pt states that she tried to use her compression last night and it did alright.    Pertinent History  laryngectomy, current radiation tx    Patient Stated Goals  less swelling, to be able to swallow better.                [facility] Adult PT Treatment/Exercise - 05/20/19 0001      Exercises   Exercises  Neck      Neck Exercises: Seated   Cervical Rotation  Both;5 reps    Lateral Flexion   Both;5 reps    Lateral Flexion Limitations  extension x 5       Neck Exercises: Supine   Cervical Rotation  Both;5 reps    Lateral Flexion  Both;5 reps      Neck Exercises: Sidelying   Other Sidelying Exercise  --      Manual Therapy   Manual Therapy  Joint mobilization;Manual Lymphatic Drainage (MLD);Compression Bandaging    Manual therapy comments  completed seperate from all other aspects of treatment     Joint Mobilization  for improved ROM C2-C6 B     Manual Lymphatic Drainage (MLD)  for thoracic/ cervical and facial                PT Short Term Goals - 05/19/19 1636      PT SHORT TERM GOAL #1   Title  Pt to be I in wearing her compression four hours a day to decrease fibrosis of skin.    Time  2    Period  Weeks    Status  On-going    Target Date  05/12/19        PT Long Term  Goals - 05/19/19 1636      PT LONG TERM GOAL #1   Title  PT to be I in self manual to decrease edema in cervical area.    Time  4    Period  Weeks    Status  Achieved      PT LONG TERM GOAL #2   Title  PT cervical rotation to improve 10 degrees both directions for safer driving    Time  4    Period  Weeks    Status  Partially Met      PT LONG TERM GOAL #3   Title  PT measurement to have decreased by 1 cm to demonstrate decreased cervical edema.    Time  4    Period  Weeks    Status  Partially Met            Plan - 05/20/19 1002    Clinical Impression Statement  PT instructed in scar massage and reviewed cervical ROM exercises.  Completed manual for decongestion with scar restriction impeding proper lymphatic flow.  PT given over the counter options for compression.    Examination-Activity Limitations  Other    Examination-Participation Restrictions  Other;Driving    Stability/Clinical Decision Making  Stable/Uncomplicated    Rehab Potential  Good    PT Frequency  3x / week    PT Duration  4 weeks    PT Treatment/Interventions  Manual techniques;Manual lymph  drainage;Patient/family education    PT Next Visit Plan  Continue with scar massage as well as decongestive techniques.    PT Home Exercise Plan  manual and using compression       Patient will benefit from skilled therapeutic intervention in order to improve the following deficits and impairments:  Decreased skin integrity, Decreased range of motion, Increased edema  Visit Diagnosis: Lymphedema, not elsewhere classified  Stiffness of cervical spine     Problem List Patient Active Problem List   Diagnosis Date Noted  . 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 Osteopenia 04/17/2016  . Sun-damaged skin 03/31/2016   [name], PT CLT [phone] 05/20/2019, 10:04 AM  [place] [address] [place], [place], [address] Phone: [phone]   Fax:  [phone]  Name: Melody Ward MRN: [id number] Date of Birth: 1950/10/25

## 2019-05-20 NOTE — Progress Notes (Signed)
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 Patient reports she continues to eat despite minimal taste. She reports she is swallowing okay. She is trying to drink 2 Ensure Enlive daily. Weight was documented as 110.4 pounds today.  Nutrition diagnosis: Unintended weight loss continues.  Intervention: Recommended patient continue minimum Ensure Enlive twice daily. Encourage patient to contact me when she needs an additional case of product. Encouraged small frequent meals and snacks.  Monitoring, evaluation, goals: Patient will work to increase calories and protein to minimize weight loss.  Next visit: Tuesday, February 2 after radiation therapy.  **Disclaimer: This note was dictated with voice recognition software. Similar sounding words can inadvertently be transcribed and this note may contain transcription errors which may not have been corrected upon publication of note.**

## 2019-05-21 ENCOUNTER — Other Ambulatory Visit: Payer: Self-pay

## 2019-05-21 ENCOUNTER — Ambulatory Visit
Admission: RE | Admit: 2019-05-21 | Discharge: 2019-05-21 | Disposition: A | Discharge status: Home / Self Care | Payer: Medicare HMO | Source: Ambulatory Visit | Attending: Radiation Oncology | Admitting: Radiation Oncology

## 2019-05-21 DIAGNOSIS — Z510 Encounter for antineoplastic radiation therapy: Secondary | ICD-10-CM | POA: Diagnosis not present

## 2019-05-21 DIAGNOSIS — C320 Malignant neoplasm of glottis: Secondary | ICD-10-CM | POA: Diagnosis not present

## 2019-05-22 ENCOUNTER — Ambulatory Visit (HOSPITAL_COMMUNITY): Payer: Medicare HMO | Admitting: Physical Therapy

## 2019-05-22 ENCOUNTER — Encounter (HOSPITAL_COMMUNITY): Payer: Self-pay | Admitting: Physical Therapy

## 2019-05-22 ENCOUNTER — Ambulatory Visit
Admission: RE | Admit: 2019-05-22 | Discharge: 2019-05-22 | Disposition: A | Discharge status: Home / Self Care | Payer: Medicare HMO | Source: Ambulatory Visit | Attending: Radiation Oncology | Admitting: Radiation Oncology

## 2019-05-22 ENCOUNTER — Other Ambulatory Visit: Payer: Self-pay

## 2019-05-22 DIAGNOSIS — I890 Lymphedema, not elsewhere classified: Secondary | ICD-10-CM | POA: Diagnosis not present

## 2019-05-22 DIAGNOSIS — M436 Torticollis: Secondary | ICD-10-CM | POA: Diagnosis not present

## 2019-05-22 DIAGNOSIS — Z510 Encounter for antineoplastic radiation therapy: Secondary | ICD-10-CM | POA: Diagnosis not present

## 2019-05-22 DIAGNOSIS — C320 Malignant neoplasm of glottis: Secondary | ICD-10-CM | POA: Diagnosis not present

## 2019-05-22 DIAGNOSIS — R1313 Dysphagia, pharyngeal phase: Secondary | ICD-10-CM | POA: Diagnosis not present

## 2019-05-22 NOTE — Therapy (Signed)
[place], [place], [address] Phone: [phone]   Fax:  [phone]  Physical Therapy Treatment  Patient Details  Name: Melody Ward MRN: [id number] Date of Birth: 1950-07-08 Referring Provider (PT): Eppie Gibson    Encounter Date: 05/22/2019  PT End of Session - 05/22/19 1640    Visit Number  5    Number of Visits  12    Date for PT Re-Evaluation  05/26/19    Authorization Type  [employer]na medicare    Authorization - Visit Number  5    Authorization - Number of Visits  10    PT Start Time  4008    PT Stop Time  1620    PT Time Calculation (min)  42 min    Activity Tolerance  Patient tolerated treatment well    Behavior During Therapy  [facility] for tasks assessed/performed       Past Medical History:  Diagnosis Date  . melanoma 1990    Past Surgical History:  Procedure Laterality Date  . CESAREAN SECTION    . FREE FLAP RADIAL FOREARM  03/06/2019   Dr. Nicolette Bang [facility]  . MODIFIED RADICAL NECK DISSECTION  03/06/2019  . NECK DISSECTION  03/06/2019   bilateral neck dissection  . NERVE GRAFT Left 03/06/2019   left arm lower, Dr. Nicolette Bang at [facility]  . PHARYNGECTOMY  03/06/2019   Dr. [facility]ico[facility] Bang at [facility]  . TOTAL LARYNGECTOMY  03/06/2019   Dr. Nicolette Bang at [facility]  . 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 Nicolette Bang [facility]  . WRIST SURGERY  06/06/2018   ORIF distal radius fracture    There were no vitals filed for this visit.  Subjective Assessment - 05/22/19 1639    Subjective  PT states that she has been doing her exercises and may see a small difference.    Pertinent History  laryngectomy, current radiation tx    Patient Stated Goals  less swelling, to be able to swallow better.    Currently in Pain?  No/denies                       [facility] Adult PT Treatment/Exercise - 05/22/19 0001      Exercises   Exercises  Neck      Neck Exercises: Seated   Cervical  Rotation  Both;5 reps    Lateral Flexion  Both;5 reps    Lateral Flexion Limitations  extension x 5       Neck Exercises: Supine   Cervical Rotation  Both;5 reps    Lateral Flexion  Both;5 reps      Manual Therapy   Manual Therapy  Joint mobilization;Soft tissue mobilization;Manual Lymphatic Drainage (MLD);Compression Bandaging    Manual therapy comments  completed seperate from all other aspects of treatment     Joint Mobilization  for improved ROM C2-C6 B     Soft tissue mobilization  for scar area with encouragement to pt to complete self massage to scar area.    Manual Lymphatic Drainage (MLD)  for thoracic/ cervical and facial                PT Short Term Goals - 05/19/19 1636      PT SHORT TERM GOAL #1   Title  Pt to be I in wearing her compression four hours a day to decrease fibrosis of skin.    Time  2    Period  Weeks  Status  On-going    Target Date  05/12/19        PT Long Term Goals - 05/19/19 1636      PT LONG TERM GOAL #1   Title  PT to be I in self manual to decrease edema in cervical area.    Time  4    Period  Weeks    Status  Achieved      PT LONG TERM GOAL #2   Title  PT cervical rotation to improve 10 degrees both directions for safer driving    Time  4    Period  Weeks    Status  Partially Met      PT LONG TERM GOAL #3   Title  PT measurement to have decreased by 1 cm to demonstrate decreased cervical edema.    Time  4    Period  Weeks    Status  Partially Met            Plan - 05/22/19 1641    Clinical Impression Statement  Pt anterior scars are inhibiting lymphatic flow therefore encourage to route fluid posterior.  Significant scar tissue impeding lymphatic flow.    Examination-Activity Limitations  Other    Examination-Participation Restrictions  Other;Driving    Stability/Clinical Decision Making  Stable/Uncomplicated    Rehab Potential  Good    PT Frequency  3x / week    PT Duration  4 weeks    PT  Treatment/Interventions  Manual techniques;Manual lymph drainage;Patient/family education    PT Next Visit Plan  Continue with scar massage as well as decongestive techniques.  Measure Cervical ROM and volumes weekly    PT Home Exercise Plan  manual and using compression       Patient will benefit from skilled therapeutic intervention in order to improve the following deficits and impairments:  Decreased skin integrity, Decreased range of motion, Increased edema  Visit Diagnosis: Lymphedema, not elsewhere classified  Stiffness of cervical spine     Problem List Patient Active Problem List   Diagnosis Date Noted  . 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 Osteopenia 04/17/2016  . Sun-damaged skin 03/31/2016    [name], PT CLT [phone] 05/22/2019, 4:43 PM  [place] [address] [place], [place], [address] Phone: [phone]   Fax:  [phone]  Name: Melody Ward MRN: [id number] Date of Birth: Aug 30, 1950

## 2019-05-25 ENCOUNTER — Encounter (HOSPITAL_COMMUNITY): Payer: Self-pay | Admitting: Physical Therapy

## 2019-05-25 ENCOUNTER — Ambulatory Visit (HOSPITAL_COMMUNITY): Payer: Medicare HMO | Attending: Radiation Oncology | Admitting: Physical Therapy

## 2019-05-25 ENCOUNTER — Other Ambulatory Visit: Payer: Self-pay

## 2019-05-25 ENCOUNTER — Ambulatory Visit
Admission: RE | Admit: 2019-05-25 | Discharge: 2019-05-25 | Disposition: A | Discharge status: Home / Self Care | Payer: Medicare HMO | Source: Ambulatory Visit | Attending: Radiation Oncology | Admitting: Radiation Oncology

## 2019-05-25 DIAGNOSIS — I890 Lymphedema, not elsewhere classified: Secondary | ICD-10-CM | POA: Diagnosis not present

## 2019-05-25 DIAGNOSIS — M436 Torticollis: Secondary | ICD-10-CM | POA: Diagnosis not present

## 2019-05-25 DIAGNOSIS — R1313 Dysphagia, pharyngeal phase: Secondary | ICD-10-CM | POA: Insufficient documentation

## 2019-05-25 DIAGNOSIS — Z510 Encounter for antineoplastic radiation therapy: Secondary | ICD-10-CM | POA: Insufficient documentation

## 2019-05-25 DIAGNOSIS — C320 Malignant neoplasm of glottis: Secondary | ICD-10-CM | POA: Insufficient documentation

## 2019-05-25 NOTE — Therapy (Signed)
[place], [place], [address] Phone: [phone]   Fax:  [phone]  Physical Therapy Treatment  Patient Details  Name: Melody Ward MRN: [id number] Date of Birth: 01-Mar-1951 Referring Provider (PT): Eppie Gibson    Encounter Date: 05/25/2019  PT End of Session - 05/25/19 1406    Visit Number  6    Number of Visits  12    Date for PT Re-Evaluation  05/26/19    Authorization Type  [employer]na medicare    Authorization - Visit Number  6    Authorization - Number of Visits  10    PT Start Time  [id number]    PT Stop Time  1400    PT Time Calculation (min)  45 min    Activity Tolerance  Patient tolerated treatment well    Behavior During Therapy  [facility] for tasks assessed/performed       Past Medical History:  Diagnosis Date  . melanoma 1990    Past Surgical History:  Procedure Laterality Date  . CESAREAN SECTION    . FREE FLAP RADIAL FOREARM  03/06/2019   Dr. Nicolette Bang [facility]  . MODIFIED RADICAL NECK DISSECTION  03/06/2019  . NECK DISSECTION  03/06/2019   bilateral neck dissection  . NERVE GRAFT Left 03/06/2019   left arm lower, [facility] at [facility]  . PHARYNGECTOMY  03/06/2019   Dr[facility] Nico[facility] Bang at [facility]  . TOTAL LARYNGECTOMY  03/06/2019   Dr. Nicolette Bang at [facility]  . 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 Nicolette Bang [facility]  . WRIST SURGERY  06/06/2018   ORIF distal radius fracture    There were no vitals filed for this visit.  Subjective Assessment - 05/25/19 1322    Subjective  PT has been working with her scar tissue but thought that that negated her lymphedma    Pertinent History  laryngectomy, current radiation tx    Patient Stated Goals  less swelling, to be able to swallow better.    Currently in Pain?  No/denies         [facility] PT Assessment - 05/25/19 0001      AROM   Cervical Flexion  45   was 35    Cervical Extension  48    Cervical - Right Side Bend  25   was  20   Cervical - Left Side Bend  30   was 20    Cervical - Right Rotation  60   was 48   Cervical - Left Rotation  55   45       LYMPHEDEMA/ONCOLOGY QUESTIONNAIRE - 05/25/19 1322      Surgeries   Other Surgery Date  03/05/20    Number Lymph Nodes Removed  47      Date Lymphedema/Swelling Started   Date  03/07/19      Treatment   Active Radiation Treatment  Yes      What other symptoms do you have   Are you Having Heaviness or Tightness  Yes    Are you having Pain  No    Are you having pitting edema  No    Is it Hard or Difficult finding clothes that fit  No    Do you have infections  --   past not now    Is there Decreased scar mobility  Yes      Lymphedema Assessments   Lymphedema Assessments  Head and Neck  Head and Neck   Right Lateral Nostril at base of nose to medial tragus   8.5 cm    Left Lateral Nostril at base of nose to medial tragus   8.3 cm    Right Corner of mouth to where ear lobe meets face  7.9 cm    Left Corner of mouth to where ear lobe meets face  7.6 cm    4 cm superior to sternal notch around neck  29.3 cm   mid 29 was  30.8   6 cm superior to sternal notch around neck  30.8 cm   6cm 32 was 32.8               [facility] Adult PT Treatment/Exercise - 05/25/19 0001      Exercises   Exercises  Neck      Neck Exercises: Seated   Cervical Rotation  Both;5 reps    Lateral Flexion  Both;5 reps    Lateral Flexion Limitations  extension x 5       Neck Exercises: Supine   Cervical Rotation  Both;5 reps    Lateral Flexion  Both;5 reps      Manual Therapy   Manual Therapy  Joint mobilization;Soft tissue mobilization;Manual Lymphatic Drainage (MLD);Compression Bandaging    Manual therapy comments  completed seperate from all other aspects of treatment     Joint Mobilization  for improved ROM C2-C6 B     Soft tissue mobilization  for scar area with encouragement to pt to complete self massage to scar area.    Manual Lymphatic Drainage  (MLD)  for thoracic/ cervical and facial                PT Short Term Goals - 05/25/19 1408      PT SHORT TERM GOAL #1   Title  Pt to be I in wearing her compression four hours a day to decrease fibrosis of skin.    Time  2    Period  Weeks    Status  Achieved    Target Date  05/12/19        PT Long Term Goals - 05/25/19 1408      PT LONG TERM GOAL #1   Title  PT to be I in self manual to decrease edema in cervical area.    Time  4    Period  Weeks    Status  Achieved      PT LONG TERM GOAL #2   Title  PT cervical rotation to improve 10 degrees both directions for safer driving    Time  4    Period  Weeks    Status  Achieved      PT LONG TERM GOAL #3   Title  PT measurement to have decreased by 1 cm to demonstrate decreased cervical edema.    Time  4    Period  Weeks    Status  Achieved      PT LONG TERM GOAL #4   Title  PT to decrease cervical volume by 3 cm    Time  2    Period  Weeks    Status  New    Target Date  06/08/19            Plan - 05/25/19 1407    Clinical Impression Statement  PT measured today with noted decreased volume in superior neck area.  Induration in cervical area decrased after manual.  Pt will continue to benefit  from skilled PT    Examination-Activity Limitations  Other    Examination-Participation Restrictions  Other;Driving    Stability/Clinical Decision Making  Stable/Uncomplicated    Rehab Potential  Good    PT Frequency  3x / week    PT Duration  4 weeks    PT Treatment/Interventions  Manual techniques;Manual lymph drainage;Patient/family education    PT Next Visit Plan  Continue with scar massage as well as decongestive techniques.  Measure Cervical ROM and volumes weekly    PT Home Exercise Plan  manual and using compression       Patient will benefit from skilled therapeutic intervention in order to improve the following deficits and impairments:  Decreased skin integrity, Decreased range of motion, Increased  edema  Visit Diagnosis: Lymphedema, not elsewhere classified  Stiffness of cervical spine     Problem List Patient Active Problem List   Diagnosis Date Noted  . 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 Osteopenia 04/17/2016  . Sun-damaged skin 03/31/2016    [name], PT CLT [phone] 05/25/2019, 2:09 PM  [place] [address] [place], [place], [address] Phone: [phone]   Fax:  [phone]  Name: Melody Ward MRN: [id number] Date of Birth: 1950-10-04

## 2019-05-26 ENCOUNTER — Ambulatory Visit
Admission: RE | Admit: 2019-05-26 | Discharge: 2019-05-26 | Disposition: A | Discharge status: Home / Self Care | Payer: Medicare HMO | Source: Ambulatory Visit | Attending: Radiation Oncology | Admitting: Radiation Oncology

## 2019-05-26 ENCOUNTER — Other Ambulatory Visit: Payer: Self-pay

## 2019-05-26 ENCOUNTER — Ambulatory Visit: Payer: Medicare HMO | Admitting: Nutrition

## 2019-05-26 DIAGNOSIS — C320 Malignant neoplasm of glottis: Secondary | ICD-10-CM | POA: Diagnosis not present

## 2019-05-26 NOTE — Progress Notes (Signed)
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 Patient finding it more difficult to consume adequate calories and protein. Verbalizes she knows she should increase Ensure Enlive to 4 bottles daily. Is requesting an additional case of Ensure Enlive samples. Weight was decreased and documented as 108.4 pounds February 1 decreased from 110.4 pounds January 27.  Nutrition diagnosis: Unintended weight loss continues.  Intervention: Encourage patient to continue strategies for increased calories and protein intake throughout the day. Agree patient should increase Ensure Enlive 4 times daily between meals. Provided 1 complementary case of Ensure Enlive.  Monitoring, evaluation, goals: Patient will work to increase calories and protein to minimize further weight loss.  Next visit: Follow-up is Monday, February 8.  **Disclaimer: This note was dictated with voice recognition software. Similar sounding words can inadvertently be transcribed and this note may contain transcription errors which may not have been corrected upon publication of note.**

## 2019-05-27 ENCOUNTER — Ambulatory Visit (HOSPITAL_COMMUNITY): Payer: Medicare HMO | Admitting: Physical Therapy

## 2019-05-27 ENCOUNTER — Encounter (HOSPITAL_COMMUNITY): Payer: Self-pay | Admitting: Physical Therapy

## 2019-05-27 ENCOUNTER — Ambulatory Visit
Admission: RE | Admit: 2019-05-27 | Discharge: 2019-05-27 | Disposition: A | Discharge status: Home / Self Care | Payer: Medicare HMO | Source: Ambulatory Visit | Attending: Radiation Oncology | Admitting: Radiation Oncology

## 2019-05-27 ENCOUNTER — Other Ambulatory Visit: Payer: Self-pay

## 2019-05-27 DIAGNOSIS — I890 Lymphedema, not elsewhere classified: Secondary | ICD-10-CM

## 2019-05-27 DIAGNOSIS — R1313 Dysphagia, pharyngeal phase: Secondary | ICD-10-CM | POA: Diagnosis not present

## 2019-05-27 DIAGNOSIS — M436 Torticollis: Secondary | ICD-10-CM | POA: Diagnosis not present

## 2019-05-27 DIAGNOSIS — C320 Malignant neoplasm of glottis: Secondary | ICD-10-CM | POA: Diagnosis not present

## 2019-05-27 NOTE — Therapy (Signed)
[place], [place], [address] Phone: [phone]   Fax:  [phone]  Physical Therapy Treatment  Patient Details  Name: Melody Ward MRN: [id number] Date of Birth: May 06, 1950 Referring Provider (PT): Eppie Gibson    Encounter Date: 05/27/2019  PT End of Session - 05/27/19 1424    Visit Number  7    Number of Visits  12    Date for PT Re-Evaluation  05/26/19    Authorization Type  [employer]na medicare    Authorization - Visit Number  7    Authorization - Number of Visits  10    PT Start Time  1320    PT Stop Time  1400    PT Time Calculation (min)  40 min    Activity Tolerance  Patient tolerated treatment well    Behavior During Therapy  [facility] for tasks assessed/performed       Past Medical History:  Diagnosis Date  . melanoma 1990    Past Surgical History:  Procedure Laterality Date  . CESAREAN SECTION    . FREE FLAP RADIAL FOREARM  03/06/2019   Dr. Nicolette Bang [facility]  . MODIFIED RADICAL NECK DISSECTION  03/06/2019  . NECK DISSECTION  03/06/2019   bilateral neck dissection  . NERVE GRAFT Left 03/06/2019   left arm lower, Dr. Nico[facility] Bang at [facility]  . PHARYNGECTOMY  03/06/2019   Dr. Nicolette Bang at [facility]  . TOTAL LARYNGECTOMY  03/06/2019   [facility] at [facility]  . 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 Nicolette Bang [facility]  . WRIST SURGERY  06/06/2018   ORIF distal radius fracture    There were no vitals filed for this visit.  Subjective Assessment - 05/27/19 1423    Subjective  PT states that she only has three more radiation treatments    Pertinent History  laryngectomy, current radiation tx    Patient Stated Goals  less swelling, to be able to swallow better.    Currently in Pain?  Yes            [facility] Adult PT Treatment/Exercise - 05/27/19 0001      Exercises   Exercises  Neck      Neck Exercises: Seated   Cervical Rotation  Both;5 reps    Lateral Flexion   Both;5 reps    Lateral Flexion Limitations  extension x 5       Neck Exercises: Supine   Cervical Rotation  Both;5 reps    Lateral Flexion  Both;5 reps      Manual Therapy   Manual Therapy  Joint mobilization;Soft tissue mobilization;Manual Lymphatic Drainage (MLD);Compression Bandaging    Manual therapy comments  completed seperate from all other aspects of treatment     Joint Mobilization  for improved ROM C2-C6 B     Soft tissue mobilization  for scar area with encouragement to pt to complete self massage to scar area.    Manual Lymphatic Drainage (MLD)  for thoracic/ cervical and facial                PT Short Term Goals - 05/25/19 1408      PT SHORT TERM GOAL #1   Title  Pt to be I in wearing her compression four hours a day to decrease fibrosis of skin.    Time  2    Period  Weeks    Status  Achieved    Target Date  05/12/19  PT Long Term Goals - 05/25/19 1408      PT LONG TERM GOAL #1   Title  PT to be I in self manual to decrease edema in cervical area.    Time  4    Period  Weeks    Status  Achieved      PT LONG TERM GOAL #2   Title  PT cervical rotation to improve 10 degrees both directions for safer driving    Time  4    Period  Weeks    Status  Achieved      PT LONG TERM GOAL #3   Title  PT measurement to have decreased by 1 cm to demonstrate decreased cervical edema.    Time  4    Period  Weeks    Status  Achieved      PT LONG TERM GOAL #4   Title  PT to decrease cervical volume by 3 cm    Time  2    Period  Weeks    Status  New    Target Date  06/08/19            Plan - 05/27/19 1426    Clinical Impression Statement  Scarring continues to be the most limiting factor.  Therapist worked on both scar massage as well as lymph massage to decrease volumes.    Examination-Activity Limitations  Other    Examination-Participation Restrictions  Other;Driving    Stability/Clinical Decision Making  Stable/Uncomplicated    Rehab  Potential  Good    PT Frequency  3x / week    PT Duration  4 weeks    PT Treatment/Interventions  Manual techniques;Manual lymph drainage;Patient/family education    PT Next Visit Plan  Continue with scar massage as well as decongestive techniques.  Measure Cervical ROM and volumes weekly    PT Home Exercise Plan  manual and using compression       Patient will benefit from skilled therapeutic intervention in order to improve the following deficits and impairments:  Decreased skin integrity, Decreased range of motion, Increased edema  Visit Diagnosis: Lymphedema, not elsewhere classified  Stiffness of cervical spine     Problem List Patient Active Problem List   Diagnosis Date Noted  . 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 Osteopenia 04/17/2016  . Sun-damaged skin 03/31/2016   [name], PT CLT [phone] 05/27/2019, 2:28 PM  [place] [address] [place], [place], [address] Phone: [phone]   Fax:  [phone]  Name: Melody Ward MRN: [id number] Date of Birth: 06-30-1950

## 2019-05-28 ENCOUNTER — Ambulatory Visit
Admission: RE | Admit: 2019-05-28 | Discharge: 2019-05-28 | Disposition: A | Discharge status: Home / Self Care | Payer: Medicare HMO | Source: Ambulatory Visit | Attending: Radiation Oncology | Admitting: Radiation Oncology

## 2019-05-28 ENCOUNTER — Encounter: Payer: Self-pay | Admitting: Family Medicine

## 2019-05-28 DIAGNOSIS — C320 Malignant neoplasm of glottis: Secondary | ICD-10-CM | POA: Diagnosis not present

## 2019-05-29 ENCOUNTER — Ambulatory Visit (HOSPITAL_COMMUNITY): Payer: Medicare HMO | Admitting: Physical Therapy

## 2019-05-29 ENCOUNTER — Encounter (HOSPITAL_COMMUNITY): Payer: Self-pay | Admitting: Physical Therapy

## 2019-05-29 ENCOUNTER — Other Ambulatory Visit: Payer: Self-pay

## 2019-05-29 ENCOUNTER — Ambulatory Visit
Admission: RE | Admit: 2019-05-29 | Discharge: 2019-05-29 | Disposition: A | Discharge status: Home / Self Care | Payer: Medicare HMO | Source: Ambulatory Visit | Attending: Radiation Oncology | Admitting: Radiation Oncology

## 2019-05-29 DIAGNOSIS — C320 Malignant neoplasm of glottis: Secondary | ICD-10-CM | POA: Diagnosis not present

## 2019-05-29 DIAGNOSIS — I890 Lymphedema, not elsewhere classified: Secondary | ICD-10-CM

## 2019-05-29 DIAGNOSIS — M436 Torticollis: Secondary | ICD-10-CM

## 2019-05-29 DIAGNOSIS — R1313 Dysphagia, pharyngeal phase: Secondary | ICD-10-CM | POA: Diagnosis not present

## 2019-05-29 MED ORDER — SONAFINE EX EMUL
1.0000 "application " | Freq: Once | CUTANEOUS | Status: AC
  Administered 2019-05-29: 1 via TOPICAL

## 2019-05-29 NOTE — Therapy (Signed)
[place], [place], [address] Phone: [phone]   Fax:  [phone]  Physical Therapy Treatment  Patient Details  Name: Melody Ward MRN: [id number] Date of Birth: October 15, 1950 Referring Provider (PT): Eppie Gibson    Encounter Date: 05/29/2019  PT End of Session - 05/29/19 1246    Visit Number  8    Number of Visits  12    Date for PT Re-Evaluation  05/26/19    Authorization Type  A[employer]na medicare    Authorization - Visit Number  8    Authorization - Number of Visits  10    PT Start Time  [id number]    PT Stop Time  1215    PT Time Calculation (min)  40 min    Activity Tolerance  Patient tolerated treatment well    Behavior During Therapy  [facility] for tasks assessed/performed       Past Medical History:  Diagnosis Date  . melanoma 1990    Past Surgical History:  Procedure Laterality Date  . CESAREAN SECTION    . FREE FLAP RADIAL FOREARM  03/06/2019   Dr. Nicolette Bang [facility]  . MODIFIED RADICAL NECK DISSECTION  03/06/2019  . NECK DISSECTION  03/06/2019   bilateral neck dissection  . NERVE GRAFT Left 03/06/2019   left arm lower, Dr[facility] Nico[facility] Bang at [facility]  . PHARYNGECTOMY  03/06/2019   [facility] at [facility]  . TOTAL LARYNGECTOMY  03/06/2019   Dr. Nicolette Bang at [facility]  . 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 Nicolette Bang [facility]  . WRIST SURGERY  06/06/2018   ORIF distal radius fracture    There were no vitals filed for this visit.  Subjective Assessment - 05/29/19 1244    Subjective  Radiation is over next week.  Pt is trying to complete self manual    Pertinent History  laryngectomy, current radiation tx    Patient Stated Goals  less swelling, to be able to swallow better.    Currently in Pain?  No/denies                       [facility] Adult PT Treatment/Exercise - 05/29/19 0001      Exercises   Exercises  Neck      Neck Exercises: Seated   Cervical Rotation   Both;5 reps    Lateral Flexion  Both;5 reps    Lateral Flexion Limitations  extension x 5       Neck Exercises: Supine   Cervical Rotation  Both;5 reps    Lateral Flexion  Both;5 reps      Manual Therapy   Manual Therapy  Joint mobilization;Soft tissue mobilization;Manual Lymphatic Drainage (MLD);Compression Bandaging    Manual therapy comments  completed seperate from all other aspects of treatment     Joint Mobilization  for improved ROM C2-C6 B     Soft tissue mobilization  for scar area with encouragement to pt to complete self massage to scar area.    Manual Lymphatic Drainage (MLD)  for thoracic/ cervical and facial                PT Short Term Goals - 05/25/19 1408      PT SHORT TERM GOAL #1   Title  Pt to be I in wearing her compression four hours a day to decrease fibrosis of skin.    Time  2    Period  Weeks  Status  Achieved    Target Date  05/12/19        PT Long Term Goals - 05/25/19 1408      PT LONG TERM GOAL #1   Title  PT to be I in self manual to decrease edema in cervical area.    Time  4    Period  Weeks    Status  Achieved      PT LONG TERM GOAL #2   Title  PT cervical rotation to improve 10 degrees both directions for safer driving    Time  4    Period  Weeks    Status  Achieved      PT LONG TERM GOAL #3   Title  PT measurement to have decreased by 1 cm to demonstrate decreased cervical edema.    Time  4    Period  Weeks    Status  Achieved      PT LONG TERM GOAL #4   Title  PT to decrease cervical volume by 3 cm    Time  2    Period  Weeks    Status  New    Target Date  06/08/19              Patient will benefit from skilled therapeutic intervention in order to improve the following deficits and impairments:     Visit Diagnosis: Lymphedema, not elsewhere classified  Stiffness of cervical spine     Problem List Patient Active Problem List   Diagnosis Date Noted  . Malignant neoplasm of glottis ([place])  04/06/2019  . Osteopenia 04/17/2016  . Sun-damaged skin 03/31/2016   [name], PT CLT [phone] 05/29/2019, 12:46 PM  [place] [address] [place], [place], [address] Phone: [phone]   Fax:  [phone]  Name: Melody Ward MRN: [id number] Date of Birth: 1950-09-10

## 2019-06-01 ENCOUNTER — Other Ambulatory Visit: Payer: Self-pay

## 2019-06-01 ENCOUNTER — Other Ambulatory Visit: Payer: Self-pay | Admitting: Radiation Oncology

## 2019-06-01 ENCOUNTER — Encounter (HOSPITAL_COMMUNITY): Payer: Medicare HMO | Admitting: Physical Therapy

## 2019-06-01 ENCOUNTER — Encounter: Payer: Self-pay | Admitting: Radiation Oncology

## 2019-06-01 ENCOUNTER — Inpatient Hospital Stay: Payer: Medicare HMO | Attending: Radiation Oncology | Admitting: Nutrition

## 2019-06-01 ENCOUNTER — Ambulatory Visit
Admission: RE | Admit: 2019-06-01 | Discharge: 2019-06-01 | Disposition: A | Discharge status: Home / Self Care | Payer: Medicare HMO | Source: Ambulatory Visit | Attending: Radiation Oncology | Admitting: Radiation Oncology

## 2019-06-01 ENCOUNTER — Encounter: Payer: Self-pay | Admitting: *Deleted

## 2019-06-01 DIAGNOSIS — C320 Malignant neoplasm of glottis: Secondary | ICD-10-CM

## 2019-06-01 NOTE — Progress Notes (Signed)
Nutrition follow-up completed with patient after radiation therapy.  Today was patient's last radiation treatment for her laryngeal cancer. Reports weight decreased to 106 pounds, down 2 more pounds this week. She reports drinking 3 bottles of Ensure Enlive a day. She is fatigued.  Nutrition diagnosis: Unintended weight loss continues.  Intervention: Provided support and encouragement for patient to continue strategies for increased calories and protein.   Increase Ensure Enlive 4 times daily. Provided second complementary case of Ensure Enlive. Provided name and phone number for patient to contact me for questions or additional Ensure.  Monitoring, evaluation, goals: Patient will work to increase calories and protein to promote healing and minimize weight loss.  Next visit: Patient will contact me for questions or concerns.  **Disclaimer: This note was dictated with voice recognition software. Similar sounding words can inadvertently be transcribed and this note may contain transcription errors which may not have been corrected upon publication of note.**

## 2019-06-02 ENCOUNTER — Encounter (HOSPITAL_COMMUNITY): Payer: Self-pay | Admitting: Physical Therapy

## 2019-06-02 ENCOUNTER — Ambulatory Visit (HOSPITAL_COMMUNITY): Payer: Medicare HMO | Admitting: Physical Therapy

## 2019-06-02 DIAGNOSIS — M436 Torticollis: Secondary | ICD-10-CM

## 2019-06-02 DIAGNOSIS — R1313 Dysphagia, pharyngeal phase: Secondary | ICD-10-CM | POA: Diagnosis not present

## 2019-06-02 DIAGNOSIS — I890 Lymphedema, not elsewhere classified: Secondary | ICD-10-CM

## 2019-06-02 NOTE — Therapy (Signed)
[place], [place], [address] Phone: [phone]   Fax:  [phone]  Physical Therapy Treatment  Patient Details  Name: Melody Ward MRN: [id number] Date of Birth: 01-17-1951 Referring Provider (PT): Eppie Gibson    Encounter Date: 06/02/2019  PT End of Session - 06/02/19 1147    Visit Number  9    Number of Visits  12    Date for PT Re-Evaluation  05/26/19    Authorization Type  [employer]na medicare    Authorization - Visit Number  9    Authorization - Number of Visits  10    PT Start Time  1050    PT Stop Time  1135    PT Time Calculation (min)  45 min    Activity Tolerance  Patient tolerated treatment well    Behavior During Therapy  [facility] for tasks assessed/performed       Past Medical History:  Diagnosis Date  . melanoma 1990    Past Surgical History:  Procedure Laterality Date  . CESAREAN SECTION    . FREE FLAP RADIAL FOREARM  03/06/2019   Dr. Nicolette Bang [facility]  . MODIFIED RADICAL NECK DISSECTION  03/06/2019  . NECK DISSECTION  03/06/2019   bilateral neck dissection  . NERVE GRAFT Left 03/06/2019   left arm lower, Dr. Nicolette Bang at [facility]  . PHARYNGECTOMY  03/06/2019   [facility]. [facility]ico[facility] at [facility]  . TOTAL LARYNGECTOMY  03/06/2019   Dr. Nicolette Bang at [facility]  . 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 Nicolette Bang [facility]  . WRIST SURGERY  06/06/2018   ORIF distal radius fracture    There were no vitals filed for this visit.  Subjective Assessment - 06/02/19 1146    Subjective  Pt states that she did not get much rest over the weekend as she kept coughing up mucous plugs.    Pertinent History  laryngectomy, current radiation tx    Patient Stated Goals  less swelling, to be able to swallow better.    Currently in Pain?  No/denies            LYMPHEDEMA/ONCOLOGY QUESTIONNAIRE - 06/02/19 1053      Lymphedema Assessments   Lymphedema Assessments  Head and Neck      Head  and Neck   Right Lateral Nostril at base of nose to medial tragus   8 cm    Left Lateral Nostril at base of nose to medial tragus   7.5 cm    Right Corner of mouth to where ear lobe meets face  7.2 cm    Left Corner of mouth to where ear lobe meets face  7.3 cm    4 cm superior to sternal notch around neck  30 cm    6 cm superior to sternal notch around neck  29.6 cm    8 cm superior to sternal notch around neck  31.5 cm                [facility] Adult PT Treatment/Exercise - 06/02/19 0001      Exercises   Exercises  Neck      Neck Exercises: Seated   Cervical Rotation  Both;5 reps    Lateral Flexion  Both;5 reps    Lateral Flexion Limitations  extension x 5       Neck Exercises: Supine   Cervical Rotation  Both;5 reps    Lateral Flexion  Both;5 reps  Manual Therapy   Manual Therapy  Joint mobilization;Soft tissue mobilization;Manual Lymphatic Drainage (MLD);Compression Bandaging    Manual therapy comments  completed seperate from all other aspects of treatment     Joint Mobilization  for improved ROM C2-C6 B     Soft tissue mobilization  for scar area with encouragement to pt to complete self massage to scar area.    Manual Lymphatic Drainage (MLD)  for thoracic/ cervical and facial                PT Short Term Goals - 05/25/19 1408      PT SHORT TERM GOAL #1   Title  Pt to be I in wearing her compression four hours a day to decrease fibrosis of skin.    Time  2    Period  Weeks    Status  Achieved    Target Date  05/12/19        PT Long Term Goals - 05/25/19 1408      PT LONG TERM GOAL #1   Title  PT to be I in self manual to decrease edema in cervical area.    Time  4    Period  Weeks    Status  Achieved      PT LONG TERM GOAL #2   Title  PT cervical rotation to improve 10 degrees both directions for safer driving    Time  4    Period  Weeks    Status  Achieved      PT LONG TERM GOAL #3   Title  PT measurement to have decreased by 1 cm to  demonstrate decreased cervical edema.    Time  4    Period  Weeks    Status  Achieved      PT LONG TERM GOAL #4   Title  PT to decrease cervical volume by 3 cm    Time  2    Period  Weeks    Status  New    Target Date  06/08/19            Plan - 06/02/19 1147    Clinical Impression Statement  Pt remeasured and continues to be reducing.  As long as pt is reducing we will continue to see her three times a week.    Examination-Activity Limitations  Other    Examination-Participation Restrictions  Other;Driving    Stability/Clinical Decision Making  Stable/Uncomplicated    Rehab Potential  Good    PT Frequency  3x / week    PT Duration  4 weeks    PT Treatment/Interventions  Manual techniques;Manual lymph drainage;Patient/family education    PT Next Visit Plan  Continue with scar massage as well as decongestive techniques.  Measure Cervical ROM and volumes weekly    PT Home Exercise Plan  manual and using compression       Patient will benefit from skilled therapeutic intervention in order to improve the following deficits and impairments:  Decreased skin integrity, Decreased range of motion, Increased edema  Visit Diagnosis: Lymphedema, not elsewhere classified  Stiffness of cervical spine     Problem List Patient Active Problem List   Diagnosis Date Noted  . 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 Osteopenia 04/17/2016  . Sun-damaged skin 03/31/2016    [name], PT CLT [phone] 06/02/2019, 11:49 AM  [place] [address] [place], [place], [address] Phone: [phone]   Fax:  [phone]  Name: Melody Ward MRN: [id number] Date of Birth: Apr 26, 1950

## 2019-06-03 ENCOUNTER — Other Ambulatory Visit: Payer: Self-pay

## 2019-06-03 ENCOUNTER — Encounter (HOSPITAL_COMMUNITY): Payer: Self-pay | Admitting: Speech Pathology

## 2019-06-03 ENCOUNTER — Ambulatory Visit (HOSPITAL_COMMUNITY): Payer: Medicare HMO | Admitting: Speech Pathology

## 2019-06-03 ENCOUNTER — Encounter (HOSPITAL_COMMUNITY): Payer: Self-pay | Admitting: Physical Therapy

## 2019-06-03 ENCOUNTER — Ambulatory Visit (HOSPITAL_COMMUNITY): Payer: Medicare HMO | Admitting: Physical Therapy

## 2019-06-03 DIAGNOSIS — M436 Torticollis: Secondary | ICD-10-CM

## 2019-06-03 DIAGNOSIS — I890 Lymphedema, not elsewhere classified: Secondary | ICD-10-CM | POA: Diagnosis not present

## 2019-06-03 DIAGNOSIS — R1313 Dysphagia, pharyngeal phase: Secondary | ICD-10-CM | POA: Diagnosis not present

## 2019-06-03 NOTE — Therapy (Signed)
[place], [place], [address] Phone: [phone]   Fax:  [phone]  Speech Language Pathology Treatment  Patient Details  Name: TONEA FINTON MRN: [id number] Date of Birth: October 27, 1950 Referring Provider (SLP): Eppie Gibson, MD   Encounter Date: 06/03/2019  End of Session - 06/03/19 1100    Visit Number  3    Number of Visits  13    Date for SLP Re-Evaluation  07/29/19   (90 days)   Authorization Type  [employer]na Medicare    SLP Start Time  [phone]    SLP Stop Time   1036    SLP Time Calculation (min)  44 min    Activity Tolerance  Patient tolerated treatment well       Past Medical History:  Diagnosis Date  . melanoma 1990    Past Surgical History:  Procedure Laterality Date  . CESAREAN SECTION    . FREE FLAP RADIAL FOREARM  03/06/2019   Dr. Nicolette Bang [facility]  . MODIFIED RADICAL NECK DISSECTION  03/06/2019  . NECK DISSECTION  03/06/2019   bilateral neck dissection  . NERVE GRAFT Left 03/06/2019   left arm lower, Dr. Nicolette Bang at [facility]  . PHARYNGECTOMY  03/06/2019   Dr. Nico[facility] Bang at [facility]  . TOTAL LARYNGECTOMY  03/06/2019   Dr. Nicolette Bang at [facility]  . 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 Nicolette Bang [facility]  . WRIST SURGERY  06/06/2018   ORIF distal radius fracture    There were no vitals filed for this visit.  Subjective Assessment - 06/03/19 1033    Subjective  "I have been bringing up mucous plugs."    Currently in Pain?  No/denies        ADULT SLP TREATMENT - 06/03/19 1054      General Information   Behavior/Cognition  Alert;Cooperative;Pleasant mood    Patient Positioning  Upright in chair    Oral care provided  N/A    HPI  developed hoarseness Jan '19, but not problematic until after URI in 06/2018. 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 Dr. 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 Large mass found on neck CT with nodal involvement on lt. [name] recommeded surgery - Dr. Nicolette Bang performed total laryngectomy on 03-03-19 with partial lt pharyngetomy, and total neck dissection on 03-06-19. Pt fitted with TEP and HME - intelligibility is 100% although pt exhibits consistent hydrophonia      Treatment Provided   Treatment provided  Dysphagia      Dysphagia Treatment   Temperature Spikes Noted  No    Respiratory Status  Room air    Oral Cavity - Dentition  Adequate natural dentition    Treatment Methods  Therapeutic exercise;Compensation strategy training;Skilled observation    Patient observed directly with PO's  Yes    Type of PO's observed  Dysphagia 1 (puree);Thin liquids    Feeding  Able to feed self    Liquids provided via  Cup    Pharyngeal Phase Signs & Symptoms  Multiple swallows;Wet vocal quality;Complaints of residue    Type of cueing  Verbal    Amount of cueing  Minimal      Pain Assessment   Pain Assessment  No/denies pain      Dysphagia Recommendations   Diet recommendations  Dysphagia 3 (mechanical soft);Thin liquid   add  moisture to foods   Liquids provided via  Cup      Progression Toward Goals   Progression toward goals  Progressing toward goals       SLP Education - 06/03/19 1059    Education Details  Continue all excercises, focus on increasing po intake, avoid talking during po intake, plan for MBSS in 2 weeks    Person(s) Educated  Patient    Methods  Explanation;Handout    Comprehension  Verbalized understanding       SLP Short Term Goals - 06/03/19 1101      SLP SHORT TERM GOAL #1   Title  Pt will complete HEP with rare min A over two sessions    Time  4    Period  --   sessions, for all STGs   Status  On-going      SLP SHORT TERM GOAL #2   Title  Pt will tell SLP rationale for HEP completion over two sessions    Time  4    Status  On-going      SLP SHORT TERM GOAL #3   Title  Pt will tell  SLP how a food journal can hasten/facilitate return to more normalized diet    Time  4    Status  On-going       SLP Long Term Goals - 06/03/19 1101      SLP LONG TERM GOAL #1   Title  Pt will complete HEP with modified independence over 3 visits    Time  5    Period  --   visits, for all LTGs   Status  On-going      SLP LONG TERM GOAL #2   Title  Pt will tell SLP when to decr frequency of HEP    Time  8    Status  On-going       Plan - 06/03/19 1101    Clinical Impression Statement Pt completed radiation therapy on Monday. She reports mild throat discomfort and increased mucous plugs. She has been successful in clearing mucous plugs by removing Larytube. Pt participated in therapeutic trials of thin and puree during our session. She required cues to refrain from speaking immediately post swallow; Pt with increased wetness to quality and reports of globus sensation. Pt with improved performance when she swallowed 2-3x per bite/sip. She reports that she has been completed swallowing exercises presented, but did not bring with her this session. Pt returned demonstrated with SLP cues. SLP reiterated importance of "eating to live" at this point in her recovery and she reports making increased efforts in increasing po intake. MBSS would be beneficial in ~2 weeks as Pt has only had esophagram a couple of months ago. She will go back to ENT in March. Will request order for MBSS to be completed the week of February 22. Pt to continue with swallowing exercises and increasing po intake. Pt in agreement with plan of care.    Speech Therapy Frequency  1x /week    Duration  --   x12 weeks, or 90 days   Treatment/Interventions  Aspiration precaution training;Pharyngeal strengthening exercises;Diet toleration management by SLP;Trials of upgraded texture/liquids;Internal/external aids;Patient/family education;Compensatory strategies;SLP instruction and feedback    Potential to Achieve Goals  Good    SLP  Home Exercise Plan  Continue HEP as assigned    Consulted and Agree with Plan of Care  Patient       Patient will benefit from skilled therapeutic intervention in order  to improve the following deficits and impairments:   Dysphagia, pharyngeal phase    Problem List Patient Active Problem List   Diagnosis Date Noted  . 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 Osteopenia 04/17/2016  . Sun-damaged skin 03/31/2016   Thank you,  [name], [place]   [facility] 06/03/2019, 11:02 AM  [place], [place], [address] Phone: [phone]   Fax:  [phone]   Name: BETTELOU SLOOP MRN: [id number] Date of Birth: 10-31-1950

## 2019-06-03 NOTE — Therapy (Signed)
[place], [place], [address] Phone: [phone]   Fax:  [phone]  Physical Therapy Treatment  Patient Details  Name: Melody Ward MRN: [id number] Date of Birth: 29-Sep-1950 Referring Provider (PT): Eppie Gibson   Encounter Date: 06/03/2019   Progress Note Reporting Period 04/28/2019  to 06/03/2019  See note below for Objective Data and Assessment of Progress/Goals.      PT End of Session - 06/03/19 1408    Visit Number  10    Number of Visits  19    Date for PT Re-Evaluation  05/26/19    Authorization Type  Aetna medicare    Authorization - Visit Number  1    Authorization - Number of Visits  9    PT Start Time  1100    PT Stop Time  1140    PT Time Calculation (min)  40 min    Activity Tolerance  Patient tolerated treatment well    Behavior During Therapy  WFL for tasks assessed/performed       Past Medical History:  Diagnosis Date  . melanoma 1990    Past Surgical History:  Procedure Laterality Date  . CESAREAN SECTION    . FREE FLAP RADIAL FOREARM  03/06/2019   Dr. Nicolette Bang [facility]  . MODIFIED RADICAL NECK DISSECTION  03/06/2019  . NECK DISSECTION  03/06/2019   bilateral neck dissection  . NERVE GRAFT Left 03/06/2019   left arm lower, Dr. Nicolette Bang at [facility]  . PHARYNGECTOMY  03/06/2019   Dr[facility] Nico[facility] Bang at [facility]  . TOTAL LARYNGECTOMY  03/06/2019   Dr. Nicolette Bang at [facility]  . 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 Nicolette Bang [facility]  . WRIST SURGERY  06/06/2018   ORIF distal radius fracture    There were no vitals filed for this visit.  Subjective Assessment - 06/03/19 1403    Subjective  PT states that she is attempting to complete her manual one time a day.    Pertinent History  laryngectomy, current radiation tx    Patient Stated Goals  less swelling, to be able to swallow better.    Currently in Pain?  No/denies         [facility] PT Assessment -  06/03/19 0001      Assessment   Medical Diagnosis  cervical lymphedema     Referring Provider (PT)  Eppie Gibson     Onset Date/Surgical Date  03/06/19    Prior Therapy  none      Prior Function   Level of Independence  Independent      Cognition   Overall Cognitive Status  Within Functional Limits for tasks assessed      AROM   Cervical Flexion  45   was 35    Cervical Extension  48   was 40    Cervical - Right Side Bend  25   was 20   Cervical - Left Side Bend  30   was 20    Cervical - Right Rotation  60   was 48   Cervical - Left Rotation  55   45     Palpation   Palpation comment  noted increased fibrosis, decreased scar movement         LYMPHEDEMA/ONCOLOGY QUESTIONNAIRE - 06/03/19 1404      Surgeries   Other Surgery Date  03/05/20    Number Lymph Nodes Removed  47      Date Lymphedema/Swelling  Started   Date  03/07/19      Treatment   Active Radiation Treatment  Yes      What other symptoms do you have   Are you Having Heaviness or Tightness  Yes    Are you having Pain  No    Are you having pitting edema  No    Is it Hard or Difficult finding clothes that fit  No    Do you have infections  --   past not now    Is there Decreased scar mobility  Yes      Lymphedema Assessments   Lymphedema Assessments  Head and Neck      Head and Neck   Right Lateral Nostril at base of nose to medial tragus   8 cm   was 9.2   Left Lateral Nostril at base of nose to medial tragus   7.5 cm   was 9.1   Right Corner of mouth to where ear lobe meets face  7.2 cm   was 8.6   Left Corner of mouth to where ear lobe meets face  7.3 cm   was 8.3   4 cm superior to sternal notch around neck  30 cm   was 30.8   6 cm superior to sternal notch around neck  29.6 cm   was 32.8    8 cm superior to sternal notch around neck  31.5 cm                [facility] Adult PT Treatment/Exercise - 06/03/19 0001      Exercises   Exercises  Neck      Neck Exercises: Seated    Cervical Rotation  Both;5 reps    Lateral Flexion  Both;5 reps    Lateral Flexion Limitations  extension x 5       Neck Exercises: Supine   Cervical Rotation  Both;5 reps    Lateral Flexion  Both;5 reps      Manual Therapy   Manual Therapy  Joint mobilization;Soft tissue mobilization;Manual Lymphatic Drainage (MLD);Compression Bandaging    Manual therapy comments  completed seperate from all other aspects of treatment     Joint Mobilization  for improved ROM C2-C6 B     Soft tissue mobilization  for scar area with encouragement to pt to complete self massage to scar area.    Manual Lymphatic Drainage (MLD)  for thoracic/ cervical and facial                PT Short Term Goals - 05/25/19 1408      PT SHORT TERM GOAL #1   Title  Pt to be I in wearing her compression four hours a day to decrease fibrosis of skin.    Time  2    Period  Weeks    Status  Achieved    Target Date  05/12/19        PT Long Term Goals - 06/03/19 1425      PT LONG TERM GOAL #1   Title  PT to be I in self manual to decrease edema in cervical area.    Time  4    Period  Weeks    Status  Achieved      PT LONG TERM GOAL #2   Title  PT cervical rotation to improve 10 degrees both directions for safer driving    Time  4    Period  Weeks    Status  Achieved  PT LONG TERM GOAL #3   Title  PT measurement to have decreased by 1 cm to demonstrate decreased cervical edema.    Time  4    Period  Weeks    Status  Achieved      PT LONG TERM GOAL #4   Title  PT to decrease cervical volume by 2 cm    Time  3    Period  Weeks    Status  New    Target Date  06/24/19      PT LONG TERM GOAL #5   Title  PT to be able to rotate cervical spine 65 degrees for improved safety in driving.    Time  3    Period  Weeks    Status  New    Target Date  06/24/19            Plan - 06/03/19 1417    Clinical Impression Statement  PT has improved cervical ROM but is not withing normal limits at this  time.  PT cervical volume has reduced at all aspects.  Scar tissue continues to limit the ability of lymphatic circulation although manual to the scar area is improving slightly.  Patient will continue to benefit from skilled PT in order to maximize cervical ROM as well as to reduce volume to the lowest possible level in order to improve body image as well as improve pt ability to swallow.    Examination-Activity Limitations  Other    Examination-Participation Restrictions  Other;Driving    Stability/Clinical Decision Making  Stable/Uncomplicated    Rehab Potential  Good    PT Frequency  3x / week    PT Duration  4 weeks    PT Treatment/Interventions  Manual techniques;Manual lymph drainage;Patient/family education    PT Next Visit Plan  Continue with scar massage as well as decongestive techniques for three more weeks to achieve optimal results.  Measure Cervical ROM and volumes weekly    PT Home Exercise Plan  manual and using compression       Patient will benefit from skilled therapeutic intervention in order to improve the following deficits and impairments:  Decreased skin integrity, Decreased range of motion, Increased edema  Visit Diagnosis: Lymphedema, not elsewhere classified  Stiffness of cervical spine     Problem List Patient Active Problem List   Diagnosis Date Noted  . 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 Osteopenia 04/17/2016  . Sun-damaged skin 03/31/2016    [name], PT CLT [phone] 06/03/2019, 2:29 PM  [place] [address] [place], [place], [address] Phone: [phone]   Fax:  [phone]  Name: Melody Ward MRN: [id number] Date of Birth: July 13, 1950

## 2019-06-05 ENCOUNTER — Other Ambulatory Visit: Payer: Self-pay

## 2019-06-05 ENCOUNTER — Ambulatory Visit (HOSPITAL_COMMUNITY): Payer: Medicare HMO | Admitting: Physical Therapy

## 2019-06-05 DIAGNOSIS — I890 Lymphedema, not elsewhere classified: Secondary | ICD-10-CM | POA: Diagnosis not present

## 2019-06-05 DIAGNOSIS — R1313 Dysphagia, pharyngeal phase: Secondary | ICD-10-CM | POA: Diagnosis not present

## 2019-06-05 DIAGNOSIS — M436 Torticollis: Secondary | ICD-10-CM | POA: Diagnosis not present

## 2019-06-05 NOTE — Therapy (Signed)
[place], [place], [address] Phone: [phone]   Fax:  [phone]  Physical Therapy Treatment  Patient Details  Name: DOSIA LEINWEBER MRN: [id number] Date of Birth: 06-29-1950 Referring Provider (PT): Eppie Gibson    Encounter Date: 06/05/2019  PT End of Session - 06/05/19 1225    Visit Number  11    Number of Visits  19    Date for PT Re-Evaluation  05/26/19    Authorization Type  [employer]na medicare    Authorization - Visit Number  2    Authorization - Number of Visits  9    PT Start Time  [id number]    PT Stop Time  1135    PT Time Calculation (min)  43 min    Activity Tolerance  Patient tolerated treatment well    Behavior During Therapy  [facility] for tasks assessed/performed       Past Medical History:  Diagnosis Date  . melanoma 1990    Past Surgical History:  Procedure Laterality Date  . CESAREAN SECTION    . FREE FLAP RADIAL FOREARM  03/06/2019   [facility]  . MODIFIED RADICAL NECK DISSECTION  03/06/2019  . NECK DISSECTION  03/06/2019   bilateral neck dissection  . NERVE GRAFT Left 03/06/2019   left arm lower, Dr. Nicolette Bang at [facility]  . PHARYNGECTOMY  03/06/2019   Dr. Nico[facility] Bang at [facility]  . TOTAL LARYNGECTOMY  03/06/2019   Dr. Nicolette Bang at [facility]  . 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 Nicolette Bang [facility]  . WRIST SURGERY  06/06/2018   ORIF distal radius fracture    There were no vitals filed for this visit.  Subjective Assessment - 06/05/19 1224    Subjective  Pt can tell that therapy is helping    Pertinent History  laryngectomy, current radiation tx    Patient Stated Goals  less swelling, to be able to swallow better.    Currently in Pain?  No/denies           [facility] Adult PT Treatment/Exercise - 06/05/19 0001      Exercises   Exercises  Neck      Neck Exercises: Seated   Cervical Rotation  Both;5 reps    Lateral Flexion  Both;5 reps    Lateral  Flexion Limitations  extension x 5       Neck Exercises: Supine   Cervical Rotation  Both;5 reps    Lateral Flexion  Both;5 reps      Manual Therapy   Manual Therapy  Joint mobilization;Soft tissue mobilization;Manual Lymphatic Drainage (MLD);Compression Bandaging    Manual therapy comments  completed seperate from all other aspects of treatment     Joint Mobilization  for improved ROM C2-C6 B     Soft tissue mobilization  for scar area with encouragement to pt to complete self massage to scar area.    Manual Lymphatic Drainage (MLD)  for thoracic/ cervical and facial                PT Short Term Goals - 05/25/19 1408      PT SHORT TERM GOAL #1   Title  Pt to be I in wearing her compression four hours a day to decrease fibrosis of skin.    Time  2    Period  Weeks    Status  Achieved    Target Date  05/12/19  PT Long Term Goals - 06/03/19 1425      PT LONG TERM GOAL #1   Title  PT to be I in self manual to decrease edema in cervical area.    Time  4    Period  Weeks    Status  Achieved      PT LONG TERM GOAL #2   Title  PT cervical rotation to improve 10 degrees both directions for safer driving    Time  4    Period  Weeks    Status  Achieved      PT LONG TERM GOAL #3   Title  PT measurement to have decreased by 1 cm to demonstrate decreased cervical edema.    Time  4    Period  Weeks    Status  Achieved      PT LONG TERM GOAL #4   Title  PT to decrease cervical volume by 2 cm    Time  3    Period  Weeks    Status  New    Target Date  06/24/19      PT LONG TERM GOAL #5   Title  PT to be able to rotate cervical spine 65 degrees for improved safety in driving.    Time  3    Period  Weeks    Status  New    Target Date  06/24/19            Plan - 06/05/19 1226    Clinical Impression Statement  Treatment focused on decreasing scar tissue as this is blocking lymphatic circulation.  Manual techniques to improve lymphatic circulation also  completed.  Rt rotation in near normal now but Lt remains limited.  PROM to imrove range.    Examination-Activity Limitations  Other    Examination-Participation Restrictions  Other;Driving    Stability/Clinical Decision Making  Stable/Uncomplicated    Rehab Potential  Good    PT Frequency  3x / week    PT Duration  4 weeks    PT Treatment/Interventions  Manual techniques;Manual lymph drainage;Patient/family education    PT Next Visit Plan  Continue with scar massage as well as decongestive techniques for three more weeks to achieve optimal results.  Measure Cervical ROM and volumes weekly    PT Home Exercise Plan  manual and using compression       Patient will benefit from skilled therapeutic intervention in order to improve the following deficits and impairments:  Decreased skin integrity, Decreased range of motion, Increased edema  Visit Diagnosis: Lymphedema, not elsewhere classified  Stiffness of cervical spine     Problem List Patient Active Problem List   Diagnosis Date Noted  . 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 Osteopenia 04/17/2016  . Sun-damaged skin 03/31/2016   [name], PT CLT [phone] 06/05/2019, 12:28 PM  [place] [address] [place], [place], [address] Phone: [phone]   Fax:  [phone]  Name: PATIENCE HUMERICKHOUSE MRN: [id number] Date of Birth: May 09, 1950

## 2019-06-05 NOTE — Progress Notes (Signed)
Oncology Nurse Navigator Documentation  Met with Melody Ward prior to final RT to offer support and to celebrate end of radiation treatment.   Provided verbal/written post-RT guidance:  Importance of keeping all follow-up appts, especially those with Nutrition and SLP.  Importance of protecting treatment area from sun.  Continuation of Sonafine application 2-3 times daily, application of abx ointment to areas of raw skin; when supply of Sonafine exhausted transition to OTC lotion with vitamin E. Explained my role as navigator will continue for several more months, I will be calling or joining her during follow-up visits.   She agreed to call me with needs/concerns.    Gayleen Orem, RN, BSN Head & Neck Oncology [place] [facility] [place] [phone]

## 2019-06-08 ENCOUNTER — Encounter (HOSPITAL_COMMUNITY): Payer: Self-pay | Admitting: Physical Therapy

## 2019-06-08 ENCOUNTER — Other Ambulatory Visit: Payer: Self-pay

## 2019-06-08 ENCOUNTER — Other Ambulatory Visit: Payer: Self-pay | Admitting: Radiation Oncology

## 2019-06-08 ENCOUNTER — Ambulatory Visit (HOSPITAL_COMMUNITY): Payer: Medicare HMO | Admitting: Physical Therapy

## 2019-06-08 DIAGNOSIS — Z430 Encounter for attention to tracheostomy: Secondary | ICD-10-CM | POA: Diagnosis not present

## 2019-06-08 DIAGNOSIS — I890 Lymphedema, not elsewhere classified: Secondary | ICD-10-CM

## 2019-06-08 DIAGNOSIS — M436 Torticollis: Secondary | ICD-10-CM

## 2019-06-08 DIAGNOSIS — C320 Malignant neoplasm of glottis: Secondary | ICD-10-CM

## 2019-06-08 DIAGNOSIS — R1313 Dysphagia, pharyngeal phase: Secondary | ICD-10-CM | POA: Diagnosis not present

## 2019-06-08 NOTE — Therapy (Signed)
Mel[place] Ward, [place], [address] Phone: [phone]   Fax:  [phone]  Physical Therapy Treatment  Patient Details  Name: [name] MRN: [id number] Date of Birth: [birth year] Referring Provider (PT): Eppie Gibson    Encounter Date: 06/08/2019  PT End of Session - 06/08/19 1451    Visit Number  12    Number of Visits  19    Date for PT Re-Evaluation  05/26/19    Authorization Type  [employer]na medicare    Authorization - Visit Number  3    Authorization - Number of Visits  9    PT Start Time  [id number]    PT Stop Time  1445    PT Time Calculation (min)  40 min    Activity Tolerance  Patient tolerated treatment well    Behavior During Therapy  [facility] for tasks assessed/performed       Past Medical History:  Diagnosis Date  . melanoma 1990    Past Surgical History:  Procedure Laterality Date  . CESAREAN SECTION    . FREE FLAP RADIAL FOREARM  03/06/2019   Dr. Nicolette Bang [facility]  . MODIFIED RADICAL NECK DISSECTION  03/06/2019  . NECK DISSECTION  03/06/2019   bilateral neck dissection  . NERVE GRAFT Left 03/06/2019   left arm lower, [facility]  . PHARYNGECTOMY  03/06/2019   [facility] at [facility]  . TOTAL LARYNGECTOMY  03/06/2019   [facility] at [facility]  . 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 Nicolette Bang [facility]  . WRIST SURGERY  06/06/2018   ORIF distal radius fracture    There were no vitals filed for this visit.  Subjective Assessment - 06/08/19 1450    Subjective  Pt states that she got to complete quite a bit of manual on her neck this weekend as she was out of power.    Pertinent History  laryngectomy, current radiation tx    Patient Stated Goals  less swelling, to be able to swallow better.                       [facility] Adult PT Treatment/Exercise - 06/08/19 0001      Exercises   Exercises  Neck      Neck Exercises: Seated   Cervical  Rotation  Both;5 reps    Lateral Flexion  Both;5 reps    Lateral Flexion Limitations  extension x 5       Neck Exercises: Supine   Cervical Rotation  Both;5 reps    Lateral Flexion  Both;5 reps      Manual Therapy   Manual Therapy  Joint mobilization;Soft tissue mobilization;Manual Lymphatic Drainage (MLD);Compression Bandaging    Manual therapy comments  completed seperate from all other aspects of treatment     Joint Mobilization  for improved ROM C2-C6 B     Soft tissue mobilization  for scar area with encouragement to pt to complete self massage to scar area.    Manual Lymphatic Drainage (MLD)  for thoracic/ cervical and facial                PT Short Term Goals - 05/25/19 1408      PT SHORT TERM GOAL #1   Title  Pt to be I in wearing her compression four hours a day to decrease fibrosis of skin.    Time  2    Period  Weeks  Status  Achieved    Target Date  05/12/19        PT Long Term Goals - 06/03/19 1425      PT LONG TERM GOAL #1   Title  PT to be I in self manual to decrease edema in cervical area.    Time  4    Period  Weeks    Status  Achieved      PT LONG TERM GOAL #2   Title  PT cervical rotation to improve 10 degrees both directions for safer driving    Time  4    Period  Weeks    Status  Achieved      PT LONG TERM GOAL #3   Title  PT measurement to have decreased by 1 cm to demonstrate decreased cervical edema.    Time  4    Period  Weeks    Status  Achieved      PT LONG TERM GOAL #4   Title  PT to decrease cervical volume by 2 cm    Time  3    Period  Weeks    Status  New    Target Date  06/24/19      PT LONG TERM GOAL #5   Title  PT to be able to rotate cervical spine 65 degrees for improved safety in driving.    Time  3    Period  Weeks    Status  New    Target Date  06/24/19            Plan - 06/08/19 1452    Clinical Impression Statement  Focus continues to be to decrease adhesions, unfortunately if therapist  stretches too far it irritates pt's articial voice box and sets off a coughing fit.  Pt induration continues to respond adequately to manual but is limited by adhesions    Examination-Activity Limitations  Other    Examination-Participation Restrictions  Other;Driving    Stability/Clinical Decision Making  Stable/Uncomplicated    Rehab Potential  Good    PT Frequency  3x / week    PT Duration  4 weeks    PT Treatment/Interventions  Manual techniques;Manual lymph drainage;Patient/family education    PT Next Visit Plan  Continue with scar massage as well as decongestive techniques for three more weeks to achieve optimal results.  Measure Cervical ROM and volumes weekly    PT Home Exercise Plan  manual and using compression       Patient will benefit from skilled therapeutic intervention in order to improve the following deficits and impairments:  Decreased skin integrity, Decreased range of motion, Increased edema  Visit Diagnosis: Lymphedema, not elsewhere classified  Stiffness of cervical spine     Problem List Patient Active Problem List   Diagnosis Date Noted  . 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 Osteopenia 04/17/2016  . Sun-damaged skin 03/31/2016    [name], PT CLT [phone] 06/08/2019, 2:54 PM  [place] [address] [place], [place], [address] Phone: [phone]   Fax:  [phone]  Name: Melody Ward MRN: [id number] Date of Birth: October 07, 1950

## 2019-06-09 ENCOUNTER — Other Ambulatory Visit (HOSPITAL_COMMUNITY): Payer: Self-pay | Admitting: Specialist

## 2019-06-09 DIAGNOSIS — R1312 Dysphagia, oropharyngeal phase: Secondary | ICD-10-CM

## 2019-06-09 DIAGNOSIS — R490 Dysphonia: Secondary | ICD-10-CM | POA: Diagnosis not present

## 2019-06-10 ENCOUNTER — Encounter (HOSPITAL_COMMUNITY): Payer: Self-pay | Admitting: Physical Therapy

## 2019-06-10 ENCOUNTER — Other Ambulatory Visit: Payer: Self-pay

## 2019-06-10 ENCOUNTER — Ambulatory Visit (HOSPITAL_COMMUNITY): Payer: Medicare HMO | Admitting: Physical Therapy

## 2019-06-10 DIAGNOSIS — I890 Lymphedema, not elsewhere classified: Secondary | ICD-10-CM | POA: Diagnosis not present

## 2019-06-10 DIAGNOSIS — M436 Torticollis: Secondary | ICD-10-CM | POA: Diagnosis not present

## 2019-06-10 DIAGNOSIS — R1313 Dysphagia, pharyngeal phase: Secondary | ICD-10-CM | POA: Diagnosis not present

## 2019-06-10 NOTE — Therapy (Signed)
[place] [address] [place], [place], [address] Phone: [phone]   Fax:  [phone]  Physical Therapy Treatment  Patient Details  Name: Melody Ward MRN: [id number] Date of Birth: 1950-05-17 Referring Provider (PT): Eppie Gibson    Encounter Date: 06/10/2019  PT End of Session - 06/10/19 1422    Visit Number  13    Number of Visits  19    Date for PT Re-Evaluation  05/26/19    Authorization Type  Aetna medicare    Authorization - Visit Number  4    Authorization - Number of Visits  9    PT Start Time  1522    PT Stop Time  1609    PT Time Calculation (min)  47 min    Activity Tolerance  Patient tolerated treatment well    Behavior During Therapy  [facility] for tasks assessed/performed       Past Medical History:  Diagnosis Date  . melanoma 1990    Past Surgical History:  Procedure Laterality Date  . CESAREAN SECTION    . FREE FLAP RADIAL FOREARM  03/06/2019   Dr. [facility]icolette Bang [facility]  . MODIFIED RADICAL NECK DISSECTION  03/06/2019  . NECK DISSECTION  03/06/2019   bilateral neck dissection  . NERVE GRAFT Left 03/06/2019   left arm lower, [facility] at [facility]  . PHARYNGECTOMY  03/06/2019   [facility]  . TOTAL LARYNGECTOMY  03/06/2019   Dr. [facility] Bang at [facility]  . 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 Nicolette Bang [facility]  . WRIST SURGERY  06/06/2018   ORIF distal radius fracture    There were no vitals filed for this visit.  Subjective Assessment - 06/10/19 1420    Subjective  PT has no complaints.  She is having a swallow test as it seems things are getting stuck in her throat.    Pertinent History  laryngectomy, current radiation tx    Patient Stated Goals  less swelling, to be able to swallow better.    Currently in Pain?  No/denies         [facility] PT Assessment - 06/10/19 0001      AROM   Cervical Flexion  45   was 35    Cervical Extension  55    Cervical - Right  Side Bend  28 was 25    Cervical - Left Side Bend  30 was 30   Cervical - Right Rotation  65 was 60   Cervical - Left Rotation  60 was 55       LYMPHEDEMA/ONCOLOGY QUESTIONNAIRE - 06/10/19 1330      Surgeries   Other Surgery Date  03/05/20    Number Lymph Nodes Removed  26      Date Lymphedema/Swelling Started   Date  03/07/19      Treatment   Active Radiation Treatment  Yes      What other symptoms do you have   Are you Having Heaviness or Tightness  Yes    Are you having Pain  No    Are you having pitting edema  No    Is it Hard or Difficult finding clothes that fit  No    Do you have infections  --   past not now    Is there Decreased scar mobility  Yes      Lymphedema Assessments   Lymphedema Assessments  Head and Neck  Head and Neck   Right Lateral Nostril at base of nose to medial tragus   7.5 cm   was 9.2   Left Lateral Nostril at base of nose to medial tragus   7.5 cm   was 9.1   Right Corner of mouth to where ear lobe meets face  7 cm   was 8.6   Left Corner of mouth to where ear lobe meets face  6.8 cm   was 8.3   4 cm superior to sternal notch around neck  29.5 cm   was 30.8   6 cm superior to sternal notch around neck  29 cm   was 32.8    8 cm superior to sternal notch around neck  30.4 cm                [facility] Adult PT Treatment/Exercise - 06/10/19 0001      Exercises   Exercises  Neck      Neck Exercises: Seated   Cervical Rotation  Both;5 reps    Lateral Flexion  Both;5 reps    Lateral Flexion Limitations  extension x 5       Neck Exercises: Supine   Cervical Rotation  Both;5 reps    Lateral Flexion  Both;5 reps      Manual Therapy   Manual Therapy  Joint mobilization;Soft tissue mobilization;Manual Lymphatic Drainage (MLD);Compression Bandaging    Manual therapy comments  completed seperate from all other aspects of treatment     Joint Mobilization  for improved ROM C2-C6 B     Soft tissue mobilization  for scar area with  encouragement to pt to complete self massage to scar area.    Manual Lymphatic Drainage (MLD)  for thoracic/ cervical and facial                PT Short Term Goals - 05/25/19 1408      PT SHORT TERM GOAL #1   Title  Pt to be I in wearing her compression four hours a day to decrease fibrosis of skin.    Time  2    Period  Weeks    Status  Achieved    Target Date  05/12/19        PT Long Term Goals - 06/03/19 1425      PT LONG TERM GOAL #1   Title  PT to be I in self manual to decrease edema in cervical area.    Time  4    Period  Weeks    Status  Achieved      PT LONG TERM GOAL #2   Title  PT cervical rotation to improve 10 degrees both directions for safer driving    Time  4    Period  Weeks    Status  Achieved      PT LONG TERM GOAL #3   Title  PT measurement to have decreased by 1 cm to demonstrate decreased cervical edema.    Time  4    Period  Weeks    Status  Achieved      PT LONG TERM GOAL #4   Title  PT to decrease cervical volume by 2 cm    Time  3    Period  Weeks    Status  New    Target Date  06/24/19      PT LONG TERM GOAL #5   Title  PT to be able to rotate cervical spine 65 degrees for  improved safety in driving.    Time  3    Period  Weeks    Status  New    Target Date  06/24/19            Plan - 06/10/19 1422    Clinical Impression Statement  Pt remeasured today with improvement in volume and cervical ROM.  Noted devitalized tissue over scar area therefore therapist removed tissue using a sterile forcep. Pt continues to improve therefore she will continue to benefit from skilled PT to decrease volumes to restore self image as well as assist in ease of swallowing.    Examination-Activity Limitations  Other    Examination-Participation Restrictions  Other;Driving    Stability/Clinical Decision Making  Stable/Uncomplicated    Rehab Potential  Good    PT Frequency  3x / week    PT Duration  4 weeks    PT Treatment/Interventions   Manual techniques;Manual lymph drainage;Patient/family education    PT Next Visit Plan  Continues with scar massage as well as manual lymphatic decongestive techniques.    PT Home Exercise Plan  manual and using compression       Patient will benefit from skilled therapeutic intervention in order to improve the following deficits and impairments:  Decreased skin integrity, Decreased range of motion, Increased edema  Visit Diagnosis: Lymphedema, not elsewhere classified  Stiffness of cervical spine     Problem List Patient Active Problem List   Diagnosis Date Noted  . 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 Osteopenia 04/17/2016  . Sun-damaged skin 03/31/2016     [name], PT CLT [phone] 06/10/2019, 2:27 PM  [place] [address] [place], [place], [address] Phone: [phone]   Fax:  [phone]  Name: Melody Ward MRN: [id number] Date of Birth: December 04, 1950

## 2019-06-12 ENCOUNTER — Encounter (HOSPITAL_COMMUNITY): Payer: Self-pay | Admitting: Physical Therapy

## 2019-06-12 ENCOUNTER — Ambulatory Visit (HOSPITAL_COMMUNITY): Payer: Medicare HMO | Admitting: Physical Therapy

## 2019-06-12 ENCOUNTER — Other Ambulatory Visit: Payer: Self-pay

## 2019-06-12 DIAGNOSIS — Z930 Tracheostomy status: Secondary | ICD-10-CM | POA: Diagnosis not present

## 2019-06-12 DIAGNOSIS — I890 Lymphedema, not elsewhere classified: Secondary | ICD-10-CM | POA: Diagnosis not present

## 2019-06-12 DIAGNOSIS — J3801 Paralysis of vocal cords and larynx, unilateral: Secondary | ICD-10-CM | POA: Diagnosis not present

## 2019-06-12 DIAGNOSIS — C801 Malignant (primary) neoplasm, unspecified: Secondary | ICD-10-CM | POA: Diagnosis not present

## 2019-06-12 DIAGNOSIS — M436 Torticollis: Secondary | ICD-10-CM

## 2019-06-12 DIAGNOSIS — R1313 Dysphagia, pharyngeal phase: Secondary | ICD-10-CM | POA: Diagnosis not present

## 2019-06-12 DIAGNOSIS — J383 Other diseases of vocal cords: Secondary | ICD-10-CM | POA: Diagnosis not present

## 2019-06-12 DIAGNOSIS — R490 Dysphonia: Secondary | ICD-10-CM | POA: Diagnosis not present

## 2019-06-12 NOTE — Therapy (Signed)
[place], [place], [address] Phone: [phone]   Fax:  [phone]  Physical Therapy Treatment  Patient Details  Name: Melody Ward MRN: [id number] Date of Birth: Oct 08, 1950 Referring Provider (PT): Eppie Gibson    Encounter Date: 06/12/2019  PT End of Session - 06/12/19 1429    Visit Number  14    Number of Visits  19    Date for PT Re-Evaluation  05/26/19    Authorization Type  A[employer]na medicare    Authorization - Visit Number  5    Authorization - Number of Visits  9    PT Start Time  1050    PT Stop Time  1130    PT Time Calculation (min)  40 min    Activity Tolerance  Patient tolerated treatment well    Behavior During Therapy  [facility] for tasks assessed/performed       Past Medical History:  Diagnosis Date  . melanoma 1990    Past Surgical History:  Procedure Laterality Date  . CESAREAN SECTION    . FREE FLAP RADIAL FOREARM  03/06/2019   Dr. Nicolette Bang [facility]  . MODIFIED RADICAL NECK DISSECTION  03/06/2019  . NECK DISSECTION  03/06/2019   bilateral neck dissection  . NERVE GRAFT Left 03/06/2019   left arm lower, Dr[facility] at [facility]  . PHARYNGECTOMY  03/06/2019   Dr[facility]ico[facility] at [facility]  . TOTAL LARYNGECTOMY  03/06/2019   Dr. Nicolette Bang at [facility]  . 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 Nicolette Bang [facility]  . WRIST SURGERY  06/06/2018   ORIF distal radius fracture    There were no vitals filed for this visit.  Subjective Assessment - 06/12/19 1426    Subjective  PT has no complaints    Pertinent History  laryngectomy, current radiation tx    Patient Stated Goals  less swelling, to be able to swallow better.    Currently in Pain?  No/denies                       [facility] Adult PT Treatment/Exercise - 06/12/19 0001      Exercises   Exercises  Neck      Neck Exercises: Seated   Cervical Rotation  Both;5 reps    Lateral Flexion  Both;5 reps    Lateral Flexion Limitations  extension x 5       Neck Exercises: Supine   Cervical Rotation  Both;5 reps    Lateral Flexion  Both;5 reps      Manual Therapy   Manual Therapy  Joint mobilization;Soft tissue mobilization;Manual Lymphatic Drainage (MLD);Compression Bandaging    Manual therapy comments  completed seperate from all other aspects of treatment     Joint Mobilization  for improved ROM C2-C6 B     Soft tissue mobilization  for scar area with encouragement to pt to complete self massage to scar area.    Manual Lymphatic Drainage (MLD)  for thoracic/ cervical and facial                PT Short Term Goals - 05/25/19 1408      PT SHORT TERM GOAL #1   Title  Pt to be I in wearing her compression four hours a day to decrease fibrosis of skin.    Time  2    Period  Weeks    Status  Achieved    Target Date  05/12/19        PT Long Term Goals - 06/03/19 1425      PT LONG TERM GOAL #1   Title  PT to be I in self manual to decrease edema in cervical area.    Time  4    Period  Weeks    Status  Achieved      PT LONG TERM GOAL #2   Title  PT cervical rotation to improve 10 degrees both directions for safer driving    Time  4    Period  Weeks    Status  Achieved      PT LONG TERM GOAL #3   Title  PT measurement to have decreased by 1 cm to demonstrate decreased cervical edema.    Time  4    Period  Weeks    Status  Achieved      PT LONG TERM GOAL #4   Title  PT to decrease cervical volume by 2 cm    Time  3    Period  Weeks    Status  New    Target Date  06/24/19      PT LONG TERM GOAL #5   Title  PT to be able to rotate cervical spine 65 degrees for improved safety in driving.    Time  3    Period  Weeks    Status  New    Target Date  06/24/19            Plan - 06/12/19 1430    Clinical Impression Statement  Therapist spent a significant time working on adhesive scarring to allow improved lymphatic flow.    Examination-Activity Limitations   Other    Examination-Participation Restrictions  Other;Driving    Stability/Clinical Decision Making  Stable/Uncomplicated    Rehab Potential  Good    PT Frequency  3x / week    PT Duration  4 weeks    PT Treatment/Interventions  Manual techniques;Manual lymph drainage;Patient/family education    PT Next Visit Plan  Continues with scar massage as well as manual lymphatic decongestive techniques.    PT Home Exercise Plan  manual and using compression       Patient will benefit from skilled therapeutic intervention in order to improve the following deficits and impairments:  Decreased skin integrity, Decreased range of motion, Increased edema  Visit Diagnosis: Lymphedema, not elsewhere classified  Stiffness of cervical spine     Problem List Patient Active Problem List   Diagnosis Date Noted  . 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 Osteopenia 04/17/2016  . Sun-damaged skin 03/31/2016    [name], PT CLT [phone] 06/12/2019, 2:31 PM  [place] [address] [place], [place], [address] Phone: [phone]   Fax:  [phone]  Name: Melody Ward MRN: [id number] Date of Birth: 05/03/1950

## 2019-06-16 ENCOUNTER — Ambulatory Visit (HOSPITAL_COMMUNITY): Payer: Medicare HMO | Admitting: Physical Therapy

## 2019-06-16 ENCOUNTER — Other Ambulatory Visit: Payer: Self-pay

## 2019-06-16 DIAGNOSIS — I890 Lymphedema, not elsewhere classified: Secondary | ICD-10-CM | POA: Diagnosis not present

## 2019-06-16 DIAGNOSIS — M436 Torticollis: Secondary | ICD-10-CM | POA: Diagnosis not present

## 2019-06-16 DIAGNOSIS — R1313 Dysphagia, pharyngeal phase: Secondary | ICD-10-CM | POA: Diagnosis not present

## 2019-06-16 NOTE — Therapy (Signed)
Melody Melody Ward, [place], [address] Phone: [phone]   Fax:  [phone]  Physical Therapy Treatment  Patient Details  Name: Melody Melody Ward MRN: [id number] Date of Birth: 08/25/1950 Referring Provider (PT): Melody Melody Ward    Encounter Date: 06/16/2019  PT End of Session - 06/16/19 1644    Visit Number  15    Number of Visits  19    Date for PT Re-Evaluation  05/26/19    Authorization Type  [employer]na medicare    Authorization - Visit Number  6    Authorization - Number of Visits  9    PT Start Time  1450    PT Stop Time  1530    PT Time Calculation (min)  40 min    Activity Tolerance  Patient tolerated treatment well    Behavior During Therapy  [facility] for tasks assessed/performed       Past Medical History:  Diagnosis Date  . melanoma 1990    Past Surgical History:  Procedure Laterality Date  . CESAREAN SECTION    . FREE FLAP RADIAL FOREARM  03/06/2019   Dr. Nicolette [facility]  . MODIFIED RADICAL NECK DISSECTION  03/06/2019  . NECK DISSECTION  03/06/2019   bilateral neck dissection  . NERVE GRAFT Left 03/06/2019   left arm lower, Dr. Nicolette [facility] at [facility]  . PHARYNGECTOMY  03/06/2019   [facility]ico[facility] at [facility]  . TOTAL LARYNGECTOMY  03/06/2019   Dr[facility] Nico[facility] at [facility]  . 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 Nico[facility]  . WRIST SURGERY  06/06/2018   ORIF distal radius fracture    There were no vitals filed for this visit.  Subjective Assessment - 06/16/19 1644    Subjective  PT has no complaints    Pertinent History  laryngectomy, current radiation tx    Patient Stated Goals  less swelling, to be able to swallow better.    Currently in Pain?  No/denies                       Melody Melody [facility] Adult PT Treatment/Exercise - 06/16/19 0001      Exercises   Exercises  Neck      Neck Exercises: Seated   Cervical Rotation  Both;5 reps    Lateral Flexion  Both;5 reps    Lateral Flexion Limitations  extension x 5       Neck Exercises: Supine   Cervical Rotation  Both;5 reps    Lateral Flexion  Both;5 reps      Manual Therapy   Manual Therapy  Joint mobilization;Soft tissue mobilization;Manual Lymphatic Drainage (MLD);Compression Bandaging    Manual therapy comments  completed seperate from all other aspects of treatment     Joint Mobilization  for improved ROM C2-C6 B     Soft tissue mobilization  for scar area with encouragement to pt to complete self massage to scar area.    Manual Lymphatic Drainage (MLD)  for thoracic/ cervical and facial                PT Short Term Goals - 05/25/19 1408      PT SHORT TERM GOAL #1   Title  Pt to be I in wearing her compression four hours a day to decrease fibrosis of skin.    Time  2    Period  Weeks    Status  Achieved    Target Date  05/12/19        PT Long Term Goals - 06/03/19 1425      PT LONG TERM GOAL #1   Title  PT to be I in self manual to decrease edema in cervical area.    Time  4    Period  Weeks    Status  Achieved      PT LONG TERM GOAL #2   Title  PT cervical rotation to improve 10 degrees both directions for safer driving    Time  4    Period  Weeks    Status  Achieved      PT LONG TERM GOAL #3   Title  PT measurement to have decreased by 1 cm to demonstrate decreased cervical edema.    Time  4    Period  Weeks    Status  Achieved      PT LONG TERM GOAL #4   Title  PT to decrease cervical volume by 2 cm    Time  3    Period  Weeks    Status  New    Target Date  06/24/19      PT LONG TERM GOAL #5   Title  PT to be able to rotate cervical spine 65 degrees for improved safety in driving.    Time  3    Period  Weeks    Status  New    Target Date  06/24/19            Plan - 06/16/19 1645    Clinical Impression Statement  Noted decreased edema following manual.  Therapist encouraged pt to complete self manual techniques more often.    Examination-Activity  Limitations  Other    Examination-Participation Restrictions  Other;Driving    Stability/Clinical Decision Making  Stable/Uncomplicated    Rehab Potential  Good    PT Frequency  3x / week    PT Duration  4 weeks    PT Treatment/Interventions  Manual techniques;Manual lymph drainage;Patient/family education    PT Next Visit Plan  Continues with scar massage as well as manual lymphatic decongestive techniques.    PT Home Exercise Plan  manual and using compression       Patient will benefit from skilled therapeutic intervention in order to improve the following deficits and impairments:  Decreased skin integrity, Decreased range of motion, Increased edema  Visit Diagnosis: Lymphedema, not elsewhere classified  Stiffness of cervical spine     Problem List Patient Active Problem List   Diagnosis Date Noted  . 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 Osteopenia 04/17/2016  . Sun-damaged skin 03/31/2016    Melody Melody Ward, PT CLT [phone] 06/16/2019, 4:46 PM  [place] [address] [place], [place], [address] Phone: [phone]   Fax:  [phone]  Name: Melody Melody Ward MRN: [id number] Date of Birth: 1951-03-17

## 2019-06-17 ENCOUNTER — Encounter (HOSPITAL_COMMUNITY): Payer: Self-pay | Admitting: Speech Pathology

## 2019-06-17 ENCOUNTER — Ambulatory Visit (HOSPITAL_COMMUNITY)
Admission: RE | Admit: 2019-06-17 | Discharge: 2019-06-17 | Disposition: A | Discharge status: Home / Self Care | Payer: Medicare HMO | Source: Ambulatory Visit | Attending: Radiation Oncology | Admitting: Radiation Oncology

## 2019-06-17 ENCOUNTER — Ambulatory Visit (HOSPITAL_COMMUNITY): Payer: Medicare HMO | Admitting: Speech Pathology

## 2019-06-17 ENCOUNTER — Ambulatory Visit (HOSPITAL_COMMUNITY): Payer: Medicare HMO | Admitting: Physical Therapy

## 2019-06-17 DIAGNOSIS — R1312 Dysphagia, oropharyngeal phase: Secondary | ICD-10-CM | POA: Insufficient documentation

## 2019-06-17 DIAGNOSIS — M436 Torticollis: Secondary | ICD-10-CM

## 2019-06-17 DIAGNOSIS — R1313 Dysphagia, pharyngeal phase: Secondary | ICD-10-CM

## 2019-06-17 DIAGNOSIS — I890 Lymphedema, not elsewhere classified: Secondary | ICD-10-CM

## 2019-06-17 DIAGNOSIS — R633 Feeding difficulties: Secondary | ICD-10-CM | POA: Diagnosis not present

## 2019-06-17 DIAGNOSIS — R1310 Dysphagia, unspecified: Secondary | ICD-10-CM | POA: Diagnosis not present

## 2019-06-17 NOTE — Therapy (Signed)
[place], [place], [address] Phone: [phone]   Fax:  [phone]  Physical Therapy Treatment  Patient Details  Name: Melody Ward MRN: [id number] Date of Birth: [birth year] Referring Provider (PT): Eppie Gibson    Encounter Date: 06/17/2019  PT End of Session - 06/17/19 1542    Visit Number  16    Number of Visits  19    Date for PT Re-Evaluation  05/26/19    Authorization Type  Aetna medicare    Authorization - Visit Number  7    Authorization - Number of Visits  9    Progress Note Due on Visit  21    PT Start Time  1400    PT Stop Time  1445    PT Time Calculation (min)  45 min    Activity Tolerance  Patient tolerated treatment well    Behavior During Therapy  [facility] for tasks assessed/performed       Past Medical History:  Diagnosis Date  . melanoma 1990    Past Surgical History:  Procedure Laterality Date  . CESAREAN SECTION    . FREE FLAP RADIAL FOREARM  03/06/2019   Dr. Nicolette Bang [facility]  . MODIFIED RADICAL NECK DISSECTION  03/06/2019  . NECK DISSECTION  03/06/2019   bilateral neck dissection  . NERVE GRAFT Left 03/06/2019   left arm lower, Dr[facility]ico[facility] Bang at [facility]  . PHARYNGECTOMY  03/06/2019   Dr. Nico[facility] Bang at [facility]  . TOTAL LARYNGECTOMY  03/06/2019   Dr. Nico[facility] Bang at [facility]  . 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 Nicolette Bang [facility]  . WRIST SURGERY  06/06/2018   ORIF distal radius fracture    There were no vitals filed for this visit.  Subjective Assessment - 06/17/19 1543    Subjective  Pt had her swallow test and states that she is not swallowing well.  Pt states that she has not been using her compression like she should as it is not comfortable    Pertinent History  laryngectomy, current radiation tx    Patient Stated Goals  less swelling, to be able to swallow better.    Currently in Pain?  No/denies            LYMPHEDEMA/ONCOLOGY  QUESTIONNAIRE - 06/17/19 1407      Surgeries   Other Surgery Date  03/05/20  (Pended)     Number Lymph Nodes Removed  61  (Pended)       Date Lymphedema/Swelling Started   Date  03/07/19  (Pended)       Treatment   Active Radiation Treatment  Yes  (Pended)       What other symptoms do you have   Are you Having Heaviness or Tightness  Yes  (Pended)     Are you having Pain  No  (Pended)     Are you having pitting edema  No  (Pended)     Is it Hard or Difficult finding clothes that fit  No  (Pended)     Do you have infections  --  (Pended)    past not now    Is there Decreased scar mobility  Yes  (Pended)       Lymphedema Assessments   Lymphedema Assessments  Head and Neck  (Pended)       Head and Neck   Right Lateral Nostril at base of nose to medial tragus   7.5 cm  (Pended)  was 9.2   Left Lateral Nostril at base of nose to medial tragus   7.2 cm  (Pended)    was 9.1   Right Corner of mouth to where ear lobe meets face  6.7 cm  (Pended)    was 8.6   Left Corner of mouth to where ear lobe meets face  6.3 cm  (Pended)    was 8.3   4 cm superior to sternal notch around neck  29.6 cm  (Pended)    was 30.8   6 cm superior to sternal notch around neck  28.9 cm  (Pended)    was 32.8    8 cm superior to sternal notch around neck  30.4 cm  (Pended)                 [facility] Adult PT Treatment/Exercise - 06/17/19 0001      Exercises   Exercises  Neck      Neck Exercises: Seated   Cervical Rotation  Both;5 reps    Lateral Flexion  Both;5 reps    Lateral Flexion Limitations  extension x 5       Neck Exercises: Supine   Cervical Rotation  Both;5 reps    Lateral Flexion  Both;5 reps      Manual Therapy   Manual Therapy  Joint mobilization;Soft tissue mobilization;Manual Lymphatic Drainage (MLD);Compression Bandaging    Manual therapy comments  completed seperate from all other aspects of treatment     Joint Mobilization  for improved ROM C2-C6 B     Soft tissue  mobilization  for scar area with encouragement to pt to complete self massage to scar area.    Manual Lymphatic Drainage (MLD)  for thoracic/ cervical and facial                PT Short Term Goals - 05/25/19 1408      PT SHORT TERM GOAL #1   Title  Pt to be I in wearing her compression four hours a day to decrease fibrosis of skin.    Time  2    Period  Weeks    Status  Achieved    Target Date  05/12/19        PT Long Term Goals - 06/03/19 1425      PT LONG TERM GOAL #1   Title  PT to be I in self manual to decrease edema in cervical area.    Time  4    Period  Weeks    Status  Achieved      PT LONG TERM GOAL #2   Title  PT cervical rotation to improve 10 degrees both directions for safer driving    Time  4    Period  Weeks    Status  Achieved      PT LONG TERM GOAL #3   Title  PT measurement to have decreased by 1 cm to demonstrate decreased cervical edema.    Time  4    Period  Weeks    Status  Achieved      PT LONG TERM GOAL #4   Title  PT to decrease cervical volume by 2 cm    Time  3    Period  Weeks    Status  New    Target Date  06/24/19      PT LONG TERM GOAL #5   Title  PT to be able to rotate cervical spine 65 degrees for improved safety in  driving.    Time  3    Period  Weeks    Status  New    Target Date  06/24/19            Plan - 06/17/19 1547    Clinical Impression Statement  Pt measured for volume; pt neck is stable but noted decreased in facial area however this is most likely due to loss of weight.  Therapist urged pt to bring the compression in so that it can be trimmed to fit pt better for improved compliance in wearing it or purchase on of the off the shelf garments that therapist had shown pt.    Examination-Activity Limitations  Other    Examination-Participation Restrictions  Other;Driving    Stability/Clinical Decision Making  Stable/Uncomplicated    Rehab Potential  Good    PT Frequency  3x / week    PT Duration  4  weeks    PT Treatment/Interventions  Manual techniques;Manual lymph drainage;Patient/family education    PT Next Visit Plan  Continues with scar massage as well as manual lymphatic decongestive techniques; pt will be ready for discharge next week.    PT Home Exercise Plan  manual and using compression       Patient will benefit from skilled therapeutic intervention in order to improve the following deficits and impairments:  Decreased skin integrity, Decreased range of motion, Increased edema  Visit Diagnosis: Lymphedema, not elsewhere classified  Stiffness of cervical spine     Problem List Patient Active Problem List   Diagnosis Date Noted  . 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 Osteopenia 04/17/2016  . Sun-damaged skin 03/31/2016    [name], PT CLT [phone] 06/17/2019, 3:50 PM  [place] [address] [place], [place], [address] Phone: [phone]   Fax:  [phone]  Name: TYRIE BUCKLE MRN: [id number] Date of Birth: June 09, 1950

## 2019-06-17 NOTE — Therapy (Signed)
[place], [place], [address] Phone: [phone]   Fax:  [phone]  Modified Barium Swallow  Patient Details  Name: Melody Ward MRN: [id number] Date of Birth: 1950-06-01 Referring Provider (SLP): Eppie Gibson, MD   Encounter Date: 06/17/2019  End of Session - 06/17/19 2128    Visit Number  1    Number of Visits  2    Authorization Type  Aetna Medicare    SLP Start Time  1130    SLP Stop Time   1210    SLP Time Calculation (min)  40 min    Activity Tolerance  Patient tolerated treatment well       Past Medical History:  Diagnosis Date  . melanoma 1990    Past Surgical History:  Procedure Laterality Date  . CESAREAN SECTION    . FREE FLAP RADIAL FOREARM  03/06/2019   [facility]  . MODIFIED RADICAL NECK DISSECTION  03/06/2019  . NECK DISSECTION  03/06/2019   bilateral neck dissection  . NERVE GRAFT Left 03/06/2019   left arm lower, Dr. Nico[facility] Bang at [facility]  . PHARYNGECTOMY  03/06/2019   Dr. Nico[facility] Bang at [facility]  . TOTAL LARYNGECTOMY  03/06/2019   Dr. [facility]ico[facility] Bang at [facility]  . 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 Nicolette Bang [facility]  . WRIST SURGERY  06/06/2018   ORIF distal radius fracture    There were no vitals filed for this visit.  Subjective Assessment - 06/17/19 2101    Subjective  "I saw Melody Ward last week to check my TEP."    Special Tests  MBSS    Currently in Pain?  No/denies        HPI: Developed hoarseness Jan '19, but not problematic until after URI in 06/2018. 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 Dr. 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 Large mass found on neck CT with nodal involvement on lt. [name] recommeded surgery - Dr. Nicolette Bang performed total laryngectomy on 03-03-19 with partial lt pharyngetomy, and total neck  dissection on 03-06-19. Pt fitted with TEP and HME - intelligibility is 100% although pt exhibits consistent hydrophonia    General - 06/17/19 2104      General Information   Date of Onset  03/03/19    Type of Study  MBS-Modified Barium Swallow Study    Diet Prior to this Study  Dysphagia 3 (soft);Dysphagia 1 (puree);Thin liquids    Temperature Spikes Noted  No    Respiratory Status  Room air    History of Recent Intubation  No    Behavior/Cognition  Cooperative;Alert;Pleasant mood    Oral Cavity Assessment  Within Functional Limits    Oral Care Completed by SLP  No    Oral Cavity - Dentition  Adequate natural dentition    Vision  Functional for self feeding    Self-Feeding Abilities  Able to feed self    Patient Positioning  Upright in chair    Baseline Vocal Quality  Other (comment)   Pt has TEP   Volitional Cough  Strong    Volitional Swallow  Able to elicit    Anatomy  Other (Comment)   Pt with partial L pharyngectomy and total laryngectomy with TEP   Pharyngeal Secretions  Not observed secondary MBS         Oral  Preparation/Oral Phase - 06/17/19 2106      Oral Preparation/Oral Phase   Oral Phase  Within functional limits      Electrical stimulation - Oral Phase   Was Electrical Stimulation Used  No       Pharyngeal Phase - 06/17/19 2106      Pharyngeal Phase   Pharyngeal Phase  Impaired      Pharyngeal - Thin   Pharyngeal- Thin Teaspoon  Pharyngeal residue - pyriform;Pharyngeal residue - cp segment    Pharyngeal- Thin Cup  Pharyngeal residue - cp segment;Pharyngeal residue - pyriform    Pharyngeal- Thin Straw  Pharyngeal residue - pyriform;Pharyngeal residue - cp segment      Pharyngeal - Solids   Pharyngeal- Puree  Pharyngeal residue - pyriform;Pharyngeal residue - cp segment    Pharyngeal- Regular  Pharyngeal residue - pyriform;Pharyngeal residue - cp segment      Electrical Stimulation - Pharyngeal Phase   Was Electrical Stimulation Used  No        Cricopharyngeal Phase - 06/17/19 2110      Cervical Esophageal Phase   Cervical Esophageal Phase  Impaired      Cervical Esophageal Phase - Thin   Thin Teaspoon  Reduced cricopharyngeal relaxation;Esophageal backflow into the pharynx    Thin Cup  Reduced cricopharyngeal relaxation;Esophageal backflow into the pharynx    Thin Straw  Reduced cricopharyngeal relaxation;Esophageal backflow into the pharynx      Cervical Esophageal Phase - Solids   Puree  Reduced cricopharyngeal relaxation;Esophageal backflow into the pharynx    Regular  Reduced cricopharyngeal relaxation;Esophageal backflow into the pharynx      Cervical Esophageal Phase - Comment   Cervical Esophageal Comment  reduced relaxation of UES and suspect reduced clearance around TEP, pharyngeal edema s/p radiation?           SLP Short Term Goals - 06/03/19 1101      SLP SHORT TERM GOAL #1   Title  Pt will complete HEP with rare min A over two sessions    Time  4    Period  --   sessions, for all STGs   Status  On-going      SLP SHORT TERM GOAL #2   Title  Pt will tell SLP rationale for HEP completion over two sessions    Time  4    Status  On-going      SLP SHORT TERM GOAL #3   Title  Pt will tell SLP how a food journal can hasten/facilitate return to more normalized diet    Time  4    Status  On-going       SLP Long Term Goals - 06/03/19 1101      SLP LONG TERM GOAL #1   Title  Pt will complete HEP with modified independence over 3 visits    Time  5    Period  --   visits, for all LTGs   Status  On-going      SLP LONG TERM GOAL #2   Title  Pt will tell SLP when to decr frequency of HEP    Time  8    Status  On-going       Plan - 06/17/19 2129    Clinical Impression Statement  Pt presents with moderate/severe pharyngo esophageal dysphagia with edematous posterior pharyngeal wall limiting pharyngeal constriction (seemingly adequate lingual propulsion) and narrowing noted in pharynx and again at  the level of the presumed pharyngoesophageal segment proximal to TEP.  Pt assessed with tsp/cup/straw thin barium, puree, and regular textures. The liquids move most efficiently through pharynx and PES, however some backflow into pharynx observed. Pt had greater difficulty moving puree and solid textures despite reclining Pt, head turns, and liquid wash. Recommend liquid diet with some purees until Pt can be seen by her ENT Nicolette Bang) for possible consideration of dilation. SLP will continue to follow for dysphagia. ? Post radiation edema along posterior pharyngeal wall as well.    SLP Home Exercise Plan  Continue HEP as assigned    Consulted and Agree with Plan of Care  Patient       Patient will benefit from skilled therapeutic intervention in order to improve the following deficits and impairments:   Dysphagia, pharyngeal phase    Recommendations/Treatment - 06/17/19 2113      Swallow Evaluation Recommendations   Recommended Consults  Consider esophageal assessment   consider EGD   SLP Diet Recommendations  Thin;Dysphagia 1 (puree);Dysphagia 3 (mechanical soft)    Liquid Administration via  Cup;Straw    Medication Administration  Crushed with puree    Supervision  Patient able to self feed    Compensations  Small sips/bites;Multiple dry swallows after each bite/sip;Other (Comment)   attempt upper body/head in reclined position   Postural Changes  Remain upright for at least 30 minutes after feeds/meals       Prognosis - 06/17/19 2115      Prognosis   Prognosis for Safe Diet Advancement  Guarded    Barriers to Reach Goals  Severity of deficits    Barriers/Prognosis Comment  hopefully some post radiation edema will lessen      Individuals Consulted   Consulted and Agree with Results and Recommendations  Patient;Other (Comment)   SLP at [facility], [name]      Problem List Patient Active Problem List   Diagnosis Date Noted  . 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  Osteopenia 04/17/2016  . Sun-damaged skin 03/31/2016   Thank you,  [name], [place]  [facility] 06/17/2019, 9:40 PM  [place] [address] [place], [place], [address] Phone: [phone]   Fax:  [phone]  Name: Melody Ward MRN: [id number] Date of Birth: [birth year]

## 2019-06-18 NOTE — Progress Notes (Signed)
Melody Ward presents for follow up of radiation completed 06/01/19 to her neck.     Pain issues, if any: She denies.  Using a feeding tube?: N/A Weight changes, if any:  Wt Readings from Last 3 Encounters:  06/19/19 106 lb 8 oz (48.3 kg)  05/20/19 110 lb 6.4 oz (50.1 kg)  04/10/19 118 lb 4.8 oz (53.7 kg)   Swallowing issues, if any: She had a swallow test yesterday. She was told to eat softer foods.  Smoking or chewing tobacco? No Using fluoride trays daily? She is using fluoride toothpaste Last ENT visit was on: Dr. Nicolette Bang, not since diagnosis.  Other notable issues, if any:  She has a dry throat and continued taste changes.  She is having difficulty with her laryngeal tube. She does not have it in today due to difficulty inserting it and talking.   BP 105/69 (BP Location: Left Arm, Patient Position: Sitting)   Pulse 62   Temp 97.8 F (36.6 C) (Temporal)   Resp 18   Ht 5\' 3"  (1.6 m)   Wt 106 lb 8 oz (48.3 kg)   SpO2 100%   BMI 18.87 kg/m

## 2019-06-19 ENCOUNTER — Encounter: Payer: Self-pay | Admitting: Nutrition

## 2019-06-19 ENCOUNTER — Ambulatory Visit (HOSPITAL_COMMUNITY): Payer: Medicare HMO | Admitting: Physical Therapy

## 2019-06-19 ENCOUNTER — Encounter: Payer: Self-pay | Admitting: Radiation Oncology

## 2019-06-19 ENCOUNTER — Ambulatory Visit
Admission: RE | Admit: 2019-06-19 | Discharge: 2019-06-19 | Disposition: A | Discharge status: Home / Self Care | Payer: Medicare HMO | Source: Ambulatory Visit | Attending: Radiation Oncology | Admitting: Radiation Oncology

## 2019-06-19 ENCOUNTER — Other Ambulatory Visit: Payer: Self-pay

## 2019-06-19 VITALS — BP 105/69 | HR 62 | Temp 97.8°F | Resp 18 | Ht 63.0 in | Wt 106.5 lb

## 2019-06-19 DIAGNOSIS — M436 Torticollis: Secondary | ICD-10-CM

## 2019-06-19 DIAGNOSIS — Z923 Personal history of irradiation: Secondary | ICD-10-CM | POA: Insufficient documentation

## 2019-06-19 DIAGNOSIS — R1313 Dysphagia, pharyngeal phase: Secondary | ICD-10-CM | POA: Diagnosis not present

## 2019-06-19 DIAGNOSIS — C320 Malignant neoplasm of glottis: Secondary | ICD-10-CM | POA: Insufficient documentation

## 2019-06-19 DIAGNOSIS — R634 Abnormal weight loss: Secondary | ICD-10-CM

## 2019-06-19 DIAGNOSIS — I890 Lymphedema, not elsewhere classified: Secondary | ICD-10-CM | POA: Diagnosis not present

## 2019-06-19 MED ORDER — DEXAMETHASONE 0.5 MG/5ML PO SOLN: ORAL | 0 refills | Status: DC

## 2019-06-19 NOTE — Progress Notes (Signed)
Provided one complimentary case of ensure enlive. 

## 2019-06-19 NOTE — Progress Notes (Addendum)
Radiation Oncology         [phone] ________________________________  Name: Melody Ward MRN: [id number]  Date: 06/19/2019  DOB: [birth year]  Follow-Up Visit Note in person  CC: Luking, Melody Bushy, Ward  Melody Ames, Ward  Diagnosis and Prior Radiotherapy:       ICD-10-CM   1. Loss of weight  R63.4 TSH  2. 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  [name] presents for follow up of radiation completed 06/01/19 to her neck.   Pain issues, if any: She denies.  Using a feeding tube?: N/A Weight changes, if any:  Wt Readings from Last 3 Encounters:  06/19/19 106 lb 8 oz (48.3 kg)  05/20/19 110 lb 6.4 oz (50.1 kg)  04/10/19 118 lb 4.8 oz (53.7 kg)   Swallowing issues, if any: She had a swallow test yesterday. She was told to eat softer foods due to dysphagia Smoking or chewing tobacco? No Using fluoride trays daily? She is using fluoride toothpaste Last ENT visit was on: Melody Ward, not since diagnosis.    She has a dry throat and continued taste changes.  She is having difficulty with her laryngeal tube. She does not have it in today due to difficulty inserting it and talking.                     Melody Ward SLP and Melody Ward SLP both have concerns about pharyngeal edema affecting patient's swallowing/speech.  ALLERGIES:  has No Known Allergies.  Meds: Current Outpatient Medications  Medication Sig Dispense Refill  . sodium fluoride (PREVIDENT 5000 PLUS) 1.1 % CREA dental cream Apply to tooth brush. Brush teeth for 2 minutes. Spit out excess-DO NOT swallow. Repeat nightly. 1 Tube prn  . acetaminophen (TYLENOL) 500 MG tablet Take by mouth.    . celecoxib (CELEBREX) 200 MG capsule     .  dexamethasone (DECADRON) 0.5 MG/5ML solution Take 4mg  BID through 3/1; then take 4mg  daily through 3/5; then take 2mg  daily through 3/9; then take 1mg  daily through 3/13. Stop after that. Take with food. 610 mL 0  . lidocaine (XYLOCAINE) 2 % solution Patient: Mix 1part 2% viscous lidocaine, 1part H20. Swish & swallow 88mL of diluted mixture, 27min before meals and at bedtime, up to QID (Patient not taking: Reported on 06/19/2019) 200 mL 3  . oxyCODONE (OXY IR/ROXICODONE) 5 MG immediate release tablet     . pantoprazole sodium (PROTONIX) 40 mg/20 mL PACK Give 20 mLs by tube 2 (two) times daily.    . polyethylene glycol powder (GLYCOLAX/MIRALAX) 17 GM/SCOOP powder 17 g by Per NG tube route daily.    . traMADol (ULTRAM) 50 MG tablet      No current facility-administered medications for this encounter.    Physical Findings: The patient is in no acute distress. Patient is alert and oriented. Wt Readings from Last 3 Encounters:  06/19/19 106 lb 8 oz (48.3 kg)  05/20/19 110 lb 6.4 oz (50.1 kg)  04/10/19 118 lb 4.8 oz (53.7 kg)    height is 5\' 3"  (1.6 m) and weight is 106 lb 8 oz (48.3 kg). Her temporal temperature is 97.8 F (36.6 C). Her blood  pressure is 105/69 and her pulse is 62. Her respiration is 18 and oxygen saturation is 100%. .  General: Alert and oriented, in no acute distress HEENT: Head is normocephalic. Extraocular movements are intact. Oropharynx is notable for no mucositis or thrush.  Neck: Neck is notable for lymphedema-  Stoma narrowed. See photo Skin: Skin in treatment fields shows satisfactory healing  Psychiatric: Judgment and insight are intact. Affect is appropriate.   Lab Findings: Lab Results  Component Value Date   WBC 7.6 12/21/2006   HGB 12.2 12/21/2006   HCT 35.3 (L) 12/21/2006   MCV 92.8 12/21/2006   PLT 162 12/21/2006    Lab Results  Component Value Date   TSH 1.530 03/29/2016    Radiographic Findings: DG OP Swallowing Func-Medicare/Speech  Path  Result Date: 06/18/2019 Melody Ward [place], [place], [address] Phone: [phone]   Fax:  [phone] Modified Barium Swallow Patient Details Name: Melody Ward MRN: [id number] Date of Birth: 04/29/1950 Referring Provider (SLP): Melody Ward Encounter Date: 06/17/2019 End of Session - 06/17/19 2128   Visit Number  1   Number of Visits  2   Authorization Type  A[employer]na Medicare   SLP Start Time  1130   SLP Stop Time   1210   SLP Time Calculation (min)  40 min   Activity Tolerance  Patient tolerated treatment well   Past Medical History: Diagnosis Date . melanoma 1990 Past Surgical History: Procedure Laterality Date . CESAREAN SECTION   . FREE FLAP RADIAL FOREARM  03/06/2019  Melody Ward [facility] . MODIFIED RADICAL NECK DISSECTION  03/06/2019 . NECK DISSECTION  03/06/2019  bilateral neck dissection . NERVE GRAFT Left 03/06/2019  left arm lower, Melody Ward at [facility] . PHARYNGECTOMY  03/06/2019  Melody Ward at [facility] . TOTAL LARYNGECTOMY  03/06/2019  Melody Ward at [facility] . VOICE PROSTHESIS  03/06/2019  pharyngoplasty, voice prosthesis/TEP placement, Melody Ward [facility] . WRIST SURGERY  06/06/2018  ORIF distal radius fracture There were no vitals filed for this visit. Subjective Assessment - 06/17/19 2101   Subjective  "I saw Melody Ward last week to check my TEP."   Special Tests  MBSS   Currently in Pain?  No/denies    HPI: Developed hoarseness Jan '19, but not problematic until after URI in 06/2018. Melody Ward did laryngoscopy July 2020 and treated for LPR. 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 Large mass found on neck CT with nodal involvement on lt. Melody Ward recommeded surgery - Melody Ward performed total laryngectomy on 03-03-19 with partial lt pharyngetomy, and total neck dissection on 03-06-19. Pt fitted with TEP and HME - intelligibility is 100% although pt  exhibits consistent hydrophonia General - 06/17/19 2104    General Information  Date of Onset  03/03/19   Type of Study  MBS-Modified Barium Swallow Study   Diet Prior to this Study  Dysphagia 3 (soft);Dysphagia 1 (puree);Thin liquids   Temperature Spikes Noted  No   Respiratory Status  Room air   History of Recent Intubation  No   Behavior/Cognition  Cooperative;Alert;Pleasant mood   Oral Cavity Assessment  Within Functional Limits   Oral Care Completed by SLP  No   Oral Cavity - Dentition  Adequate natural dentition   Vision  Functional for self feeding   Self-Feeding Abilities  Able to feed self   Patient Positioning  Upright in chair   Baseline Vocal Quality  Other (comment)  Pt has TEP  Volitional Cough  Strong   Volitional Swallow  Able to elicit   Anatomy  Other (Comment)  Pt with partial L pharyngectomy and total laryngectomy with TEP  Pharyngeal Secretions  Not observed secondary MBS   Oral Preparation/Oral Phase - 06/17/19 2106    Oral Preparation/Oral Phase  Oral Phase  Within functional limits    Electrical stimulation - Oral Phase  Was Electrical Stimulation Used  No   Pharyngeal Phase - 06/17/19 2106    Pharyngeal Phase  Pharyngeal Phase  Impaired    Pharyngeal - Thin  Pharyngeal- Thin Teaspoon  Pharyngeal residue - pyriform;Pharyngeal residue - cp segment   Pharyngeal- Thin Cup  Pharyngeal residue - cp segment;Pharyngeal residue - pyriform   Pharyngeal- Thin Straw  Pharyngeal residue - pyriform;Pharyngeal residue - cp segment    Pharyngeal - Solids  Pharyngeal- Puree  Pharyngeal residue - pyriform;Pharyngeal residue - cp segment   Pharyngeal- Regular  Pharyngeal residue - pyriform;Pharyngeal residue - cp segment    Electrical Stimulation - Pharyngeal Phase  Was Electrical Stimulation Used  No   Cricopharyngeal Phase - 06/17/19 2110    Cervical Esophageal Phase  Cervical Esophageal Phase  Impaired    Cervical Esophageal Phase - Thin  Thin Teaspoon  Reduced cricopharyngeal relaxation;Esophageal backflow  into the pharynx   Thin Cup  Reduced cricopharyngeal relaxation;Esophageal backflow into the pharynx   Thin Straw  Reduced cricopharyngeal relaxation;Esophageal backflow into the pharynx    Cervical Esophageal Phase - Solids  Puree  Reduced cricopharyngeal relaxation;Esophageal backflow into the pharynx   Regular  Reduced cricopharyngeal relaxation;Esophageal backflow into the pharynx    Cervical Esophageal Phase - Comment  Cervical Esophageal Comment  reduced relaxation of UES and suspect reduced clearance around TEP, pharyngeal edema s/p radiation?   SLP Short Term Goals - 06/03/19 1101    SLP SHORT TERM GOAL #1  Title  Pt will complete HEP with rare min A over two sessions   Time  4   Period  --  sessions, for all STGs  Status  On-going    SLP SHORT TERM GOAL #2  Title  Pt will tell SLP rationale for HEP completion over two sessions   Time  4   Status  On-going    SLP SHORT TERM GOAL #3  Title  Pt will tell SLP how a food journal can hasten/facilitate return to more normalized diet   Time  4   Status  On-going   SLP Long Term Goals - 06/03/19 1101    SLP LONG TERM GOAL #1  Title  Pt will complete HEP with modified independence over 3 visits   Time  5   Period  --  visits, for all LTGs  Status  On-going    SLP LONG TERM GOAL #2  Title  Pt will tell SLP when to decr frequency of HEP   Time  8   Status  On-going   Plan - 06/17/19 2129   Clinical Impression Statement  Pt presents with moderate/severe pharyngo esophageal dysphagia with edematous posterior pharyngeal wall limiting pharyngeal constriction (seemingly adequate lingual propulsion) and narrowing noted in pharynx and again at the level of the presumed pharyngoesophageal segment proximal to TEP. Pt assessed with tsp/cup/straw thin barium, puree, and regular textures. The liquids move most efficiently through pharynx and PES, however some backflow into pharynx observed. Pt had greater difficulty moving puree and solid textures despite  reclining Pt, head turns,  and liquid wash. Recommend liquid diet with some purees until Pt can be seen by her ENT Melody Ward) for possible consideration of dilation. SLP will continue to follow for dysphagia. ? Post radiation edema along posterior pharyngeal wall as well.   SLP Home Exercise Plan  Continue HEP as assigned   Consulted and Agree with Plan of Care  Patient   Patient will benefit from skilled therapeutic intervention in order to improve the following deficits and impairments:  Dysphagia, pharyngeal phase Recommendations/Treatment - 06/17/19 2113    Swallow Evaluation Recommendations  Recommended Consults  Consider esophageal assessment  consider EGD  SLP Diet Recommendations  Thin;Dysphagia 1 (puree);Dysphagia 3 (mechanical soft)   Liquid Administration via  Cup;Straw   Medication Administration  Crushed with puree   Supervision  Patient able to self feed   Compensations  Small sips/bites;Multiple dry swallows after each bite/sip;Other (Comment)  attempt upper body/head in reclined position  Postural Changes  Remain upright for at least 30 minutes after feeds/meals   Prognosis - 06/17/19 2115    Prognosis  Prognosis for Safe Diet Advancement  Guarded   Barriers to Reach Goals  Severity of deficits   Barriers/Prognosis Comment  hopefully some post radiation edema will lessen    Individuals Consulted  Consulted and Agree with Results and Recommendations  Patient;Other (Comment)  SLP at [facility], Melody Ward  Problem List Patient Active Problem List  Diagnosis Date Noted . Malignant neoplasm of glottis ([place]) 04/06/2019 . Osteopenia 04/17/2016 . Sun-damaged skin 03/31/2016 Thank you, Melody Ward, [place] 06/17/2019, 9:40 PM Friend [address] [place], [place], [address] Phone: [phone]   Fax:  [phone] Name: Melody Ward MRN: [id number] Date of Birth: 03-Jan-1951 CLINICAL DATA:  Dysphagia. Post total laryngectomy with tracheoesophageal voice prosthesis,  difficulty swallowing EXAM: MODIFIED BARIUM SWALLOW TECHNIQUE: Different consistencies of barium were administered orally to the patient by the Speech Pathologist. Imaging of the pharynx was performed in the lateral projection. The radiologist was present in the fluoroscopy room for this study, providing personal supervision. FLUOROSCOPY TIME:  Fluoroscopy Time:  4 minutes 0 seconds Radiation Exposure Index (if provided by the fluoroscopic device): 9.8 mGy Number of Acquired Spot Images: multiple fluoroscopic screen captures COMPARISON:  None FINDINGS: Patient with tracheostomy and tracheoesophageal voice prosthesis. Marked narrowing of the hypopharynx and cervical esophagus. Poor peristalsis involving the hypopharynx and proximal cervical esophagus. Significant retained contrast within the hypopharynx and proximal cervical esophagus, both with thin barium and applesauce consistencies evaluated. Please see dedicated speech pathology report. IMPRESSION: Significant narrowing of the hypopharynx and cervical esophagus which could be related to surgery and/or radiation therapy. Poor peristalsis with prolonged retention of contrast. Please refer to the Speech Pathologists report for complete details and recommendations. Electronically Signed   By: Lavonia Dana M.D.   On: 06/17/2019 12:56    Impression/Plan:    1) Head and Neck Cancer Status: healing from RT  I am starting her on a steroid taper for external and internal lymphedema. She is having a lot of trouble getting her laryngeal tube into her stoma-I showed her photo to Melody Melody Ward and we discussed by phone; he recommended she see Melody Ward SLP again to be fitted for smaller tube. I emailed her and the team to navigate that.   2) Nutritional Status: losing some weight -- push high calorie soft foods and shakes PEG tube: none  3) Risk Factors: The patient has been educated  about risk factors including alcohol and tobacco abuse; they understand that  avoidance of alcohol and tobacco is important to prevent recurrences as well as other cancers; abstaining   4) Swallowing: dysphagia - Melody Melody Ward aware -- swallowing studies images pushed to [facility].  Dexamethasone taper started today for edema in pharynx  5) Dental: Encouraged to continue regular followup with dentistry, and dental hygiene including fluoride rinses.    6) Thyroid function: check annually; labs ordered for May Lab Results  Component Value Date   TSH 1.530 03/29/2016    7)  Follow-up in May w/ CT neck and chest.  The patient was encouraged to call with any issues or questions before then.  On service date, in total, I spent 55 minutes on this encounter. _____________________________________   Melody Ward

## 2019-06-19 NOTE — Therapy (Signed)
[place], [place], [address] Phone: [phone]   Fax:  [phone]  Physical Therapy Treatment  Patient Details  Name: Melody Ward MRN: [id number] Date of Birth: [birth year] Referring Provider (PT): Eppie Gibson    Encounter Date: 06/19/2019  PT End of Session - 06/19/19 1210    Visit Number  17    Number of Visits  19    Date for PT Re-Evaluation  05/26/19    Authorization Type  Aetna medicare    Authorization - Visit Number  7    Authorization - Number of Visits  9    Progress Note Due on Visit  21    Activity Tolerance  Patient tolerated treatment well    Behavior During Therapy  [facility] for tasks assessed/performed       Past Medical History:  Diagnosis Date  . melanoma 1990    Past Surgical History:  Procedure Laterality Date  . CESAREAN SECTION    . FREE FLAP RADIAL FOREARM  03/06/2019   Dr. Nicolette Bang [facility]  . MODIFIED RADICAL NECK DISSECTION  03/06/2019  . NECK DISSECTION  03/06/2019   bilateral neck dissection  . NERVE GRAFT Left 03/06/2019   left arm lower, Dr. Nicolette Bang at [facility]  . PHARYNGECTOMY  03/06/2019   Dr. Nicolette Bang at [facility]  . TOTAL LARYNGECTOMY  03/06/2019   Dr. Nicolette Bang at [facility]  . 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 Nicolette Bang [facility]  . WRIST SURGERY  06/06/2018   ORIF distal radius fracture    There were no vitals filed for this visit.  Subjective Assessment - 06/19/19 1224    Subjective  Pt has no complaints states that she is not using the compression like she should; brought it with her so therapist could check fit.    Pertinent History  laryngectomy, current radiation tx    Patient Stated Goals  less swelling, to be able to swallow better.                       [facility] Adult PT Treatment/Exercise - 06/19/19 0001      Exercises   Exercises  Neck      Neck Exercises: Seated   Cervical Rotation  --    Lateral  Flexion  --    Lateral Flexion Limitations  --    Other Seated Exercise  bull dog stretches; high and low ee's      Neck Exercises: Supine   Cervical Rotation  Both;5 reps    Lateral Flexion  Both;5 reps      Manual Therapy   Manual Therapy  --    Manual therapy comments  completed seperate from all other aspects of treatment     Joint Mobilization  --    Soft tissue mobilization  for scar area with encouragement to pt to complete self massage to scar area.    Manual Lymphatic Drainage (MLD)  for thoracic/ cervical and facial     Compression Bandaging  trimmed compression to fit patient at this time.                PT Short Term Goals - 05/25/19 1408      PT SHORT TERM GOAL #1   Title  Pt to be I in wearing her compression four hours a day to decrease fibrosis of skin.    Time  2    Period  Weeks  Status  Achieved    Target Date  05/12/19        PT Long Term Goals - 06/03/19 1425      PT LONG TERM GOAL #1   Title  PT to be I in self manual to decrease edema in cervical area.    Time  4    Period  Weeks    Status  Achieved      PT LONG TERM GOAL #2   Title  PT cervical rotation to improve 10 degrees both directions for safer driving    Time  4    Period  Weeks    Status  Achieved      PT LONG TERM GOAL #3   Title  PT measurement to have decreased by 1 cm to demonstrate decreased cervical edema.    Time  4    Period  Weeks    Status  Achieved      PT LONG TERM GOAL #4   Title  PT to decrease cervical volume by 2 cm    Time  3    Period  Weeks    Status  New    Target Date  06/24/19      PT LONG TERM GOAL #5   Title  PT to be able to rotate cervical spine 65 degrees for improved safety in driving.    Time  3    Period  Weeks    Status  New    Target Date  06/24/19            Plan - 06/19/19 1227    Clinical Impression Statement  Therapist trimmed cervical foam compression to fit pt since she has decreased in edema.  Stressed the  importance of completing self manual and compression bandaging at home.  Pt has 2 more visits but therapist stated if all questions are answered regarding self maintenance next visit and she feels cofident she may be discharged next visit.    Examination-Activity Limitations  Other    Examination-Participation Restrictions  Other;Driving    Stability/Clinical Decision Making  Stable/Uncomplicated    Rehab Potential  Good    PT Frequency  3x / week    PT Duration  4 weeks    PT Treatment/Interventions  Manual techniques;Manual lymph drainage;Patient/family education    PT Next Visit Plan  answer andy questions on maintenance phase of treatment ; pt will be ready for discharge next week.    PT Home Exercise Plan  manual and using compression       Patient will benefit from skilled therapeutic intervention in order to improve the following deficits and impairments:  Decreased skin integrity, Decreased range of motion, Increased edema  Visit Diagnosis: Lymphedema, not elsewhere classified  Stiffness of cervical spine     Problem List Patient Active Problem List   Diagnosis Date Noted  . Malignant neoplasm of glottis ([place]) 04/06/2019  . Osteopenia 04/17/2016  . Sun-damaged skin 03/31/2016    [name], PT CLT [phone] 06/19/2019, 12:30 PM  [place] [address] [place], [place], [address] Phone: [phone]   Fax:  [phone]  Name: Melody Ward MRN: [id number] Date of Birth: [birth year]

## 2019-06-22 DIAGNOSIS — Z430 Encounter for attention to tracheostomy: Secondary | ICD-10-CM | POA: Diagnosis not present

## 2019-06-23 ENCOUNTER — Ambulatory Visit (HOSPITAL_COMMUNITY): Payer: Medicare HMO | Attending: Radiation Oncology | Admitting: Physical Therapy

## 2019-06-23 ENCOUNTER — Other Ambulatory Visit: Payer: Self-pay

## 2019-06-23 DIAGNOSIS — M436 Torticollis: Secondary | ICD-10-CM | POA: Diagnosis not present

## 2019-06-23 DIAGNOSIS — I890 Lymphedema, not elsewhere classified: Secondary | ICD-10-CM | POA: Insufficient documentation

## 2019-06-23 NOTE — Therapy (Signed)
[facility] [address] [place], [place], [address] Phone: [phone]   Fax:  [phone]  Physical Therapy Treatment  Patient Details  Name: Melody Ward MRN: [id number] Date of Birth: Mar 21, 1951 Referring Provider (PT): [place]  Visits from Start of Care: 18  Current functional level related to goals / functional outcomes: See below    Remaining deficits: Some edema and ROM deficits   Education / Equipment: Ex, self massage the importance of wearing compression  Plan: Patient agrees to discharge.  Patient goals were not met. Patient is being discharged due to meeting the stated rehab goals.  ?????       Encounter Date: 06/23/2019  PT End of Session - 06/23/19 1450    Visit Number  18    Number of Visits  18    Date for PT Re-Evaluation  05/26/19    Authorization Type  [employer] medicare    Authorization - Visit Number  8    Authorization - Number of Visits  8    Progress Note Due on Visit  21    PT Start Time  6644    PT Stop Time  1445    PT Time Calculation (min)  40 min    Activity Tolerance  Patient tolerated treatment well    Behavior During Therapy  WFL for tasks assessed/performed       Past Medical History:  Diagnosis Date  . melanoma 1990    Past Surgical History:  Procedure Laterality Date  . CESAREAN SECTION    . FREE FLAP RADIAL FOREARM  03/06/2019   Dr. Nicolette Bang [facility]  . MODIFIED RADICAL NECK DISSECTION  03/06/2019  . NECK DISSECTION  03/06/2019   bilateral neck dissection  . NERVE GRAFT Left 03/06/2019   left arm lower, Dr. Nicolette Bang at [facility]  . PHARYNGECTOMY  03/06/2019   Dr. Nicolette Bang at [facility]  . TOTAL LARYNGECTOMY  03/06/2019   Dr. Nicolette Bang at [facility]  . 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 Nicolette Bang [facility]  . WRIST SURGERY  06/06/2018   ORIF distal radius fracture    There were no vitals filed for this  visit.      [facility] PT Assessment - 06/23/19 0001      Assessment   Medical Diagnosis  cervical lymphedema     Referring Provider (PT)  Eppie Gibson     Onset Date/Surgical Date  03/06/19    Prior Therapy  none      Prior Function   Level of Independence  Independent      Cognition   Overall Cognitive Status  Within Functional Limits for tasks assessed      AROM   Cervical Flexion  45   was 35    Cervical Extension  52   was 40    Cervical - Right Side Bend  30   was 20   Cervical - Left Side Bend  30   was 20    Cervical - Right Rotation  65   was 48   Cervical - Left Rotation  55   45     Palpation   Palpation comment  noted increased fibrosis, decreased scar movement         LYMPHEDEMA/ONCOLOGY QUESTIONNAIRE - 06/23/19 1409      Surgeries   Other Surgery Date  03/05/20    Number Lymph Nodes Removed  47      Date Lymphedema/Swelling  Started   Date  03/07/19      Treatment   Active Radiation Treatment  Yes      What other symptoms do you have   Are you Having Heaviness or Tightness  Yes    Are you having Pain  No    Are you having pitting edema  No    Is it Hard or Difficult finding clothes that fit  No    Do you have infections  --   past not now    Is there Decreased scar mobility  Yes      Lymphedema Assessments   Lymphedema Assessments  Head and Neck      Head and Neck   Right Lateral Nostril at base of nose to medial tragus   7.5 cm   was 9.2   Left Lateral Nostril at base of nose to medial tragus   7 cm   was 9.1   Right Corner of mouth to where ear lobe meets face  6.7 cm   was 8.6   Left Corner of mouth to where ear lobe meets face  6.3 cm   was 8.3   4 cm superior to sternal notch around neck  29.5 cm   was 30.8   6 cm superior to sternal notch around neck  28.9 cm   was 32.8    8 cm superior to sternal notch around neck  30.4 cm                [facility] Adult PT Treatment/Exercise - 06/23/19 0001      Exercises    Exercises  Neck      Neck Exercises: Seated   Other Seated Exercise  bull dog stretches; high and low ee's      Neck Exercises: Supine   Cervical Rotation  Both;5 reps    Lateral Flexion  Both;5 reps      Manual Therapy   Manual therapy comments  completed seperate from all other aspects of treatment     Soft tissue mobilization  for scar area with encouragement to pt to complete self massage to scar area.    Manual Lymphatic Drainage (MLD)  for thoracic/ cervical and facial     Compression Bandaging  trimmed compression to fit patient at this time.                PT Short Term Goals - 05/25/19 1408      PT SHORT TERM GOAL #1   Title  Pt to be I in wearing her compression four hours a day to decrease fibrosis of skin.    Time  2    Period  Weeks    Status  Achieved    Target Date  05/12/19        PT Long Term Goals - 06/23/19 1452      PT LONG TERM GOAL #1   Title  PT to be I in self manual to decrease edema in cervical area.    Time  4    Period  Weeks    Status  Achieved      PT LONG TERM GOAL #2   Title  PT cervical rotation to improve 10 degrees both directions for safer driving    Time  4    Period  Weeks    Status  Achieved      PT LONG TERM GOAL #3   Title  PT measurement to have decreased by 1 cm to demonstrate  decreased cervical edema.    Time  4    Period  Weeks    Status  Achieved      PT LONG TERM GOAL #4   Title  PT to decrease cervical volume by 2 cm    Time  3    Period  Weeks    Status  Partially Met      PT LONG TERM GOAL #5   Title  PT to be able to rotate cervical spine 65 degrees for improved safety in driving.    Time  3    Period  Weeks    Status  Partially Met            Plan - 06/23/19 1450    Clinical Impression Statement  Pt remeasured and volumes have remained stable. Pt able to wear compression for almost four hours the other day, longest that she has been able to tolerate.  PT states trimming the foam assisted  in comfort.  Pt feels comfortable with self manual and discharge.    Examination-Activity Limitations  Other    Examination-Participation Restrictions  Other;Driving    Stability/Clinical Decision Making  Stable/Uncomplicated    Rehab Potential  Good    PT Frequency  3x / week    PT Duration  4 weeks    PT Treatment/Interventions  Manual techniques;Manual lymph drainage;Patient/family education    PT Next Visit Plan  Discharge pt.    PT Home Exercise Plan  manual and using compression       Patient will benefit from skilled therapeutic intervention in order to improve the following deficits and impairments:  Decreased skin integrity, Decreased range of motion, Increased edema  Visit Diagnosis: Lymphedema, not elsewhere classified  Stiffness of cervical spine     Problem List Patient Active Problem List   Diagnosis Date Noted  . 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 Osteopenia 04/17/2016  . Sun-damaged skin 03/31/2016    [name], PT CLT [phone] 06/23/2019, 2:53 PM  [facility] [address] [place], [place], [address] Phone: [phone]   Fax:  [phone]  Name: Melody Ward MRN: [id number] Date of Birth: 08-29-1950

## 2019-06-25 DIAGNOSIS — R1310 Dysphagia, unspecified: Secondary | ICD-10-CM | POA: Diagnosis not present

## 2019-06-25 DIAGNOSIS — Z9629 Presence of other otological and audiological implants: Secondary | ICD-10-CM | POA: Diagnosis not present

## 2019-06-30 NOTE — Progress Notes (Signed)
  Patient Name: Melody Ward MRN: [id number] DOB: 20-Jul-1950 Referring Physician: [name] (Profile Not Attached) Date of Service: 06/01/2019 [facility], [place]                                                        End Of Treatment Note  Diagnoses: C32.0-Malignant neoplasm of glottis  Cancer Staging: Cancer Staging Malignant neoplasm of glottis [facility]) Staging form: Larynx - Glottis, AJCC 8th Edition - Pathologic stage from 04/03/2019: Stage III (pT3, pN1, cM0) - Signed by Eppie Gibson, MD on 04/06/2019 - Clinical: No stage assigned - Unsigned   Intent: Curative, post-op  Radiation Treatment Dates: 04/20/2019 through 06/01/2019   Site Technique Total Dose (Gy) Dose per Fx (Gy) Completed Fx Beam Energies  Neck: HN IMRT 60/60 2 30/30 6X   Narrative: The patient tolerated radiation therapy to the laryngeal tumor bed and bilateral neck relatively well.   Plan: The patient will follow-up with radiation oncology in 2 weeks. -----------------------------------  Eppie Gibson, MD

## 2019-07-01 DIAGNOSIS — Z902 Acquired absence of lung [part of]: Secondary | ICD-10-CM | POA: Diagnosis not present

## 2019-07-01 DIAGNOSIS — R1314 Dysphagia, pharyngoesophageal phase: Secondary | ICD-10-CM | POA: Diagnosis not present

## 2019-07-01 DIAGNOSIS — Z9002 Acquired absence of larynx: Secondary | ICD-10-CM | POA: Diagnosis not present

## 2019-07-01 DIAGNOSIS — Z8521 Personal history of malignant neoplasm of larynx: Secondary | ICD-10-CM | POA: Diagnosis not present

## 2019-07-01 DIAGNOSIS — Z08 Encounter for follow-up examination after completed treatment for malignant neoplasm: Secondary | ICD-10-CM | POA: Diagnosis not present

## 2019-07-01 DIAGNOSIS — Z9689 Presence of other specified functional implants: Secondary | ICD-10-CM | POA: Diagnosis not present

## 2019-07-01 DIAGNOSIS — Z923 Personal history of irradiation: Secondary | ICD-10-CM | POA: Diagnosis not present

## 2019-07-01 DIAGNOSIS — K222 Esophageal obstruction: Secondary | ICD-10-CM | POA: Diagnosis not present

## 2019-07-01 DIAGNOSIS — R1310 Dysphagia, unspecified: Secondary | ICD-10-CM | POA: Diagnosis not present

## 2019-07-01 DIAGNOSIS — R1312 Dysphagia, oropharyngeal phase: Secondary | ICD-10-CM | POA: Diagnosis not present

## 2019-07-01 DIAGNOSIS — Z930 Tracheostomy status: Secondary | ICD-10-CM | POA: Diagnosis not present

## 2019-07-02 ENCOUNTER — Ambulatory Visit: Payer: Medicare HMO | Attending: Internal Medicine

## 2019-07-02 ENCOUNTER — Other Ambulatory Visit: Payer: Self-pay

## 2019-07-02 DIAGNOSIS — Z20822 Contact with and (suspected) exposure to covid-19: Secondary | ICD-10-CM | POA: Diagnosis not present

## 2019-07-03 LAB — NOVEL CORONAVIRUS, NAA: SARS-CoV-2, NAA: NOT DETECTED

## 2019-07-07 ENCOUNTER — Other Ambulatory Visit: Payer: Self-pay

## 2019-07-07 ENCOUNTER — Ambulatory Visit (HOSPITAL_COMMUNITY): Payer: Medicare HMO | Admitting: Dentistry

## 2019-07-07 ENCOUNTER — Ambulatory Visit: Payer: Medicare HMO | Attending: Internal Medicine

## 2019-07-07 VITALS — BP 110/77 | HR 62 | Temp 98.6°F | Wt 102.0 lb

## 2019-07-07 DIAGNOSIS — K117 Disturbances of salivary secretion: Secondary | ICD-10-CM

## 2019-07-07 DIAGNOSIS — R432 Parageusia: Secondary | ICD-10-CM

## 2019-07-07 DIAGNOSIS — Z923 Personal history of irradiation: Secondary | ICD-10-CM

## 2019-07-07 DIAGNOSIS — K031 Abrasion of teeth: Secondary | ICD-10-CM

## 2019-07-07 DIAGNOSIS — C329 Malignant neoplasm of larynx, unspecified: Secondary | ICD-10-CM | POA: Diagnosis not present

## 2019-07-07 DIAGNOSIS — Z20822 Contact with and (suspected) exposure to covid-19: Secondary | ICD-10-CM | POA: Diagnosis not present

## 2019-07-07 DIAGNOSIS — R1310 Dysphagia, unspecified: Secondary | ICD-10-CM

## 2019-07-07 NOTE — Progress Notes (Signed)
07/07/2019  Patient Name:   Melody Ward Date of Birth:   07/06/1950 Medical Record Number: [id number]  COVID 19 SCREENING: The patient does not symptoms concerning for COVID-19 infection (Including fever, chills, cough, or new SHORTNESS OF BREATH).    BP 110/77 (BP Location: Left Arm)   Pulse 62   Temp 98.6 F (37 C)   Wt 102 lb (46.3 kg)   BMI 18.07 kg/m   Gregery Na Register presents for oral examination after radiation therapy. Patient has completed 30 radiation treatments from 09/10/2018 through 06/01/19 NO Chemotherapy.  REVIEW OF CHIEF COMPLAINTS: DRY MOUTH: Yes HARD TO SWALLOW: Yes secondary to excess mucus.  HURT TO SWALLOW: No TASTE CHANGES: Taste is slowly returning. SORES IN MOUTH: Patient denies TRISMUS: Patient denies problems with trismus. WEIGHT: 102 pounds down from initial 118 pounds.  HOME OH REGIMEN:  BRUSHING: Twice a day FLOSSING: No flossing patient encouraged to floss to reduce risk for caries. RINSING: No rinsing.  To consider use of Biotene rinses or salt water baking soda rinses. FLUORIDE: Patient using fluoride on on tooth brush at bedtime. TRISMUS EXERCISES:  Maximum interincisal opening: 36 millimeters up from initial 27 mm.  A new trismus device was fabricated today.   DENTAL EXAM:  Oral Hygiene:(PLAQUE): Good oral hygiene. LOCATION OF MUCOSITIS: None noted DESCRIPTION OF SALIVA: Decreased saliva.  Mild xerostomia. ANY EXPOSED BONE: None noted OTHER WATCHED AREAS: Multiple flexure lesions. DX: Xerostomia, Dysgeusia and flexure lesions.  RECOMMENDATIONS: 1. Brush after meals and at bedtime.  Use fluoride at bedtime. 2. Use trismus exercises as directed. 3. Use Biotene Rinse or salt water/baking soda rinses. 4. Multiple sips of water as needed. 5. Return to her primary Dentist in two-three months for follow up evaluation and treatment.   Lenn Cal, DDS

## 2019-07-07 NOTE — Patient Instructions (Addendum)
COVID-19 Education: The signs and symptoms of COVID-19 were discussed with the patient and how to seek care for testing (follow up with PCP or arrange E-visit).   The importance of social distancing was discussed today.  COVID-19 Vaccine Information can be found at: ShippingScam.co.uk For questions related to vaccine distribution or appointments, please email vaccine@[place] .com or call [phone].   RECOMMENDATIONS: 1. Brush after meals and at bedtime.  Use fluoride at bedtime. 2. Use trismus exercises as directed. 3. Use Biotene Rinse or salt water/baking soda rinses. 4. Multiple sips of water as needed. 5. Return to her primary Dentist in two-three months for follow up evaluation and treatment.   Lenn Cal, DDS

## 2019-07-08 ENCOUNTER — Telehealth: Payer: Self-pay

## 2019-07-08 ENCOUNTER — Encounter (HOSPITAL_COMMUNITY): Payer: Self-pay | Admitting: Dentistry

## 2019-07-08 LAB — NOVEL CORONAVIRUS, NAA: SARS-CoV-2, NAA: NOT DETECTED

## 2019-07-08 NOTE — Telephone Encounter (Signed)
Nutrition Follow-up:  Patient with laryngeal cancer.  Has completed radiation treatment (06/01/19).    Spoke with patient via phone for nutrition follow-up.  Patient reports that she is planning to have esophageal dilation next week and hopeful she will be able to swallow better.  Reports that she is eating greek yogurt, grits, eggs with cheese.  Last night ate chicken, ravoili, with cheese and sauce for dinner last night and ice cream.  Reports that she is drinking ensure but not 4 a day.    Medications: reviewed  Labs: reviewed  Anthropometrics:   Weight 102 lb on 3/10 decreased from 106 lb on 06/01/19   NUTRITION DIAGNOSIS: Unintentional weight loss continues   INTERVENTION:  Encouraged patient to increase ensure enlive to 4 per day.  Patient will call RD next week for another complimentary case of ensure.   Encouraged high calorie, high protein foods.   Patient has contact information    MONITORING, EVALUATION, GOAL: Patient will consume adequate calories and protein to maintain weight   NEXT VISIT: as needed  Melody Ward, [place], [place] Registered Dietitian [phone] (pager)

## 2019-07-10 DIAGNOSIS — J3801 Paralysis of vocal cords and larynx, unilateral: Secondary | ICD-10-CM | POA: Diagnosis not present

## 2019-07-10 DIAGNOSIS — R490 Dysphonia: Secondary | ICD-10-CM | POA: Diagnosis not present

## 2019-07-10 DIAGNOSIS — C801 Malignant (primary) neoplasm, unspecified: Secondary | ICD-10-CM | POA: Diagnosis not present

## 2019-07-10 DIAGNOSIS — Z930 Tracheostomy status: Secondary | ICD-10-CM | POA: Diagnosis not present

## 2019-07-10 DIAGNOSIS — J383 Other diseases of vocal cords: Secondary | ICD-10-CM | POA: Diagnosis not present

## 2019-07-14 DIAGNOSIS — Z923 Personal history of irradiation: Secondary | ICD-10-CM | POA: Diagnosis not present

## 2019-07-14 DIAGNOSIS — R1310 Dysphagia, unspecified: Secondary | ICD-10-CM | POA: Diagnosis not present

## 2019-07-14 DIAGNOSIS — K222 Esophageal obstruction: Secondary | ICD-10-CM | POA: Diagnosis not present

## 2019-07-14 DIAGNOSIS — Z85818 Personal history of malignant neoplasm of other sites of lip, oral cavity, and pharynx: Secondary | ICD-10-CM | POA: Diagnosis not present

## 2019-07-17 ENCOUNTER — Encounter: Payer: Self-pay | Admitting: Nutrition

## 2019-07-17 NOTE — Progress Notes (Signed)
Patient was given one complimentary case of ensure enlive.

## 2019-07-31 ENCOUNTER — Other Ambulatory Visit: Payer: Self-pay | Admitting: Family Medicine

## 2019-08-10 DIAGNOSIS — J383 Other diseases of vocal cords: Secondary | ICD-10-CM | POA: Diagnosis not present

## 2019-08-10 DIAGNOSIS — C801 Malignant (primary) neoplasm, unspecified: Secondary | ICD-10-CM | POA: Diagnosis not present

## 2019-08-10 DIAGNOSIS — R490 Dysphonia: Secondary | ICD-10-CM | POA: Diagnosis not present

## 2019-08-10 DIAGNOSIS — Z930 Tracheostomy status: Secondary | ICD-10-CM | POA: Diagnosis not present

## 2019-08-10 DIAGNOSIS — J3801 Paralysis of vocal cords and larynx, unilateral: Secondary | ICD-10-CM | POA: Diagnosis not present

## 2019-08-14 DIAGNOSIS — Z430 Encounter for attention to tracheostomy: Secondary | ICD-10-CM | POA: Diagnosis not present

## 2019-08-19 DIAGNOSIS — C329 Malignant neoplasm of larynx, unspecified: Secondary | ICD-10-CM | POA: Diagnosis not present

## 2019-08-19 DIAGNOSIS — R490 Dysphonia: Secondary | ICD-10-CM | POA: Diagnosis not present

## 2019-08-19 DIAGNOSIS — J383 Other diseases of vocal cords: Secondary | ICD-10-CM | POA: Diagnosis not present

## 2019-08-19 DIAGNOSIS — Z8521 Personal history of malignant neoplasm of larynx: Secondary | ICD-10-CM | POA: Diagnosis not present

## 2019-08-19 DIAGNOSIS — R1312 Dysphagia, oropharyngeal phase: Secondary | ICD-10-CM | POA: Diagnosis not present

## 2019-08-19 DIAGNOSIS — Z9002 Acquired absence of larynx: Secondary | ICD-10-CM | POA: Diagnosis not present

## 2019-08-19 DIAGNOSIS — J3801 Paralysis of vocal cords and larynx, unilateral: Secondary | ICD-10-CM | POA: Diagnosis not present

## 2019-08-19 DIAGNOSIS — K222 Esophageal obstruction: Secondary | ICD-10-CM | POA: Diagnosis not present

## 2019-08-19 DIAGNOSIS — R633 Feeding difficulties: Secondary | ICD-10-CM | POA: Diagnosis not present

## 2019-08-19 DIAGNOSIS — H9202 Otalgia, left ear: Secondary | ICD-10-CM | POA: Diagnosis not present

## 2019-08-19 DIAGNOSIS — R1314 Dysphagia, pharyngoesophageal phase: Secondary | ICD-10-CM | POA: Diagnosis not present

## 2019-08-19 DIAGNOSIS — C801 Malignant (primary) neoplasm, unspecified: Secondary | ICD-10-CM | POA: Diagnosis not present

## 2019-08-20 DIAGNOSIS — Z430 Encounter for attention to tracheostomy: Secondary | ICD-10-CM | POA: Diagnosis not present

## 2019-09-01 ENCOUNTER — Telehealth: Payer: Self-pay | Admitting: Radiation Oncology

## 2019-09-01 ENCOUNTER — Ambulatory Visit: Payer: Self-pay | Admitting: Radiation Oncology

## 2019-09-01 DIAGNOSIS — Z85818 Personal history of malignant neoplasm of other sites of lip, oral cavity, and pharynx: Secondary | ICD-10-CM | POA: Diagnosis not present

## 2019-09-01 DIAGNOSIS — Z9889 Other specified postprocedural states: Secondary | ICD-10-CM | POA: Diagnosis not present

## 2019-09-01 DIAGNOSIS — K222 Esophageal obstruction: Secondary | ICD-10-CM | POA: Diagnosis not present

## 2019-09-01 DIAGNOSIS — R1310 Dysphagia, unspecified: Secondary | ICD-10-CM | POA: Diagnosis not present

## 2019-09-01 DIAGNOSIS — Z923 Personal history of irradiation: Secondary | ICD-10-CM | POA: Diagnosis not present

## 2019-09-02 ENCOUNTER — Other Ambulatory Visit: Payer: Self-pay

## 2019-09-02 ENCOUNTER — Ambulatory Visit (HOSPITAL_COMMUNITY)
Admission: RE | Admit: 2019-09-02 | Discharge: 2019-09-02 | Disposition: A | Discharge status: Home / Self Care | Payer: Medicare HMO | Source: Ambulatory Visit | Attending: Radiation Oncology | Admitting: Radiation Oncology

## 2019-09-02 DIAGNOSIS — C320 Malignant neoplasm of glottis: Secondary | ICD-10-CM | POA: Diagnosis not present

## 2019-09-02 DIAGNOSIS — C760 Malignant neoplasm of head, face and neck: Secondary | ICD-10-CM | POA: Diagnosis not present

## 2019-09-02 LAB — POCT I-STAT CREATININE: Creatinine, Ser: 0.7 mg/dL (ref 0.44–1.00)

## 2019-09-02 MED ORDER — IOHEXOL 300 MG/ML  SOLN
75.0000 mL | Freq: Once | INTRAMUSCULAR | Status: AC | PRN
  Administered 2019-09-02: 75 mL via INTRAVENOUS

## 2019-09-03 NOTE — Progress Notes (Signed)
Melody Ward presents for follow up of radiation completed 06/01/2019 to her neck and to review results from CT scan on 09/02/2019  Pain issues, if any: Her throat is still sore from esophageal stretching last week, but otherwise not complaints Using a feeding tube?: N/A Weight changes, if any:  Wt Readings from Last 3 Encounters:  09/04/19 107 lb (48.5 kg)  07/07/19 102 lb (46.3 kg)  06/19/19 106 lb 8 oz (48.3 kg)   Swallowing issues, if any: Yes. Food and pills continue to get stuck in throat. Patient continues to need esophagus stretched to help with strictures. She states she frequently deals with "backwashing". Smoking or chewing tobacco? None Using fluoride trays daily? Uses fluoride toothpaste prescribed by Dr. Enrique Sack Last ENT visit was on: 08/20/2019 saw Dr. Mila Homer: "RADIOGRAPHIC EVALUATION: MBS today 08/19/19: Narrowing and regurgitation with speech ASSESSMENT:   Dysphagia  History of Head & Neck cancer  Esophageal stricture PLAN: I have discussed the above assessment with patient and/or caregiver. I have discussed the findings of the modified barium swallow with the examining speech pathologist. Speech with allergist feels that patient would likely benefit from another trial of dilation. She has 2 areas of narrowing that are likely impacting swallowing and voicing. I have asked the patient to follow up in 1 month post esophageal dilation"  Other notable issues, if any: 09/01/2019 had Transnasal esophagoscopy with CRE balloon and Maloney dilation under direct visualization   Vitals:   09/04/19 1549  BP: 120/79  Pulse: 66  Resp: 18  Temp: 97.8 F (36.6 C)  SpO2: 100%

## 2019-09-04 ENCOUNTER — Ambulatory Visit
Admission: RE | Admit: 2019-09-04 | Discharge: 2019-09-04 | Disposition: A | Discharge status: Home / Self Care | Payer: Medicare HMO | Source: Ambulatory Visit | Attending: Radiation Oncology | Admitting: Radiation Oncology

## 2019-09-04 ENCOUNTER — Encounter: Payer: Self-pay | Admitting: Radiation Oncology

## 2019-09-04 VITALS — BP 120/79 | HR 66 | Temp 97.8°F | Resp 18 | Ht 63.0 in | Wt 107.0 lb

## 2019-09-04 DIAGNOSIS — C320 Malignant neoplasm of glottis: Secondary | ICD-10-CM | POA: Diagnosis not present

## 2019-09-04 DIAGNOSIS — R1310 Dysphagia, unspecified: Secondary | ICD-10-CM | POA: Insufficient documentation

## 2019-09-04 DIAGNOSIS — Z923 Personal history of irradiation: Secondary | ICD-10-CM | POA: Diagnosis not present

## 2019-09-04 DIAGNOSIS — K222 Esophageal obstruction: Secondary | ICD-10-CM | POA: Insufficient documentation

## 2019-09-04 DIAGNOSIS — Z85819 Personal history of malignant neoplasm of unspecified site of lip, oral cavity, and pharynx: Secondary | ICD-10-CM | POA: Diagnosis not present

## 2019-09-04 DIAGNOSIS — Z79899 Other long term (current) drug therapy: Secondary | ICD-10-CM | POA: Insufficient documentation

## 2019-09-04 DIAGNOSIS — Z08 Encounter for follow-up examination after completed treatment for malignant neoplasm: Secondary | ICD-10-CM | POA: Diagnosis not present

## 2019-09-04 NOTE — Progress Notes (Signed)
Radiation Oncology         [phone] ________________________________  Name: Melody Ward MRN: [id number]  Date: 09/04/2019  DOB: 08/30/1950  Follow-Up Visit Note in person  CC: Luking, Grace Bushy, MD  [name], MD  Diagnosis and Prior Radiotherapy:       ICD-10-CM   1. Malignant neoplasm of glottis ([place])  C32.0     CHIEF COMPLAINT:  Here for follow-up and surveillance of laryngeal cancer  Narrative:  The patient returns today for routine follow-up.  Melody Ward presents for follow up of radiation completed 06/01/19 to her neck.    Melody Ward presents for follow up of radiation completed 06/01/2019 to her neck and to review results from CT scan on 09/02/2019- CT scan was obtained of her neck but not of her chest due to lack of insurance approval she denies any new respiratory complaints.  She has been fully vaccinated from COVID-19  Pain issues, if any: Her throat is still sore from esophageal dilation last week, but otherwise not complaints Using a feeding tube?: N/A Weight changes, if any:  Wt Readings from Last 3 Encounters:  09/04/19 107 lb (48.5 kg)  07/07/19 102 lb (46.3 kg)  06/19/19 106 lb 8 oz (48.3 kg)   Swallowing issues, if any: Yes. Food and pills continue to get stuck in throat. Patient continues to need esophagus stretched to help with strictures. She states she frequently deals with "backwashing". Smoking or chewing tobacco? None Using fluoride trays daily? Uses fluoride toothpaste prescribed by Dr. Enrique Sack Last ENT visit was on: 08/20/2019 saw Dr. Mila Homer: "RADIOGRAPHIC EVALUATION: MBS today 08/19/19: Narrowing and regurgitation with speech ASSESSMENT:   Dysphagia  History of Head & Neck cancer  Esophageal stricture PLAN: I have discussed the above assessment with patient and/or caregiver. I have discussed the findings of the modified barium swallow with the examining speech pathologist. Speech with allergist feels that patient would likely  benefit from another trial of dilation. She has 2 areas of narrowing that are likely impacting swallowing and voicing. I have asked the patient to follow up in 1 month post esophageal dilation"  Other notable issues, if any: 09/01/2019 had Transnasal esophagoscopy with CRE balloon and Maloney dilation under direct visualization    It is unclear when her next speech-language pathology follow-up is -she is unaware  ALLERGIES:  has No Known Allergies.  Meds: Current Outpatient Medications  Medication Sig Dispense Refill  . pantoprazole (PROTONIX) 40 MG tablet Take 40 mg by mouth 2 (two) times daily.    Marland Kitchen acetaminophen (TYLENOL) 500 MG tablet Take by mouth.    . celecoxib (CELEBREX) 200 MG capsule     . dexamethasone (DECADRON) 0.5 MG/5ML solution Take 4mg  BID through 3/1; then take 4mg  daily through 3/5; then take 2mg  daily through 3/9; then take 1mg  daily through 3/13. Stop after that. Take with food. (Patient not taking: Reported on 09/04/2019) 610 mL 0  . lidocaine (XYLOCAINE) 2 % solution Patient: Mix 1part 2% viscous lidocaine, 1part H20. Swish & swallow 79mL of diluted mixture, 79min before meals and at bedtime, up to QID (Patient not taking: Reported on 06/19/2019) 200 mL 3  . oxyCODONE (OXY IR/ROXICODONE) 5 MG immediate release tablet     . polyethylene glycol powder (GLYCOLAX/MIRALAX) 17 GM/SCOOP powder 17 g by Per NG tube route daily.    . sodium fluoride (PREVIDENT 5000 PLUS) 1.1 % CREA dental cream Apply to tooth brush. Brush teeth for 2 minutes. Spit out excess-DO NOT  swallow. Repeat nightly. (Patient not taking: Reported on 09/04/2019) 1 Tube prn  . traMADol (ULTRAM) 50 MG tablet      No current facility-administered medications for this encounter.    Physical Findings: The patient is in no acute distress. Patient is alert and oriented. Wt Readings from Last 3 Encounters:  09/04/19 107 lb (48.5 kg)  07/07/19 102 lb (46.3 kg)  06/19/19 106 lb 8 oz (48.3 kg)    height is 5\' 3"   (1.6 m) and weight is 107 lb (48.5 kg). Her temporal temperature is 97.8 F (36.6 C). Her blood pressure is 120/79 and her pulse is 66. Her respiration is 18 and oxygen saturation is 100%. .  General: Alert and oriented, in no acute distress HEENT: Head is normocephalic. Extraocular movements are intact. Oropharynx is notable for no mucositis or thrush.  Neck: No palpable lymphadenopathy  skin: Skin in treatment fields shows satisfactory healing  Psychiatric: Judgment and insight are intact. Affect is appropriate. Heart: Regular in rate and rhythm with no murmurs Chest clear to auscultation bilaterally Abdomen soft nontender nondistended  Lab Findings: Lab Results  Component Value Date   WBC 7.6 12/21/2006   HGB 12.2 12/21/2006   HCT 35.3 (L) 12/21/2006   MCV 92.8 12/21/2006   PLT 162 12/21/2006    Lab Results  Component Value Date   TSH 1.530 03/29/2016    Radiographic Findings: CT SOFT TISSUE NECK W CONTRAST  Result Date: 09/02/2019 CLINICAL DATA:  Head neck cancer. Assess treatment response. Total laryngectomy 03/03/2019. Partial left pharyngectomy. Neck dissection. Radiation treatment completed 06/21/2019. EXAM: CT NECK WITH CONTRAST TECHNIQUE: Multidetector CT imaging of the neck was performed using the standard protocol following the bolus administration of intravenous contrast. CONTRAST:  22mL OMNIPAQUE IOHEXOL 300 MG/ML  SOLN COMPARISON:  Outside studies of November and December 2020. FINDINGS: Pharynx and larynx: Patient has had total injected me and partial pharyngectomy as noted in the history. I do not see evidence of a residual or recurrent mucosal or submucosal mass lesion. Tracheostomy is in place, apparently with a small extension to the cervical esophagus in the lower neck. Salivary glands: Parotid glands appear normal. Submandibular glands are normal. Thyroid: Right and left lobes appear normal. Isthmus has been resected. Lymph nodes: No sign of recurrent adenopathy on  either side of the neck. Bilateral neck dissections with surgical clips more numerous on the left than the right. Vascular: Mild atherosclerotic disease at the carotid bifurcations without stenosis. Both jugular veins remain and are patent. Limited intracranial: Apparent old skull base fracture on right along the floor of the posterior fossa. Old encephalomalacia in the right cerebellum. Visualized orbits: Normal Mastoids and visualized paranasal sinuses: Clear Skeleton: Ordinary cervical spondylosis. Upper chest: Pleural and parenchymal scarring at the apices. No evidence of metastatic disease to the lung apices. Other: None IMPRESSION: NI-RADS category 1. No CT evidence of residual or recurrent disease. Extensive postsurgical changes as outlined above. Electronically Signed   By: Nelson Chimes M.D.   On: 09/02/2019 13:13    Impression/Plan:    1) Head and Neck Cancer Status: No evidence of disease   2) Nutritional Status: Stable  3) Risk Factors: The patient has been educated about risk factors including alcohol and tobacco abuse; they understand that avoidance of alcohol and tobacco is important to prevent recurrences as well as other cancers; abstaining   4) Swallowing: dysphagia -responsive to dilation; Anderson Malta (patient navigator) will look into whether follow-up as scheduled with speech[facility]language pathology at [facility]  5) Dental: Encouraged to continue regular followup with dentistry, and dental hygiene including fluoride rinses.    6) Thyroid function: check annually; labs are not obtained today (not sure why as they were ordered) so I will look into getting these done when she has her imaging of her chest.  Lab Results  Component Value Date   TSH 1.530 03/29/2016    7) I have sent a message to our financial advocate to look into why insurance approval could not be obtained for the CT of her chest.  We will try to get this scheduled  Otherwise, I will see her back in 6 months.   On  service date, in total, I spent 30 minutes on this encounter. _____________________________________   Eppie Gibson, MD

## 2019-09-07 NOTE — Progress Notes (Signed)
Oncology Nurse Navigator Documentation  I met with Melody Ward during her follow up visit with [name] on 09/04/19. [name] asked me to verify that Melody Ward had a follow up appointment with Speech Therapy at [facility]. I called and spoke to Otolaryngology at [facility] today and they verified that she does have an appointment with Speech Therapy on 10/08/19 at 1:30. She also has an appointment with Dr. Nicolette Bang on 10/13/19 at 1:45. I called Melody Ward today and informed her of these appointments. She voiced her appreciation and knows to call me if she has any further questions or concerns.  Harlow Asa RN, BSN, OCN Head & Neck Oncology Nurse [place] at [facility] Phone # [phone]  Fax # [phone]

## 2019-09-09 ENCOUNTER — Telehealth: Payer: Self-pay | Admitting: *Deleted

## 2019-09-09 DIAGNOSIS — C801 Malignant (primary) neoplasm, unspecified: Secondary | ICD-10-CM | POA: Diagnosis not present

## 2019-09-09 DIAGNOSIS — Z930 Tracheostomy status: Secondary | ICD-10-CM | POA: Diagnosis not present

## 2019-09-09 DIAGNOSIS — J383 Other diseases of vocal cords: Secondary | ICD-10-CM | POA: Diagnosis not present

## 2019-09-09 DIAGNOSIS — J3801 Paralysis of vocal cords and larynx, unilateral: Secondary | ICD-10-CM | POA: Diagnosis not present

## 2019-09-09 DIAGNOSIS — R490 Dysphonia: Secondary | ICD-10-CM | POA: Diagnosis not present

## 2019-09-09 NOTE — Telephone Encounter (Signed)
CALLED PATIENT TO INFORM OF FU APPT. WITH [name] ON 03-15-20 @ 2 PM, SPOKE WITH PATIENT AND SHE IS AWARE OF THIS APPT.

## 2019-09-15 ENCOUNTER — Telehealth: Payer: Self-pay | Admitting: *Deleted

## 2019-09-15 NOTE — Telephone Encounter (Signed)
Called patient to inform of Ct on 10-01-19 - arrival time- 7:45 am @ [facility], patient to have water only- 4 hrs. prior to test, spoke with patient and she is aware of this test

## 2019-09-16 ENCOUNTER — Telehealth: Payer: Self-pay | Admitting: *Deleted

## 2019-09-16 NOTE — Telephone Encounter (Signed)
CALLED PATIENT TO INFORM TO REPORT TO THE LAB @ [facility] ON 10-01-19 @ 7 AM, SPOKE WITH PATIENT AND SHE IS AWARE OF THIS APPT.

## 2019-09-24 DIAGNOSIS — R490 Dysphonia: Secondary | ICD-10-CM | POA: Diagnosis not present

## 2019-09-24 DIAGNOSIS — R1310 Dysphagia, unspecified: Secondary | ICD-10-CM | POA: Diagnosis not present

## 2019-09-25 DIAGNOSIS — Z430 Encounter for attention to tracheostomy: Secondary | ICD-10-CM | POA: Diagnosis not present

## 2019-09-28 ENCOUNTER — Ambulatory Visit (HOSPITAL_COMMUNITY): Payer: Medicare HMO

## 2019-09-30 DIAGNOSIS — Z430 Encounter for attention to tracheostomy: Secondary | ICD-10-CM | POA: Diagnosis not present

## 2019-10-01 ENCOUNTER — Other Ambulatory Visit: Payer: Self-pay

## 2019-10-01 ENCOUNTER — Ambulatory Visit (HOSPITAL_COMMUNITY)
Admission: RE | Admit: 2019-10-01 | Discharge: 2019-10-01 | Disposition: A | Discharge status: Home / Self Care | Payer: Medicare HMO | Source: Ambulatory Visit | Attending: Radiation Oncology | Admitting: Radiation Oncology

## 2019-10-01 DIAGNOSIS — C320 Malignant neoplasm of glottis: Secondary | ICD-10-CM | POA: Insufficient documentation

## 2019-10-01 DIAGNOSIS — C760 Malignant neoplasm of head, face and neck: Secondary | ICD-10-CM | POA: Diagnosis not present

## 2019-10-01 DIAGNOSIS — J439 Emphysema, unspecified: Secondary | ICD-10-CM | POA: Diagnosis not present

## 2019-10-01 MED ORDER — IOHEXOL 300 MG/ML  SOLN
75.0000 mL | Freq: Once | INTRAMUSCULAR | Status: AC | PRN
  Administered 2019-10-01: 75 mL via INTRAVENOUS

## 2019-10-02 ENCOUNTER — Other Ambulatory Visit (HOSPITAL_COMMUNITY)
Admission: RE | Admit: 2019-10-02 | Discharge: 2019-10-02 | Disposition: A | Discharge status: Home / Self Care | Payer: Medicare HMO | Source: Ambulatory Visit | Attending: Radiation Oncology | Admitting: Radiation Oncology

## 2019-10-02 ENCOUNTER — Telehealth: Payer: Self-pay | Admitting: *Deleted

## 2019-10-02 DIAGNOSIS — R634 Abnormal weight loss: Secondary | ICD-10-CM

## 2019-10-02 DIAGNOSIS — C320 Malignant neoplasm of glottis: Secondary | ICD-10-CM

## 2019-10-02 LAB — TSH: TSH: 2.017 u[IU]/mL (ref 0.350–4.500)

## 2019-10-02 LAB — BUN & CREATININE (CHCC)
BUN: 14 mg/dL (ref 8–23)
Creatinine: 0.71 mg/dL (ref 0.44–1.00)
GFR, Est AFR Am: >60 mL/min (ref >60)
GFR, Estimated: >60 mL/min (ref >60)

## 2019-10-02 NOTE — Telephone Encounter (Signed)
Called patient to inform of lab on 10-02-19 @ [facility], spoke with patient and she said that she would go today

## 2019-10-03 ENCOUNTER — Other Ambulatory Visit: Payer: Self-pay | Admitting: Radiation Oncology

## 2019-10-03 DIAGNOSIS — C787 Secondary malignant neoplasm of liver and intrahepatic bile duct: Secondary | ICD-10-CM

## 2019-10-03 NOTE — Progress Notes (Signed)
I informed patient of her CT results; we will order MRI to r/o liver metastases. She is agreeable and appreciated the call.  -----------------------------------  Melody Gibson, MD

## 2019-10-05 ENCOUNTER — Telehealth: Payer: Self-pay | Admitting: *Deleted

## 2019-10-05 NOTE — Telephone Encounter (Signed)
Called patient to inform of MRI for 10-16-19 - arrival time- 6:30 pm @ [facility], patient to be npo- four hours prior to test, lvm for a return call

## 2019-10-07 DIAGNOSIS — R490 Dysphonia: Secondary | ICD-10-CM | POA: Diagnosis not present

## 2019-10-07 DIAGNOSIS — R1310 Dysphagia, unspecified: Secondary | ICD-10-CM | POA: Diagnosis not present

## 2019-10-10 DIAGNOSIS — J383 Other diseases of vocal cords: Secondary | ICD-10-CM | POA: Diagnosis not present

## 2019-10-10 DIAGNOSIS — Z930 Tracheostomy status: Secondary | ICD-10-CM | POA: Diagnosis not present

## 2019-10-10 DIAGNOSIS — J3801 Paralysis of vocal cords and larynx, unilateral: Secondary | ICD-10-CM | POA: Diagnosis not present

## 2019-10-10 DIAGNOSIS — C801 Malignant (primary) neoplasm, unspecified: Secondary | ICD-10-CM | POA: Diagnosis not present

## 2019-10-10 DIAGNOSIS — R490 Dysphonia: Secondary | ICD-10-CM | POA: Diagnosis not present

## 2019-10-13 DIAGNOSIS — Z08 Encounter for follow-up examination after completed treatment for malignant neoplasm: Secondary | ICD-10-CM | POA: Diagnosis not present

## 2019-10-13 DIAGNOSIS — C329 Malignant neoplasm of larynx, unspecified: Secondary | ICD-10-CM | POA: Diagnosis not present

## 2019-10-13 DIAGNOSIS — Z9889 Other specified postprocedural states: Secondary | ICD-10-CM | POA: Diagnosis not present

## 2019-10-13 DIAGNOSIS — Z8529 Personal history of malignant neoplasm of other respiratory and intrathoracic organs: Secondary | ICD-10-CM | POA: Diagnosis not present

## 2019-10-16 ENCOUNTER — Ambulatory Visit (HOSPITAL_COMMUNITY)
Admission: RE | Admit: 2019-10-16 | Discharge: 2019-10-16 | Disposition: A | Discharge status: Home / Self Care | Payer: Medicare HMO | Source: Ambulatory Visit | Attending: Radiation Oncology | Admitting: Radiation Oncology

## 2019-10-16 ENCOUNTER — Other Ambulatory Visit: Payer: Self-pay

## 2019-10-16 DIAGNOSIS — C787 Secondary malignant neoplasm of liver and intrahepatic bile duct: Secondary | ICD-10-CM | POA: Diagnosis present

## 2019-10-16 DIAGNOSIS — K769 Liver disease, unspecified: Secondary | ICD-10-CM | POA: Diagnosis not present

## 2019-10-16 DIAGNOSIS — C760 Malignant neoplasm of head, face and neck: Secondary | ICD-10-CM | POA: Diagnosis not present

## 2019-10-16 MED ORDER — GADOBUTROL 1 MMOL/ML IV SOLN
10.0000 mL | Freq: Once | INTRAVENOUS | Status: AC | PRN
  Administered 2019-10-16: 10 mL via INTRAVENOUS

## 2019-10-28 NOTE — Progress Notes (Signed)
Oncology Nurse Navigator Documentation  I called and spoke with Ms. Thorley today regarding this mornings ENT Tumor Board discussion. I explained that it was decided to repeat her MRI abdomen at the end of October and see [name] for results the next day. She voiced her understanding and knows to call me if she has further questions or concerns.   Harlow Asa RN, BSN, OCN Head & Neck Oncology Nurse [place] [facility] Phone # [phone]  Fax # [phone]

## 2019-11-02 DIAGNOSIS — C329 Malignant neoplasm of larynx, unspecified: Secondary | ICD-10-CM | POA: Diagnosis not present

## 2019-11-02 DIAGNOSIS — M436 Torticollis: Secondary | ICD-10-CM | POA: Diagnosis not present

## 2019-11-02 DIAGNOSIS — I890 Lymphedema, not elsewhere classified: Secondary | ICD-10-CM | POA: Diagnosis not present

## 2019-11-06 DIAGNOSIS — Z430 Encounter for attention to tracheostomy: Secondary | ICD-10-CM | POA: Diagnosis not present

## 2019-11-09 DIAGNOSIS — J3801 Paralysis of vocal cords and larynx, unilateral: Secondary | ICD-10-CM | POA: Diagnosis not present

## 2019-11-09 DIAGNOSIS — M436 Torticollis: Secondary | ICD-10-CM | POA: Diagnosis not present

## 2019-11-09 DIAGNOSIS — Z930 Tracheostomy status: Secondary | ICD-10-CM | POA: Diagnosis not present

## 2019-11-09 DIAGNOSIS — I890 Lymphedema, not elsewhere classified: Secondary | ICD-10-CM | POA: Diagnosis not present

## 2019-11-09 DIAGNOSIS — C801 Malignant (primary) neoplasm, unspecified: Secondary | ICD-10-CM | POA: Diagnosis not present

## 2019-11-09 DIAGNOSIS — C329 Malignant neoplasm of larynx, unspecified: Secondary | ICD-10-CM | POA: Diagnosis not present

## 2019-11-09 DIAGNOSIS — R490 Dysphonia: Secondary | ICD-10-CM | POA: Diagnosis not present

## 2019-11-16 DIAGNOSIS — I890 Lymphedema, not elsewhere classified: Secondary | ICD-10-CM | POA: Diagnosis not present

## 2019-11-16 DIAGNOSIS — C329 Malignant neoplasm of larynx, unspecified: Secondary | ICD-10-CM | POA: Diagnosis not present

## 2019-11-16 DIAGNOSIS — M436 Torticollis: Secondary | ICD-10-CM | POA: Diagnosis not present

## 2019-11-30 DIAGNOSIS — C329 Malignant neoplasm of larynx, unspecified: Secondary | ICD-10-CM | POA: Diagnosis not present

## 2019-11-30 DIAGNOSIS — R29898 Other symptoms and signs involving the musculoskeletal system: Secondary | ICD-10-CM | POA: Diagnosis not present

## 2019-11-30 DIAGNOSIS — I890 Lymphedema, not elsewhere classified: Secondary | ICD-10-CM | POA: Diagnosis not present

## 2019-12-07 DIAGNOSIS — C329 Malignant neoplasm of larynx, unspecified: Secondary | ICD-10-CM | POA: Diagnosis not present

## 2019-12-07 DIAGNOSIS — R29898 Other symptoms and signs involving the musculoskeletal system: Secondary | ICD-10-CM | POA: Diagnosis not present

## 2019-12-07 DIAGNOSIS — I890 Lymphedema, not elsewhere classified: Secondary | ICD-10-CM | POA: Diagnosis not present

## 2019-12-10 DIAGNOSIS — R490 Dysphonia: Secondary | ICD-10-CM | POA: Diagnosis not present

## 2019-12-10 DIAGNOSIS — C801 Malignant (primary) neoplasm, unspecified: Secondary | ICD-10-CM | POA: Diagnosis not present

## 2019-12-10 DIAGNOSIS — J3801 Paralysis of vocal cords and larynx, unilateral: Secondary | ICD-10-CM | POA: Diagnosis not present

## 2019-12-10 DIAGNOSIS — J383 Other diseases of vocal cords: Secondary | ICD-10-CM | POA: Diagnosis not present

## 2019-12-10 DIAGNOSIS — Z930 Tracheostomy status: Secondary | ICD-10-CM | POA: Diagnosis not present

## 2019-12-14 DIAGNOSIS — I890 Lymphedema, not elsewhere classified: Secondary | ICD-10-CM | POA: Diagnosis not present

## 2019-12-14 DIAGNOSIS — C329 Malignant neoplasm of larynx, unspecified: Secondary | ICD-10-CM | POA: Diagnosis not present

## 2019-12-14 DIAGNOSIS — R29898 Other symptoms and signs involving the musculoskeletal system: Secondary | ICD-10-CM | POA: Diagnosis not present

## 2019-12-24 DIAGNOSIS — M436 Torticollis: Secondary | ICD-10-CM | POA: Diagnosis not present

## 2019-12-24 DIAGNOSIS — I890 Lymphedema, not elsewhere classified: Secondary | ICD-10-CM | POA: Diagnosis not present

## 2019-12-24 DIAGNOSIS — C329 Malignant neoplasm of larynx, unspecified: Secondary | ICD-10-CM | POA: Diagnosis not present

## 2020-01-04 DIAGNOSIS — Z430 Encounter for attention to tracheostomy: Secondary | ICD-10-CM | POA: Diagnosis not present

## 2020-01-06 DIAGNOSIS — Z85828 Personal history of other malignant neoplasm of skin: Secondary | ICD-10-CM | POA: Diagnosis not present

## 2020-01-06 DIAGNOSIS — Z85820 Personal history of malignant melanoma of skin: Secondary | ICD-10-CM | POA: Diagnosis not present

## 2020-01-06 DIAGNOSIS — C329 Malignant neoplasm of larynx, unspecified: Secondary | ICD-10-CM | POA: Diagnosis not present

## 2020-01-06 DIAGNOSIS — Z1283 Encounter for screening for malignant neoplasm of skin: Secondary | ICD-10-CM | POA: Diagnosis not present

## 2020-01-06 DIAGNOSIS — I890 Lymphedema, not elsewhere classified: Secondary | ICD-10-CM | POA: Diagnosis not present

## 2020-01-06 DIAGNOSIS — L570 Actinic keratosis: Secondary | ICD-10-CM | POA: Diagnosis not present

## 2020-01-06 DIAGNOSIS — L820 Inflamed seborrheic keratosis: Secondary | ICD-10-CM | POA: Diagnosis not present

## 2020-01-06 DIAGNOSIS — Z08 Encounter for follow-up examination after completed treatment for malignant neoplasm: Secondary | ICD-10-CM | POA: Diagnosis not present

## 2020-01-06 DIAGNOSIS — M436 Torticollis: Secondary | ICD-10-CM | POA: Diagnosis not present

## 2020-01-06 DIAGNOSIS — X32XXXD Exposure to sunlight, subsequent encounter: Secondary | ICD-10-CM | POA: Diagnosis not present

## 2020-01-10 DIAGNOSIS — Z930 Tracheostomy status: Secondary | ICD-10-CM | POA: Diagnosis not present

## 2020-01-10 DIAGNOSIS — J3801 Paralysis of vocal cords and larynx, unilateral: Secondary | ICD-10-CM | POA: Diagnosis not present

## 2020-01-10 DIAGNOSIS — C801 Malignant (primary) neoplasm, unspecified: Secondary | ICD-10-CM | POA: Diagnosis not present

## 2020-01-10 DIAGNOSIS — R490 Dysphonia: Secondary | ICD-10-CM | POA: Diagnosis not present

## 2020-01-11 DIAGNOSIS — I890 Lymphedema, not elsewhere classified: Secondary | ICD-10-CM | POA: Diagnosis not present

## 2020-01-11 DIAGNOSIS — M436 Torticollis: Secondary | ICD-10-CM | POA: Diagnosis not present

## 2020-01-11 DIAGNOSIS — C329 Malignant neoplasm of larynx, unspecified: Secondary | ICD-10-CM | POA: Diagnosis not present

## 2020-01-26 DIAGNOSIS — I890 Lymphedema, not elsewhere classified: Secondary | ICD-10-CM | POA: Diagnosis not present

## 2020-01-26 DIAGNOSIS — M436 Torticollis: Secondary | ICD-10-CM | POA: Diagnosis not present

## 2020-01-26 DIAGNOSIS — C329 Malignant neoplasm of larynx, unspecified: Secondary | ICD-10-CM | POA: Diagnosis not present

## 2020-02-01 DIAGNOSIS — M436 Torticollis: Secondary | ICD-10-CM | POA: Diagnosis not present

## 2020-02-01 DIAGNOSIS — C329 Malignant neoplasm of larynx, unspecified: Secondary | ICD-10-CM | POA: Diagnosis not present

## 2020-02-01 DIAGNOSIS — I890 Lymphedema, not elsewhere classified: Secondary | ICD-10-CM | POA: Diagnosis not present

## 2020-02-08 DIAGNOSIS — I890 Lymphedema, not elsewhere classified: Secondary | ICD-10-CM | POA: Diagnosis not present

## 2020-02-08 DIAGNOSIS — C329 Malignant neoplasm of larynx, unspecified: Secondary | ICD-10-CM | POA: Diagnosis not present

## 2020-02-08 DIAGNOSIS — M436 Torticollis: Secondary | ICD-10-CM | POA: Diagnosis not present

## 2020-02-09 DIAGNOSIS — R490 Dysphonia: Secondary | ICD-10-CM | POA: Diagnosis not present

## 2020-02-09 DIAGNOSIS — J3801 Paralysis of vocal cords and larynx, unilateral: Secondary | ICD-10-CM | POA: Diagnosis not present

## 2020-02-09 DIAGNOSIS — C801 Malignant (primary) neoplasm, unspecified: Secondary | ICD-10-CM | POA: Diagnosis not present

## 2020-02-09 DIAGNOSIS — Z930 Tracheostomy status: Secondary | ICD-10-CM | POA: Diagnosis not present

## 2020-03-11 DIAGNOSIS — C801 Malignant (primary) neoplasm, unspecified: Secondary | ICD-10-CM | POA: Diagnosis not present

## 2020-03-11 DIAGNOSIS — J3801 Paralysis of vocal cords and larynx, unilateral: Secondary | ICD-10-CM | POA: Diagnosis not present

## 2020-03-11 DIAGNOSIS — R490 Dysphonia: Secondary | ICD-10-CM | POA: Diagnosis not present

## 2020-03-11 DIAGNOSIS — Z930 Tracheostomy status: Secondary | ICD-10-CM | POA: Diagnosis not present

## 2020-03-15 ENCOUNTER — Other Ambulatory Visit: Payer: Self-pay

## 2020-03-15 ENCOUNTER — Ambulatory Visit
Admission: RE | Admit: 2020-03-15 | Discharge: 2020-03-15 | Disposition: A | Discharge status: Home / Self Care | Payer: Medicare HMO | Source: Ambulatory Visit | Attending: Radiation Oncology | Admitting: Radiation Oncology

## 2020-03-15 VITALS — BP 132/80 | HR 57 | Temp 97.4°F | Resp 18 | Ht 63.0 in | Wt 112.2 lb

## 2020-03-15 DIAGNOSIS — R4702 Dysphasia: Secondary | ICD-10-CM | POA: Diagnosis not present

## 2020-03-15 DIAGNOSIS — Z08 Encounter for follow-up examination after completed treatment for malignant neoplasm: Secondary | ICD-10-CM | POA: Diagnosis not present

## 2020-03-15 DIAGNOSIS — Z1329 Encounter for screening for other suspected endocrine disorder: Secondary | ICD-10-CM | POA: Insufficient documentation

## 2020-03-15 DIAGNOSIS — C787 Secondary malignant neoplasm of liver and intrahepatic bile duct: Secondary | ICD-10-CM | POA: Diagnosis not present

## 2020-03-15 DIAGNOSIS — R635 Abnormal weight gain: Secondary | ICD-10-CM | POA: Diagnosis not present

## 2020-03-15 DIAGNOSIS — C320 Malignant neoplasm of glottis: Secondary | ICD-10-CM | POA: Diagnosis not present

## 2020-03-15 DIAGNOSIS — Z923 Personal history of irradiation: Secondary | ICD-10-CM | POA: Diagnosis not present

## 2020-03-15 DIAGNOSIS — Z23 Encounter for immunization: Secondary | ICD-10-CM | POA: Insufficient documentation

## 2020-03-15 DIAGNOSIS — Z8521 Personal history of malignant neoplasm of larynx: Secondary | ICD-10-CM | POA: Diagnosis not present

## 2020-03-15 DIAGNOSIS — Z85819 Personal history of malignant neoplasm of unspecified site of lip, oral cavity, and pharynx: Secondary | ICD-10-CM | POA: Diagnosis not present

## 2020-03-15 DIAGNOSIS — R5383 Other fatigue: Secondary | ICD-10-CM | POA: Insufficient documentation

## 2020-03-15 DIAGNOSIS — R5381 Other malaise: Secondary | ICD-10-CM

## 2020-03-15 DIAGNOSIS — C329 Malignant neoplasm of larynx, unspecified: Secondary | ICD-10-CM

## 2020-03-15 DIAGNOSIS — R1310 Dysphagia, unspecified: Secondary | ICD-10-CM | POA: Diagnosis not present

## 2020-03-15 DIAGNOSIS — K7689 Other specified diseases of liver: Secondary | ICD-10-CM | POA: Diagnosis not present

## 2020-03-15 LAB — T4, FREE: Free T4: 0.72 ng/dL (ref 0.61–1.12)

## 2020-03-15 MED ORDER — LARYNGOSCOPY SOLUTION RAD-ONC
15.0000 mL | Freq: Once | TOPICAL | Status: AC
  Administered 2020-03-15: 15 mL via TOPICAL
  Filled 2020-03-15: qty 15

## 2020-03-15 MED ORDER — INFLUENZA VAC A&B SA ADJ QUAD 0.5 ML IM PRSY
0.5000 mL | PREFILLED_SYRINGE | Freq: Once | INTRAMUSCULAR | Status: AC
  Administered 2020-03-15: 0.5 mL via INTRAMUSCULAR
  Filled 2020-03-15: qty 0.5

## 2020-03-15 NOTE — Progress Notes (Signed)
Melody Ward presents for follow up of radiation completed 06/01/2019 to her neck  Pain issues, if any: Patient denies Using a feeding tube?: N/A Weight changes, if any:  Wt Readings from Last 3 Encounters:  03/15/20 112 lb 4 oz (50.9 kg)  09/04/19 107 lb (48.5 kg)  07/07/19 102 lb (46.3 kg)   Swallowing issues, if any: Yes--continues to feel like food gets stuck in her throat (sometimes causing her to vomit). She believes she will need to have another esophageal stretching soon to address.  Smoking or chewing tobacco? None Using fluoride trays daily? Uses fluoride toothpaste prescribed by Dr. Enrique Sack Last ENT visit was on:  10/13/2019 Saw Dr. [name]: "There is no evidence of disease on exam today. She sees [name] in November. F/u in January , certainly sooner if there are problems or questions She is agreeable with this plan."  Other notable issues, if any: Nothing else of note. Overall doing well   Vitals:   03/15/20 1417  BP: 132/80  Pulse: (!) 57  Resp: 18  Temp: (!) 97.4 F (36.3 C)  SpO2: 100%

## 2020-03-16 ENCOUNTER — Encounter: Payer: Self-pay | Admitting: Radiation Oncology

## 2020-03-16 LAB — TSH: TSH: 3.802 u[IU]/mL (ref 0.308–3.960)

## 2020-03-16 NOTE — Progress Notes (Signed)
Radiation Oncology         [phone] ________________________________  Name: Melody Ward MRN: [id number]  Date: 03/15/2020  DOB: 10-27-1950  Follow-Up Visit Note in person  CC: Melody Colla, DO  Melody Ames, MD  Diagnosis and Prior Radiotherapy:       ICD-10-CM   1. Malignant neoplasm of glottis ([place])  C32.0 laryngocopy solution for Rad-Onc    Fiberoptic laryngoscopy  2. 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 Weight gain  R63.5 TSH  4. 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 She believes she will need to have another esophageal stretching soon to address.  Smoking or chewing tobacco? None Using fluoride trays daily? Uses fluoride toothpaste prescribed by Dr. Enrique Ward Last ENT visit was on:  10/13/2019 Saw Dr. Francina Ward: "There is no evidence of disease on exam today. She sees Dr Melody Ward in November. F/u in January , certainly sooner if there are problems or questions She is agreeable with this plan."  Other notable issues, if any: Nothing else of note. Overall doing well   We had meant to schedule an MRI of her abdomen to follow-up some nonspecific lesions in her liver that were favored to be hemangiomas.  This was supposed to be done in October but for unclear reasons it was not.  I am ordering that now.  Vitals:   03/15/20 1417  BP: 132/80  Pulse: (!) 57   Resp: 18  Temp: (!) 97.4 F (36.3 C)  SpO2: 100%   ALLERGIES:  has No Known Allergies.  Meds: Current Outpatient Medications  Medication Sig Dispense Refill  . acetaminophen (TYLENOL) 500 MG tablet Take by mouth. (Patient not taking: Reported on 03/15/2020)    . celecoxib (CELEBREX) 200 MG capsule  (Patient not taking: Reported on 03/15/2020)    . dexamethasone (DECADRON) 0.5 MG/5ML solution Take 4mg  BID through 3/1; then take 4mg  daily through 3/5; then take 2mg  daily through 3/9; then take 1mg  daily through 3/13. Stop after that. Take with food. (Patient not taking: Reported on 09/04/2019) 610 mL 0  . lidocaine (XYLOCAINE) 2 % solution Patient: Mix 1part 2% viscous lidocaine, 1part H20. Swish & swallow 76mL of diluted mixture, 4min before meals and at bedtime, up to QID (Patient not taking: Reported on 06/19/2019) 200 mL 3  . oxyCODONE (OXY IR/ROXICODONE) 5 MG immediate release tablet  (Patient not taking: Reported on 03/15/2020)    . pantoprazole (PROTONIX) 40 MG tablet Take 40 mg by mouth 2 (two) times daily. (Patient not taking: Reported on 03/15/2020)    . polyethylene glycol powder (GLYCOLAX/MIRALAX) 17 GM/SCOOP powder 17 g by Per NG tube route daily. (Patient not taking: Reported on 03/15/2020)    . sodium fluoride (PREVIDENT 5000 PLUS) 1.1 % CREA dental cream Apply to tooth brush. Brush teeth for 2 minutes. Spit out excess-DO NOT  swallow. Repeat nightly. (Patient not taking: Reported on 09/04/2019) 1 Tube prn  . traMADol (ULTRAM) 50 MG tablet  (Patient not taking: Reported on 03/15/2020)     No current facility-administered medications for this encounter.    Physical Findings: The patient is in no acute distress. Patient is alert and oriented. Wt Readings from Last 3 Encounters:  03/15/20 112 lb 4 oz (50.9 kg)  09/04/19 107 lb (48.5 kg)  07/07/19 102 lb (46.3 kg)    height is 5\' 3"  (1.6 m) and weight is 112 lb 4 oz (50.9 kg). Her temporal temperature is 97.4 F (36.3 C)  (abnormal). Her blood pressure is 132/80 and her pulse is 57 (abnormal). Her respiration is 18 and oxygen saturation is 100%. .  General: Alert and oriented, in no acute distress HEENT: Head is normocephalic. Extraocular movements are intact. Oropharynx is notable for no mucositis or thrush. Stoma intact, without any lesion. Neck: No palpable lymphadenopathy  skin: Skin in treatment fields shows satisfactory healing  Psychiatric: Judgment and insight are intact. Affect is appropriate.  PROCEDURE NOTE: After obtaining consent and anesthetizing the nasal cavity with topical lidocaine and phenylephrine, the flexible endoscope was introduced and passed through the nasal cavity.  The nasopharynx, oropharynx, proximal neopharynx/flap were then examined.  No evidence of disease visualized.  She tolerated the procedure well.  Lab Findings: Lab Results  Component Value Date   WBC 7.6 12/21/2006   HGB 12.2 12/21/2006   HCT 35.3 (L) 12/21/2006   MCV 92.8 12/21/2006   PLT 162 12/21/2006    Lab Results  Component Value Date   TSH 3.802 03/15/2020    Radiographic Findings: No results found.  Impression/Plan:    1) Head and Neck Cancer Status: No evidence of disease   She has a nonspecific lesions on her liver that were favored to be hemangiomas and a follow-up MRI was recommended earlier this year.  I will arrange for that to be done next available as she is due for it.    2) Nutritional Status: Stable  3) Risk Factors: The patient has been educated about risk factors including alcohol and tobacco abuse; they understand that avoidance of alcohol and tobacco is important to prevent recurrences as well as other cancers; abstaining   4) Swallowing: dysphagia - responsive to dilation, she is wondering if and when she should repeat this.  She is also wondering if she should remain on pantoprazole BID daily for life.  I recommended she discuss this with otolaryngology at [facility] as they have  conducted these procedures and prescribed that medication for her.  5) Dental: Encouraged to continue regular followup with dentistry, and dental hygiene including fluoride rinses.  She is anticipating a dental cleaning in the near future  6) Thyroid function: check annually; WNL today  Lab Results  Component Value Date   TSH 3.802 03/15/2020   OTHER: I will see her back in 6 months. Follow-up as scheduled with otolaryngology in January.  She has not received her flu vaccine yet.  She would like to do so.  We will arrange for her to get that done at the cancer center today.  On service date, in total, I spent 30 minutes on this encounter.  She was seen in person. _____________________________________   Eppie Gibson, MD

## 2020-04-08 ENCOUNTER — Telehealth: Payer: Self-pay | Admitting: *Deleted

## 2020-04-08 NOTE — Telephone Encounter (Signed)
Called patient to inform of MRI for 04-20-20 - arrival time- 4:30 pm, patient to be NPO- 4 hrs. prior to test, test to be @ [facility], lvm for a return call

## 2020-04-12 ENCOUNTER — Telehealth: Payer: Self-pay | Admitting: *Deleted

## 2020-04-12 NOTE — Telephone Encounter (Signed)
CALLED PATIENT TO INFORM OF MRI, LVM FOR A RETURN CALL 

## 2020-04-13 DIAGNOSIS — Z430 Encounter for attention to tracheostomy: Secondary | ICD-10-CM | POA: Diagnosis not present

## 2020-04-20 ENCOUNTER — Other Ambulatory Visit: Payer: Self-pay

## 2020-04-20 ENCOUNTER — Ambulatory Visit (HOSPITAL_COMMUNITY)
Admission: RE | Admit: 2020-04-20 | Discharge: 2020-04-20 | Disposition: A | Discharge status: Home / Self Care | Payer: Medicare HMO | Source: Ambulatory Visit | Attending: Radiation Oncology | Admitting: Radiation Oncology

## 2020-04-20 DIAGNOSIS — K7689 Other specified diseases of liver: Secondary | ICD-10-CM | POA: Diagnosis not present

## 2020-04-20 DIAGNOSIS — C787 Secondary malignant neoplasm of liver and intrahepatic bile duct: Secondary | ICD-10-CM | POA: Insufficient documentation

## 2020-04-20 DIAGNOSIS — D1809 Hemangioma of other sites: Secondary | ICD-10-CM | POA: Diagnosis not present

## 2020-04-20 DIAGNOSIS — M47814 Spondylosis without myelopathy or radiculopathy, thoracic region: Secondary | ICD-10-CM | POA: Diagnosis not present

## 2020-04-20 DIAGNOSIS — M47816 Spondylosis without myelopathy or radiculopathy, lumbar region: Secondary | ICD-10-CM | POA: Diagnosis not present

## 2020-04-20 MED ORDER — GADOBUTROL 1 MMOL/ML IV SOLN
5.0000 mL | Freq: Once | INTRAVENOUS | Status: AC | PRN
  Administered 2020-04-20: 5 mL via INTRAVENOUS

## 2020-04-27 ENCOUNTER — Ambulatory Visit: Payer: Self-pay | Admitting: Radiation Oncology

## 2020-05-17 DIAGNOSIS — Z9002 Acquired absence of larynx: Secondary | ICD-10-CM | POA: Diagnosis not present

## 2020-05-17 DIAGNOSIS — C329 Malignant neoplasm of larynx, unspecified: Secondary | ICD-10-CM | POA: Diagnosis not present

## 2020-05-18 DIAGNOSIS — Z430 Encounter for attention to tracheostomy: Secondary | ICD-10-CM | POA: Diagnosis not present

## 2020-05-18 DIAGNOSIS — Z438 Encounter for attention to other artificial openings: Secondary | ICD-10-CM | POA: Diagnosis not present

## 2020-05-18 DIAGNOSIS — R490 Dysphonia: Secondary | ICD-10-CM | POA: Diagnosis not present

## 2020-05-18 DIAGNOSIS — Z9002 Acquired absence of larynx: Secondary | ICD-10-CM | POA: Diagnosis not present

## 2020-06-01 DIAGNOSIS — Z963 Presence of artificial larynx: Secondary | ICD-10-CM | POA: Diagnosis not present

## 2020-06-01 DIAGNOSIS — R490 Dysphonia: Secondary | ICD-10-CM | POA: Diagnosis not present

## 2020-07-01 ENCOUNTER — Ambulatory Visit
Admission: EM | Admit: 2020-07-01 | Discharge: 2020-07-01 | Disposition: A | Discharge status: Home / Self Care | Payer: Medicare HMO | Attending: Emergency Medicine | Admitting: Emergency Medicine

## 2020-07-01 ENCOUNTER — Encounter: Payer: Self-pay | Admitting: Emergency Medicine

## 2020-07-01 ENCOUNTER — Ambulatory Visit (INDEPENDENT_AMBULATORY_CARE_PROVIDER_SITE_OTHER): Payer: Medicare HMO

## 2020-07-01 ENCOUNTER — Other Ambulatory Visit: Payer: Self-pay

## 2020-07-01 DIAGNOSIS — M940 Chondrocostal junction syndrome [Tietze]: Secondary | ICD-10-CM | POA: Diagnosis not present

## 2020-07-01 DIAGNOSIS — J069 Acute upper respiratory infection, unspecified: Secondary | ICD-10-CM | POA: Diagnosis not present

## 2020-07-01 DIAGNOSIS — R079 Chest pain, unspecified: Secondary | ICD-10-CM

## 2020-07-01 DIAGNOSIS — J90 Pleural effusion, not elsewhere classified: Secondary | ICD-10-CM

## 2020-07-01 DIAGNOSIS — J9811 Atelectasis: Secondary | ICD-10-CM | POA: Diagnosis not present

## 2020-07-01 MED ORDER — BENZONATATE 100 MG PO CAPS
100.0000 mg | ORAL_CAPSULE | Freq: Three times a day (TID) | ORAL | 0 refills | Status: DC | PRN
Start: 2020-07-01 — End: 2020-08-17

## 2020-07-01 MED ORDER — AMOXICILLIN-POT CLAVULANATE 250-62.5 MG/5ML PO SUSR: 500.0000 mg | Freq: Two times a day (BID) | ORAL | 0 refills | Status: AC

## 2020-07-01 NOTE — Discharge Instructions (Addendum)
Get plenty of rest and push fluids  Tessalon Perles prescribed for cough Augmentin was prescribed/take as directed Use OTC Tylenol/ibuprofen as needed for pain Use medications daily for symptom relief Use OTC medications like ibuprofen or tylenol as needed fever or pain Call or go to the ED if you have any new or worsening symptoms such as fever, worsening cough, shortness of breath, chest tightness, chest pain, turning blue, changes in mental status, etc..Marland Kitchen

## 2020-07-01 NOTE — ED Provider Notes (Signed)
[place]   [id number] 07/01/20 Arrival Time: 1202   CC: URI  SUBJECTIVE: History from: patient.  CATINA NUSS is a 70 y.o. female history of tracheoesophageal prosthesis presents presented to the urgent care with a complaint of right rib cage pain and chest congestion with green mucus for the past few days.  Denies sick exposure to COVID, flu or strep.  Denies recent travel.  Tried OTC medication without relief.  Denies any aggravating factor.  Denies recent aspiration.  Report previous symptoms in the past.   Denies fever, chills, fatigue, sinus pain, rhinorrhea, sore throat, SOB, wheezing, chest pain, nausea, changes in bowel or bladder habits.    ROS: As per HPI.  All other pertinent ROS negative.      Past Medical History:  Diagnosis Date  . melanoma 2   Past Surgical History:  Procedure Laterality Date  . CESAREAN SECTION    . FREE FLAP RADIAL FOREARM  03/06/2019   Dr. Nicolette Bang [facility]  . MODIFIED RADICAL NECK DISSECTION  03/06/2019  . NECK DISSECTION  03/06/2019   bilateral neck dissection  . NERVE GRAFT Left 03/06/2019   left arm lower, Dr. Nicolette Bang at [facility]  . PHARYNGECTOMY  03/06/2019   Dr. Nicolette Bang at [facility]  . TOTAL LARYNGECTOMY  03/06/2019   [facility]. [facility]icolette Bang at [facility]  . 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 Nicolette Bang [facility]  . WRIST SURGERY  06/06/2018   ORIF distal radius fracture   No Known Allergies No current facility-administered medications on file prior to encounter.   Current Outpatient Medications on File Prior to Encounter  Medication Sig Dispense Refill  . acetaminophen (TYLENOL) 500 MG tablet Take by mouth. (Patient not taking: Reported on 03/15/2020)    . celecoxib (CELEBREX) 200 MG capsule  (Patient not taking: Reported on 03/15/2020)    . dexamethasone (DECADRON) 0.5 MG/5ML solution Take 4mg  BID through 3/1; then take 4mg  daily through 3/5; then take 2mg  daily through 3/9; then take 1mg  daily  through 3/13. Stop after that. Take with food. (Patient not taking: Reported on 09/04/2019) 610 mL 0  . lidocaine (XYLOCAINE) 2 % solution Patient: Mix 1part 2% viscous lidocaine, 1part H20. Swish & swallow 15mL of diluted mixture, 41min before meals and at bedtime, up to QID (Patient not taking: Reported on 06/19/2019) 200 mL 3  . oxyCODONE (OXY IR/ROXICODONE) 5 MG immediate release tablet  (Patient not taking: Reported on 03/15/2020)    . pantoprazole (PROTONIX) 40 MG tablet Take 40 mg by mouth 2 (two) times daily. (Patient not taking: Reported on 03/15/2020)    . polyethylene glycol powder (GLYCOLAX/MIRALAX) 17 GM/SCOOP powder 17 g by Per NG tube route daily. (Patient not taking: Reported on 03/15/2020)    . sodium fluoride (PREVIDENT 5000 PLUS) 1.1 % CREA dental cream Apply to tooth brush. Brush teeth for 2 minutes. Spit out excess-DO NOT swallow. Repeat nightly. (Patient not taking: Reported on 09/04/2019) 1 Tube prn  . traMADol (ULTRAM) 50 MG tablet  (Patient not taking: Reported on 03/15/2020)     Social History   Socioeconomic History  . Marital status: Married    Spouse name: Not on file  . Number of children: 1  . Years of education: Not on file  . Highest education level: Not on file  Occupational History  . Not on file  Tobacco Use  . Smoking status: Never Smoker  . Smokeless tobacco: Never Used  Vaping Use  . Vaping Use: Never used  Substance and Sexual Activity  . Alcohol use: Yes    Comment: wine occ  . Drug use: Not Currently  . Sexual activity: Not on file  Other Topics Concern  . Not on file  Social History Narrative  . Not on file   Social Determinants of Health   Financial Resource Strain: Not on file  Food Insecurity: Not on file  Transportation Needs: Not on file  Physical Activity: Not on file  Stress: Not on file  Social Connections: Not on file  Intimate Partner Violence: Not on file   Family History  Problem Relation Age of Onset  . Hypertension  Mother   . Lung cancer Father     OBJECTIVE:  Vitals:   07/01/20 1227  BP: 119/75  Pulse: 71  Resp: 18  Temp: 98.9 F (37.2 C)  TempSrc: Oral  SpO2: 97%     Physical Exam Vitals and nursing note reviewed.  Constitutional:      General: She is not in acute distress.    Appearance: Normal appearance. She is normal weight. She is not ill-appearing, toxic-appearing or diaphoretic.  HENT:     Head: Normocephalic.     Right Ear: Tympanic membrane, ear canal and external ear normal. There is no impacted cerumen.     Left Ear: Tympanic membrane, ear canal and external ear normal. There is no impacted cerumen.     Mouth/Throat:     Mouth: Mucous membranes are moist.     Pharynx: No oropharyngeal exudate.  Cardiovascular:     Rate and Rhythm: Normal rate and regular rhythm.     Pulses: Normal pulses.     Heart sounds: Normal heart sounds. No murmur heard. No friction rub. No gallop.   Pulmonary:     Effort: Pulmonary effort is normal. No respiratory distress.     Breath sounds: Normal breath sounds. No stridor. No wheezing, rhonchi or rales.     Comments: Tracheostomy prosthesis present on examination. Chest:     Chest wall: No tenderness.  Neurological:     Mental Status: She is alert and oriented to person, place, and time.    LABS:  No results found for this or any previous visit (from the past 24 hour(s)).   RADIOLOGY:  DG Chest 2 View  Result Date: 07/01/2020 CLINICAL DATA:  Pain. EXAM: CHEST - 2 VIEW COMPARISON:  None. FINDINGS: The heart size and mediastinal contours are within normal limits. Coronary artery stent. Streaky left basilar opacities. No consolidation. Possible small left pleural effusion. No visible pneumothorax. Prominent bilateral nipple shadows, symmetric. Bilateral shoulder and thoracolumbar degenerative change. Surgical clips in the right neck. IMPRESSION: Streaky left basilar opacities, favor atelectasis. Possible trace left pleural effusion.  Electronically Signed   By: Margaretha Sheffield MD   On: 07/01/2020 13:22   Chest X-ray is positive for left pleural effusion and atelectatisis.  I have reviewed the x-ray myself and the radiologist interpretation.  I am in agreement with the radiologist interpretation.   ASSESSMENT & PLAN:  1. Acute URI   2. Pleural effusion, left   3. Costochondritis     Meds ordered this encounter  Medications  . amoxicillin-clavulanate (AUGMENTIN) 250-62.5 MG/5ML suspension    Sig: Take 10 mLs (500 mg total) by mouth 2 (two) times daily for 7 days.    Dispense:  140 mL    Refill:  0  . benzonatate (TESSALON) 100 MG capsule    Sig: Take 1 capsule (100 mg total) by mouth 3 (  three) times daily as needed for cough.    Dispense:  30 capsule    Refill:  0    Discharge instructions  Get plenty of rest and push fluids  Tessalon Perles prescribed for cough Augmentin was prescribed/take as directed Use OTC Tylenol/ibuprofen as needed for pain Use medications daily for symptom relief Use OTC medications like ibuprofen or tylenol as needed fever or pain Call or go to the ED if you have any new or worsening symptoms such as fever, worsening cough, shortness of breath, chest tightness, chest pain, turning blue, changes in mental status, etc...   Reviewed expectations re: course of current medical issues. Questions answered. Outlined signs and symptoms indicating need for more acute intervention. Patient verbalized understanding. After Visit Summary given.         Emerson Monte, [place] 07/01/20 1345

## 2020-07-01 NOTE — ED Triage Notes (Signed)
Upper back pain that radiates around to right side of chest.  States she has a lot of mucus that is green in color.

## 2020-07-04 ENCOUNTER — Ambulatory Visit: Payer: Medicare HMO | Admitting: Family Medicine

## 2020-07-19 DIAGNOSIS — R490 Dysphonia: Secondary | ICD-10-CM | POA: Diagnosis not present

## 2020-08-01 DIAGNOSIS — H5200 Hypermetropia, unspecified eye: Secondary | ICD-10-CM | POA: Diagnosis not present

## 2020-08-01 DIAGNOSIS — Z0100 Encounter for examination of eyes and vision without abnormal findings: Secondary | ICD-10-CM | POA: Diagnosis not present

## 2020-08-16 ENCOUNTER — Encounter (HOSPITAL_COMMUNITY): Payer: Self-pay | Admitting: Emergency Medicine

## 2020-08-16 ENCOUNTER — Observation Stay (HOSPITAL_COMMUNITY)
Admission: EM | Admit: 2020-08-16 | Discharge: 2020-08-17 | Disposition: A | Discharge status: Home / Self Care | Payer: Medicare HMO | Attending: Internal Medicine | Admitting: Internal Medicine

## 2020-08-16 ENCOUNTER — Emergency Department (HOSPITAL_COMMUNITY): Payer: Medicare HMO

## 2020-08-16 ENCOUNTER — Other Ambulatory Visit: Payer: Self-pay

## 2020-08-16 DIAGNOSIS — Z743 Need for continuous supervision: Secondary | ICD-10-CM | POA: Diagnosis not present

## 2020-08-16 DIAGNOSIS — W1839XA Other fall on same level, initial encounter: Secondary | ICD-10-CM | POA: Diagnosis not present

## 2020-08-16 DIAGNOSIS — M4182 Other forms of scoliosis, cervical region: Secondary | ICD-10-CM | POA: Diagnosis not present

## 2020-08-16 DIAGNOSIS — M4602 Spinal enthesopathy, cervical region: Secondary | ICD-10-CM | POA: Diagnosis not present

## 2020-08-16 DIAGNOSIS — Z8521 Personal history of malignant neoplasm of larynx: Secondary | ICD-10-CM | POA: Diagnosis not present

## 2020-08-16 DIAGNOSIS — S0101XA Laceration without foreign body of scalp, initial encounter: Secondary | ICD-10-CM | POA: Diagnosis not present

## 2020-08-16 DIAGNOSIS — S066X9A Traumatic subarachnoid hemorrhage with loss of consciousness of unspecified duration, initial encounter: Secondary | ICD-10-CM | POA: Diagnosis not present

## 2020-08-16 DIAGNOSIS — Y9239 Other specified sports and athletic area as the place of occurrence of the external cause: Secondary | ICD-10-CM | POA: Insufficient documentation

## 2020-08-16 DIAGNOSIS — S065X9A Traumatic subdural hemorrhage with loss of consciousness of unspecified duration, initial encounter: Secondary | ICD-10-CM | POA: Diagnosis not present

## 2020-08-16 DIAGNOSIS — S0003XA Contusion of scalp, initial encounter: Secondary | ICD-10-CM | POA: Diagnosis not present

## 2020-08-16 DIAGNOSIS — R58 Hemorrhage, not elsewhere classified: Secondary | ICD-10-CM | POA: Diagnosis not present

## 2020-08-16 DIAGNOSIS — S065X0A Traumatic subdural hemorrhage without loss of consciousness, initial encounter: Secondary | ICD-10-CM | POA: Diagnosis not present

## 2020-08-16 DIAGNOSIS — Z20822 Contact with and (suspected) exposure to covid-19: Secondary | ICD-10-CM | POA: Diagnosis not present

## 2020-08-16 DIAGNOSIS — S0990XA Unspecified injury of head, initial encounter: Secondary | ICD-10-CM | POA: Diagnosis not present

## 2020-08-16 DIAGNOSIS — M47812 Spondylosis without myelopathy or radiculopathy, cervical region: Secondary | ICD-10-CM | POA: Diagnosis not present

## 2020-08-16 DIAGNOSIS — S199XXA Unspecified injury of neck, initial encounter: Secondary | ICD-10-CM | POA: Diagnosis not present

## 2020-08-16 DIAGNOSIS — S065XAA Traumatic subdural hemorrhage with loss of consciousness status unknown, initial encounter: Secondary | ICD-10-CM

## 2020-08-16 DIAGNOSIS — H5711 Ocular pain, right eye: Secondary | ICD-10-CM

## 2020-08-16 DIAGNOSIS — R000 Tachycardia, unspecified: Secondary | ICD-10-CM | POA: Diagnosis not present

## 2020-08-16 DIAGNOSIS — I609 Nontraumatic subarachnoid hemorrhage, unspecified: Secondary | ICD-10-CM | POA: Diagnosis not present

## 2020-08-16 DIAGNOSIS — R55 Syncope and collapse: Secondary | ICD-10-CM | POA: Insufficient documentation

## 2020-08-16 DIAGNOSIS — K219 Gastro-esophageal reflux disease without esophagitis: Secondary | ICD-10-CM

## 2020-08-16 DIAGNOSIS — R519 Headache, unspecified: Secondary | ICD-10-CM

## 2020-08-16 DIAGNOSIS — S066X0A Traumatic subarachnoid hemorrhage without loss of consciousness, initial encounter: Secondary | ICD-10-CM | POA: Diagnosis not present

## 2020-08-16 DIAGNOSIS — R42 Dizziness and giddiness: Secondary | ICD-10-CM | POA: Diagnosis not present

## 2020-08-16 LAB — CBC WITH DIFFERENTIAL/PLATELET
Abs Immature Granulocytes: 0.02 10*3/uL (ref 0.00–0.07)
Basophils Absolute: 0 10*3/uL (ref 0.0–0.1)
Basophils Relative: 0 %
Eosinophils Absolute: 0 10*3/uL (ref 0.0–0.5)
Eosinophils Relative: 0 %
HCT: 38.6 % (ref 36.0–46.0)
Hemoglobin: 12.9 g/dL (ref 12.0–15.0)
Immature Granulocytes: 0 %
Lymphocytes Relative: 6 %
Lymphs Abs: 0.6 10*3/uL — ABNORMAL LOW (ref 0.7–4.0)
MCH: 31.9 pg (ref 26.0–34.0)
MCHC: 33.4 g/dL (ref 30.0–36.0)
MCV: 95.3 fL (ref 80.0–100.0)
Monocytes Absolute: 0.3 10*3/uL (ref 0.1–1.0)
Monocytes Relative: 3 %
Neutro Abs: 9.2 10*3/uL — ABNORMAL HIGH (ref 1.7–7.7)
Neutrophils Relative %: 91 %
Platelets: 205 10*3/uL (ref 150–400)
RBC: 4.05 MIL/uL (ref 3.87–5.11)
RDW: 13.8 % (ref 11.5–15.5)
WBC: 10.3 10*3/uL (ref 4.0–10.5)
nRBC: 0 % (ref 0.0–0.2)

## 2020-08-16 LAB — COMPREHENSIVE METABOLIC PANEL
ALT: 18 U/L (ref 0–44)
AST: 27 U/L (ref 15–41)
Albumin: 4.2 g/dL (ref 3.5–5.0)
Alkaline Phosphatase: 82 U/L (ref 38–126)
Anion gap: 11 (ref 5–15)
BUN: 10 mg/dL (ref 8–23)
CO2: 25 mmol/L (ref 22–32)
Calcium: 8.9 mg/dL (ref 8.9–10.3)
Chloride: 101 mmol/L (ref 98–111)
Creatinine, Ser: 0.65 mg/dL (ref 0.44–1.00)
GFR, Estimated: >60 mL/min (ref >60)
Glucose, Bld: 102 mg/dL — ABNORMAL HIGH (ref 70–99)
Potassium: 3.5 mmol/L (ref 3.5–5.1)
Sodium: 137 mmol/L (ref 135–145)
Total Bilirubin: 1.1 mg/dL (ref 0.3–1.2)
Total Protein: 7.4 g/dL (ref 6.5–8.1)

## 2020-08-16 LAB — RESP PANEL BY RT-PCR (FLU A&B, COVID) ARPGX2
Influenza A by PCR: NEGATIVE
Influenza B by PCR: NEGATIVE
SARS Coronavirus 2 by RT PCR: NEGATIVE

## 2020-08-16 MED ORDER — SODIUM CHLORIDE 0.9 % IV BOLUS
1000.0000 mL | Freq: Once | INTRAVENOUS | Status: AC
  Administered 2020-08-16: 1000 mL via INTRAVENOUS

## 2020-08-16 MED ORDER — POVIDONE-IODINE 10 % EX SOLN
CUTANEOUS | Status: AC
  Filled 2020-08-16: qty 30

## 2020-08-16 MED ORDER — MORPHINE SULFATE (PF) 2 MG/ML IV SOLN
2.0000 mg | Freq: Once | INTRAVENOUS | Status: AC
  Administered 2020-08-16: 2 mg via INTRAVENOUS
  Filled 2020-08-16: qty 1

## 2020-08-16 NOTE — ED Triage Notes (Signed)
Pt had unwitnessed syncopal episode at [facility]. Pt large gash to back of head.

## 2020-08-16 NOTE — H&P (Addendum)
History and Physical  Melody Ward CBJ:[id number] DOB: [birth year] DOA: 08/16/2020  Referring physician: Milton Ferguson, MD PCP: Erven Colla, DO  Patient coming from: Home  Chief Complaint: Loss of consciousness  HPI: Melody Ward is a 70 y.o. 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  EMS was activated and patient was taken to the ED for further evaluation and management.  She was reported to have a previous head trauma.  Patient denies biting of tongue, urinary or bowel incontinence.  ED Course: In the emergency department, she was tachypneic, otherwise she was hemodynamically stable.  Work-up in the ED showed normal CBC and BMP.  Influenza A, B and SARS coronavirus 2 was negative. CT cervical spine without contrast showed mild degenerative change in the cervical spine without acute fracture or subluxation CT of head without contrast showed moderate volume right frontal subarachnoid hemorrhage, with a small adjacent subdural hemorrhage.  Small amount of subarachnoid hemorrhage in the right temporal parietal lobe.  Right parietal occipital subdural hematoma measures up to 5 mm in thickness.  Approximately 3 mm right-to-left midline shift.  Left occipital scalp hematoma with skin staples in place.  No acute skull fracture.  Right occipital skull fracture is remote. IV morphine 2 Mg x1 was given, IV hydration of 1 L NS  was provided. Neurosurgery was consulted and recommended admitting patient overnight with repeat of CT scanning in the morning.  Hospitalist was asked to admit patient for further evaluation and management.  Review of Systems: Constitutional: Negative for chills and fever.  HENT: Negative for ear pain and sore throat.   Eyes: Positive for pain behind the right eye.  Negative for visual disturbance.  Respiratory: Negative for cough, chest tightness and shortness of breath.   Cardiovascular: Negative for chest pain and palpitations.  Gastrointestinal: Negative for abdominal pain and vomiting.  Endocrine: Negative for polyphagia and polyuria.  Genitourinary: Negative for decreased urine volume, dysuria Musculoskeletal: Negative for arthralgias and back pain.  Skin: Negative for color change and rash.  Allergic/Immunologic: Negative for immunocompromised state.  Neurological: Positive for headache.  Negative for tremors, syncope, speech difficulty Hematological: Does not bruise/bleed easily.  All other systems reviewed and are negative   Past Medical History:  Diagnosis Date  . melanoma 38   Past Surgical History:  Procedure Laterality Date  . CESAREAN SECTION    . FREE FLAP RADIAL FOREARM  03/06/2019   [facility]  . MODIFIED RADICAL NECK DISSECTION  03/06/2019  . NECK DISSECTION  03/06/2019   bilateral neck dissection  . NERVE GRAFT Left 03/06/2019   left arm lower, Dr. [facility]ico[facility] at [facility]  . PHARYNGECTOMY  03/06/2019   Dr. Nico[facility] Bang at [facility]  . TOTAL LARYNGECTOMY  03/06/2019   Dr[facility] at [facility]  . 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 Nicolette Bang [facility]  . WRIST SURGERY  06/06/2018   ORIF distal radius fracture    Social History:  reports that she has never smoked. She has never used smokeless tobacco. She reports current alcohol use. She reports previous drug use.   No Known Allergies  Family History  Problem Relation Age  of Onset  . Hypertension Mother   . Lung cancer Father      Prior to Admission medications   Medication Sig Start Date End Date Taking? Authorizing Provider  acetaminophen (TYLENOL) 500 MG tablet Take by mouth. 03/13/19  Yes [provider]  benzonatate (TESSALON) 100 MG capsule Take 1 capsule (100 mg total) by mouth 3 (three) times daily as needed for cough. Patient not taking: No sig reported 07/01/20   Emerson Monte, FNP  celecoxib (CELEBREX) 200 MG capsule  03/13/19   [provider]  dexamethasone (DECADRON) 0.5 MG/5ML solution Take 4mg  BID through 3/1; then take 4mg  daily through 3/5; then take 2mg  daily through 3/9; then take 1mg  daily through 3/13. Stop after that. Take with food. Patient not taking: No sig reported 06/19/19   Eppie Gibson, MD  lidocaine (XYLOCAINE) 2 % solution Patient: Mix 1part 2% viscous lidocaine, 1part H20. Swish & swallow 52mL of diluted mixture, 25min before meals and at bedtime, up to QID Patient not taking: No sig reported 05/04/19   Eppie Gibson, MD  oxyCODONE (OXY IR/ROXICODONE) 5 MG immediate release tablet  03/23/19   [provider]  pantoprazole (PROTONIX) 40 MG tablet Take 40 mg by mouth 2 (two) times daily. Patient not taking: No sig reported 09/01/19   [provider]  polyethylene glycol powder (GLYCOLAX/MIRALAX) 17 GM/SCOOP powder 17 g by Per NG tube route daily. Patient not taking: No sig reported 03/13/19   [provider]  sodium fluoride (PREVIDENT 5000 PLUS) 1.1 % CREA dental cream Apply to tooth brush. Brush teeth for 2 minutes. Spit out excess-DO NOT swallow. Repeat nightly. Patient not taking: No sig reported 04/09/19   Lenn Cal, DDS  traMADol Veatrice Bourbon) 50 MG tablet  01/30/19   [provider]    Physical Exam: BP 130/76   Pulse 84   Resp 17   Ht 5\' 3"  (1.6 m)   Wt 51 kg   SpO2 98%   BMI 19.92 kg/m   . General: 70 y.o. year-old female well developed well nourished  in no acute distress.  Alert and oriented x3. Marland Kitchen HEENT: 6 cm occipital laceration status post skin staples, EOMI . Neck:TEP in place, supple . Cardiovascular: Regular rate and rhythm with no rubs or gallops.  No thyromegaly or JVD noted.  No lower extremity edema. 2/4 pulses in all 4 extremities. Marland Kitchen Respiratory: Clear to auscultation with no wheezes or rales. Good inspiratory effort. . Abdomen: Soft nontender nondistended with normal bowel sounds x4 quadrants. . Muskuloskeletal: No cyanosis, clubbing or edema noted bilaterally . Neuro: CN II-XII intact, sensation, reflexes intact . Skin: No ulcerative lesions noted or rashes . Psychiatry: Mood is appropriate for condition and setting          Labs on Admission:  Basic Metabolic Panel: Recent Labs  Lab 08/16/20 2113  NA 137  K 3.5  CL 101  CO2 25  GLUCOSE 102*  BUN 10  CREATININE 0.65  CALCIUM 8.9   Liver Function Tests: Recent Labs  Lab 08/16/20 2113  AST 27  ALT 18  ALKPHOS 82  BILITOT 1.1  PROT 7.4  ALBUMIN 4.2   No results for input(s): LIPASE, AMYLASE in the last 168 hours. No results for input(s): AMMONIA in the last 168  hours. CBC: Recent Labs  Lab 08/16/20 2113  WBC 10.3  NEUTROABS 9.2*  HGB 12.9  HCT 38.6  MCV 95.3  PLT 205   Cardiac Enzymes: No results for input(s): CKTOTAL, CKMB, CKMBINDEX, TROPONINI in the last 168 hours.  BNP (last 3 results) No results for input(s): BNP in the last 8760 hours.  ProBNP (last 3 results) No results for input(s): PROBNP in the last 8760 hours.  CBG: No results for input(s): GLUCAP in the last 168 hours.  Radiological Exams on Admission: CT Head Wo Contrast  Result Date: 08/16/2020 CLINICAL DATA:  Unwitnessed fall at the [facility]. Laceration to back of head. EXAM: CT HEAD WITHOUT CONTRAST TECHNIQUE: Contiguous axial images were obtained from the base of the skull through the vertex without intravenous contrast. COMPARISON:  Remote CT 12/21/2016 FINDINGS:  Brain: Moderate volume right frontal subarachnoid hemorrhage, with a small adjacent subdural hemorrhage component. Small amount of subarachnoid hemorrhage in the right temporoparietal lobe. Right parietooccipital subdural hematoma measures up to 5 mm in thickness. 3 mm right to left midline shift. Bifrontal encephalomalacia site of prior hemorrhage. No evidence of acute ischemia or hydrocephalus. Vascular: Atherosclerosis of skullbase vasculature without hyperdense vessel or abnormal calcification. Skull: Right occipital bone fracture is remote. No acute skull fracture. Sinuses/Orbits: Paranasal sinuses and mastoid air cells are clear. The visualized orbits are unremarkable. Other: Left occipital scalp hematoma with skin staples in place. IMPRESSION: 1. Moderate volume right frontal subarachnoid hemorrhage, with a small adjacent subdural hemorrhage component. 2. Small amount of subarachnoid hemorrhage in the right temporoparietal lobe. Right parietooccipital subdural hematoma measures up to 5 mm in thickness. 3. Approximately 3 mm right to left midline shift. 4. Bifrontal encephalomalacia site of prior hemorrhage. 5. Left occipital scalp hematoma with skin staples in place. 6. No acute skull fracture. Right occipital skull fracture is remote. Critical Value/emergent results were called by telephone at the time of interpretation on 08/16/2020 at 8:39 pm to provider JOSEPH ZAMMIT , who verbally acknowledged these results. Electronically Signed   By: Keith Rake M.D.   On: 08/16/2020 20:39   CT Cervical Spine Wo Contrast  Result Date: 08/16/2020 CLINICAL DATA:  Unwitnessed fall at [facility]. Laceration to back of head. EXAM: CT CERVICAL SPINE WITHOUT CONTRAST TECHNIQUE: Multidetector CT imaging of the cervical spine was performed without intravenous contrast. Multiplanar CT image reconstructions were also generated. COMPARISON:  None. FINDINGS: Alignment: Minimal broad-based levo scoliotic curvature of the  cervical spine. No traumatic malalignment. Skull base and vertebrae: No acute fracture. Vertebral body heights are maintained. The dens and skull base are intact. Soft tissues and spinal canal: No prevertebral fluid or swelling. No visible canal hematoma. Disc levels: Mild endplate spurring at D34-534 and C6-C7 with preservation of disc space. Scattered facet hypertrophy. Upper chest: Postsurgical change in the anterior neck with tracheotomy. Other: No acute or unexpected findings. IMPRESSION: Mild degenerative change in the cervical spine without acute fracture or subluxation. Electronically Signed   By: Keith Rake M.D.   On: 08/16/2020 20:43    EKG: I independently viewed the EKG done and my findings are as followed: Sinus bradycardia at a rate of 59 bpm  Assessment/Plan Present on Admission: **None**  Principal Problem:   Subarachnoid hemorrhage (HCC) Active Problems:   Subdural hematoma (HCC)   Scalp laceration   Headache   Retro-orbital pain of right eye   Syncope   GERD (gastroesophageal reflux disease)   History of malignant neoplasm of glottis   Subarachnoid hemorrhage Subdural  hematoma Neurosurgery was consulted by ED physician and recommended observing patient overnight with repeat CT scan in the morning. CT head without contrast will be done in the morning Patient was admitted to [facility] due to specialist availability considering patient complaining of some right retro-orbital and headache few hours after being in the ED  Scalp laceration s/p skin stapled Scalp bandaged, continue wound care  Headache and right eye retro-orbital pain Improved; Continue Tylenol 650 mg every 6 hours as needed  Reported syncope Patient was reported to have syncope, though patient seemed unaware of this Continue telemetry and watch for arrhythmias Troponin will be checked EKG showed sinus bradycardia at rate of 59 bpm Echocardiogram will be done in the morning to rule out significant aortic  stenosis or other outflow obstruction, and also to evaluate EF and to rule out segmental/Regional wall motion abnormalities. Carotid artery Dopplers will be done in the morning to rule out hemodynamically significant stenosis  GERD Continue Protonix  History of malignant neoplasm of glottis Stable; patient is s/p pharyngectomy and total laryngectomy with pharyngoplasty with TEP placement  DVT prophylaxis: SCDs  Code Status: Full code  Family Communication: None at bedside  Disposition Plan:  Patient is from:                        home Anticipated DC to:                   home Anticipated DC date:               1 day Anticipated DC barriers:           Patient requires inpatient management for subarachnoid hemorrhage and subdural hematoma with pending repeat CT of head   Consults called: Neurosurgery  Admission status: Observation    Bernadette Hoit MD [facility]  08/17/2020, 1:00 AM

## 2020-08-16 NOTE — ED Notes (Signed)
Patient transported to CT 

## 2020-08-16 NOTE — ED Provider Notes (Addendum)
[facility] EMERGENCY DEPARTMENT Provider Note   CSN: [id number] Arrival date & time: 08/16/20  1841     History Chief Complaint  Patient presents with  . Loss of Consciousness    Melody Ward is a 70 y.o. female.  Patient was running on the treadmill and then went into the bathroom and she passed out and hit her head.  Patient is alert now.  Patient has a history of previous head trauma  The history is provided by the patient and medical records. No language interpreter was used.  Loss of Consciousness Episode history:  Single Most recent episode:  Today Timing:  Rare Progression:  Resolved Chronicity:  New Context: not blood draw   Witnessed: no   Relieved by:  Nothing Associated symptoms: headaches   Associated symptoms: no chest pain and no seizures        Past Medical History:  Diagnosis Date  . melanoma 1990    Patient Active Problem List   Diagnosis Date Noted  . Subarachnoid hemorrhage ([place]) 08/16/2020  . 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 Osteopenia 04/17/2016  . Sun-damaged skin 03/31/2016    Past Surgical History:  Procedure Laterality Date  . CESAREAN SECTION    . FREE FLAP RADIAL FOREARM  03/06/2019   Dr. Nicolette Bang [facility]  . MODIFIED RADICAL NECK DISSECTION  03/06/2019  . NECK DISSECTION  03/06/2019   bilateral neck dissection  . NERVE GRAFT Left 03/06/2019   left arm lower, Dr. Nicolette Bang at [facility]  . PHARYNGECTOMY  03/06/2019   Dr. Nicolette Bang at [facility]  . TOTAL LARYNGECTOMY  03/06/2019   Dr. Nicolette Bang at [facility]  . 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 Nicolette Bang [facility]  . WRIST SURGERY  06/06/2018   ORIF distal radius fracture     OB History    Gravida      Para      Term      Preterm      AB      Living  1     SAB      IAB      Ectopic      Multiple      Live Births              Family History  Problem Relation Age of Onset  . Hypertension Mother   . Lung  cancer Father     Social History   Tobacco Use  . Smoking status: Never Smoker  . Smokeless tobacco: Never Used  Vaping Use  . Vaping Use: Never used  Substance Use Topics  . Alcohol use: Yes    Comment: wine occ  . Drug use: Not Currently    Home Medications Prior to Admission medications   Medication Sig Start Date End Date Taking? Authorizing Provider  acetaminophen (TYLENOL) 500 MG tablet Take by mouth. 03/13/19  Yes [provider]  benzonatate (TESSALON) 100 MG capsule Take 1 capsule (100 mg total) by mouth 3 (three) times daily as needed for cough. Patient not taking: No sig reported 07/01/20   Emerson Monte, FNP  celecoxib (CELEBREX) 200 MG capsule  03/13/19   [provider]  dexamethasone (DECADRON) 0.5 MG/5ML solution Take 4mg  BID through 3/1; then take 4mg  daily through 3/5; then take 2mg  daily through 3/9; then take 1mg  daily through 3/13. Stop after that. Take with food. Patient not taking: No sig reported 06/19/19   Eppie Gibson, MD  lidocaine (XYLOCAINE) 2 %  solution Patient: Mix 1part 2% viscous lidocaine, 1part H20. Swish & swallow 15mL of diluted mixture, 13min before meals and at bedtime, up to QID Patient not taking: No sig reported 05/04/19   Eppie Gibson, MD  oxyCODONE (OXY IR/ROXICODONE) 5 MG immediate release tablet  03/23/19   [provider]  pantoprazole (PROTONIX) 40 MG tablet Take 40 mg by mouth 2 (two) times daily. Patient not taking: No sig reported 09/01/19   [provider]  polyethylene glycol powder (GLYCOLAX/MIRALAX) 17 GM/SCOOP powder 17 g by Per NG tube route daily. Patient not taking: No sig reported 03/13/19   [provider]  sodium fluoride (PREVIDENT 5000 PLUS) 1.1 % CREA dental cream Apply to tooth brush. Brush teeth for 2 minutes. Spit out excess-DO NOT swallow. Repeat nightly. Patient not taking: No sig reported 04/09/19   Lenn Cal, DDS  traMADol Veatrice Bourbon) 50 MG tablet  01/30/19    [provider]    Allergies    Patient has no known allergies.  Review of Systems   Review of Systems  Constitutional: Negative for appetite change and fatigue.  HENT: Negative for congestion, ear discharge and sinus pressure.   Eyes: Negative for discharge.  Respiratory: Negative for cough.   Cardiovascular: Positive for syncope. Negative for chest pain.  Gastrointestinal: Negative for abdominal pain and diarrhea.  Genitourinary: Negative for frequency and hematuria.  Musculoskeletal: Negative for back pain.  Skin: Negative for rash.  Neurological: Positive for headaches. Negative for seizures.  Psychiatric/Behavioral: Negative for hallucinations.    Physical Exam Updated Vital Signs BP 136/83   Pulse 90   Resp (!) 24   Ht 5\' 3"  (1.6 m)   Wt 51 kg   SpO2 100%   BMI 19.92 kg/m   Physical Exam Vitals and nursing note reviewed.  Constitutional:      Appearance: She is well-developed.  HENT:     Head: Normocephalic.     Comments: Patient has a 6 cm laceration to the occipital head    Nose: Nose normal.  Eyes:     General: No scleral icterus.    Conjunctiva/sclera: Conjunctivae normal.  Neck:     Thyroid: No thyromegaly.  Cardiovascular:     Rate and Rhythm: Normal rate and regular rhythm.     Heart sounds: No murmur heard. No friction rub. No gallop.   Pulmonary:     Breath sounds: No stridor. No wheezing or rales.  Chest:     Chest wall: No tenderness.  Abdominal:     General: There is no distension.     Tenderness: There is no abdominal tenderness. There is no rebound.  Musculoskeletal:        General: Normal range of motion.     Cervical back: Neck supple.  Lymphadenopathy:     Cervical: No cervical adenopathy.  Skin:    Findings: No erythema or rash.  Neurological:     Mental Status: She is alert and oriented to person, place, and time.     Motor: No abnormal muscle tone.     Coordination: Coordination normal.  Psychiatric:         Behavior: Behavior normal.     ED Results / Procedures / Treatments   Labs (all labs ordered are listed, but only abnormal results are displayed) Labs Reviewed  CBC WITH DIFFERENTIAL/PLATELET - Abnormal; Notable for the following components:      Result Value   Neutro Abs 9.2 (*)    Lymphs Abs 0.6 (*)  All other components within normal limits  COMPREHENSIVE METABOLIC PANEL - Abnormal; Notable for the following components:   Glucose, Bld 102 (*)    All other components within normal limits  RESP PANEL BY RT-PCR (FLU A&B, COVID) [id number]    EKG EKG Interpretation  Date/Time:  Tuesday August 16 2020 19:07:49 EDT Ventricular Rate:  59 PR Interval:  164 QRS Duration: 83 QT Interval:  481 QTC Calculation: 477 R Axis:   48 Text Interpretation: Sinus arrhythmia Confirmed by Milton Ferguson [id number]) on 08/16/2020 9:33:26 PM   Radiology CT Head Wo Contrast  Result Date: 08/16/2020 CLINICAL DATA:  Unwitnessed fall at the [facility]. Laceration to back of head. EXAM: CT HEAD WITHOUT CONTRAST TECHNIQUE: Contiguous axial images were obtained from the base of the skull through the vertex without intravenous contrast. COMPARISON:  Remote CT 12/21/2016 FINDINGS: Brain: Moderate volume right frontal subarachnoid hemorrhage, with a small adjacent subdural hemorrhage component. Small amount of subarachnoid hemorrhage in the right temporoparietal lobe. Right parietooccipital subdural hematoma measures up to 5 mm in thickness. 3 mm right to left midline shift. Bifrontal encephalomalacia site of prior hemorrhage. No evidence of acute ischemia or hydrocephalus. Vascular: Atherosclerosis of skullbase vasculature without hyperdense vessel or abnormal calcification. Skull: Right occipital bone fracture is remote. No acute skull fracture. Sinuses/Orbits: Paranasal sinuses and mastoid air cells are clear. The visualized orbits are unremarkable. Other: Left occipital scalp hematoma with skin staples in place.  IMPRESSION: 1. Moderate volume right frontal subarachnoid hemorrhage, with a small adjacent subdural hemorrhage component. 2. Small amount of subarachnoid hemorrhage in the right temporoparietal lobe. Right parietooccipital subdural hematoma measures up to 5 mm in thickness. 3. Approximately 3 mm right to left midline shift. 4. Bifrontal encephalomalacia site of prior hemorrhage. 5. Left occipital scalp hematoma with skin staples in place. 6. No acute skull fracture. Right occipital skull fracture is remote. Critical Value/emergent results were called by telephone at the time of interpretation on 08/16/2020 at 8:39 pm to provider Kimberlynn Lumbra , who verbally acknowledged these results. Electronically Signed   By: Keith Rake M.D.   On: 08/16/2020 20:39   CT Cervical Spine Wo Contrast  Result Date: 08/16/2020 CLINICAL DATA:  Unwitnessed fall at [facility]. Laceration to back of head. EXAM: CT CERVICAL SPINE WITHOUT CONTRAST TECHNIQUE: Multidetector CT imaging of the cervical spine was performed without intravenous contrast. Multiplanar CT image reconstructions were also generated. COMPARISON:  None. FINDINGS: Alignment: Minimal broad-based levo scoliotic curvature of the cervical spine. No traumatic malalignment. Skull base and vertebrae: No acute fracture. Vertebral body heights are maintained. The dens and skull base are intact. Soft tissues and spinal canal: No prevertebral fluid or swelling. No visible canal hematoma. Disc levels: Mild endplate spurring at D34-534 and C6-C7 with preservation of disc space. Scattered facet hypertrophy. Upper chest: Postsurgical change in the anterior neck with tracheotomy. Other: No acute or unexpected findings. IMPRESSION: Mild degenerative change in the cervical spine without acute fracture or subluxation. Electronically Signed   By: Keith Rake M.D.   On: 08/16/2020 20:43    Procedures .Marland KitchenLaceration Repair  Date/Time: 08/16/2020 10:47 PM Performed by: Milton Ferguson, MD Authorized by: Milton Ferguson, MD   Comments:     Patient with a 6 cm laceration to the occipital head.  Area was cleaned thoroughly with Betadine.  Clots were removed.  Patient had 10 staples used to close the laceration.  The patient tolerated the procedure well she did not get anything for anesthesia of the skin  Medications Ordered in ED Medications  povidone-iodine (BETADINE) 10 % external solution (  Given 08/16/20 1948)  sodium chloride 0.9 % bolus 1,000 mL (1,000 mLs Intravenous New Bag/Given 08/16/20 2124)  morphine 2 MG/ML injection 2 mg (2 mg Intravenous Given 08/16/20 2206)    ED Course  I have reviewed the triage vital signs and the nursing notes.  Pertinent labs & imaging results that were available during my care of the patient were reviewed by me and considered in my medical decision making (see chart for details). CRITICAL CARE Performed by: Milton Ferguson Total critical care time: 45 minutes Critical care time was exclusive of separately billable procedures and treating other patients. Critical care was necessary to treat or prevent imminent or life-threatening deterioration. Critical care was time spent personally by me on the following activities: development of treatment plan with patient and/or surrogate as well as nursing, discussions with consultants, evaluation of patient's response to treatment, examination of patient, obtaining history from patient or surrogate, ordering and performing treatments and interventions, ordering and review of laboratory studies, ordering and review of radiographic studies, pulse oximetry and re-evaluation of patient's condition.   Patient has CT scan that showed a subarachnoid hemorrhage and subdural hemorrhage.  I consulted neurosurgery and spoke to the neurosurgery PA Reinaldo Meeker and she stated the patient needed to be admitted observed and have a repeat CT scan in the morning.  She stated the patient can be admitted at any  [facility] but she did not have to go to [facility].  I spoke to the hospitalist who understands that. MDM Rules/Calculators/A&P                          Patient with a fall and head injury with laceration to occipital area closed with staples.  She also has a subarachnoid and subdural hematoma she will be admitted observed and get a repeat scan tomorrow Final Clinical Impression(s) / ED Diagnoses Final diagnoses:  Traumatic injury of head, initial encounter    Rx / DC Orders ED Discharge Orders    None       Milton Ferguson, MD 08/16/20 2249    Milton Ferguson, MD 08/16/20 2257

## 2020-08-16 NOTE — Progress Notes (Addendum)
Patient ID: Melody Ward, female   DOB: 1951/03/30, 70 y.o.   MRN: [id number] Imaging reviewed right frontal contusion traumatic subarachnoid hemorrhage skim subdural on the right may be evidence of small amount of left frontal contusion.  Reportedly the patient is neurologically intact and nonfocal from unwitnessed fall at the [facility].  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  This is nonsurgical recommend medicine admit follow-up CT in the morning if stable and patient neurologically intact could consider discharge with close follow-up if headaches and nausea controlled.  But would base that determination on appearance of follow-up CT scans.  I think it is okay to admit at [facility] if okay with medicine otherwise patient can be transferred to medicine at [facility] and observed here.  Patient should be admitted to either stepdown or ICU for the first night observation.  If patient transferred to [facility] recommend 4 N. ICU or 4 N. progressive.

## 2020-08-17 ENCOUNTER — Observation Stay (HOSPITAL_BASED_OUTPATIENT_CLINIC_OR_DEPARTMENT_OTHER): Payer: Medicare HMO

## 2020-08-17 ENCOUNTER — Observation Stay (HOSPITAL_COMMUNITY): Payer: Medicare HMO

## 2020-08-17 DIAGNOSIS — W1839XA Other fall on same level, initial encounter: Secondary | ICD-10-CM | POA: Diagnosis not present

## 2020-08-17 DIAGNOSIS — I340 Nonrheumatic mitral (valve) insufficiency: Secondary | ICD-10-CM

## 2020-08-17 DIAGNOSIS — R55 Syncope and collapse: Secondary | ICD-10-CM

## 2020-08-17 DIAGNOSIS — I609 Nontraumatic subarachnoid hemorrhage, unspecified: Secondary | ICD-10-CM | POA: Diagnosis not present

## 2020-08-17 DIAGNOSIS — S0990XA Unspecified injury of head, initial encounter: Secondary | ICD-10-CM | POA: Diagnosis not present

## 2020-08-17 DIAGNOSIS — Z8521 Personal history of malignant neoplasm of larynx: Secondary | ICD-10-CM | POA: Diagnosis not present

## 2020-08-17 DIAGNOSIS — S0101XA Laceration without foreign body of scalp, initial encounter: Secondary | ICD-10-CM

## 2020-08-17 DIAGNOSIS — S065X9A Traumatic subdural hemorrhage with loss of consciousness of unspecified duration, initial encounter: Secondary | ICD-10-CM | POA: Diagnosis not present

## 2020-08-17 DIAGNOSIS — G9389 Other specified disorders of brain: Secondary | ICD-10-CM | POA: Diagnosis not present

## 2020-08-17 DIAGNOSIS — S065XAA Traumatic subdural hemorrhage with loss of consciousness status unknown, initial encounter: Secondary | ICD-10-CM

## 2020-08-17 DIAGNOSIS — S066X9A Traumatic subarachnoid hemorrhage with loss of consciousness of unspecified duration, initial encounter: Secondary | ICD-10-CM | POA: Diagnosis not present

## 2020-08-17 DIAGNOSIS — S0083XA Contusion of other part of head, initial encounter: Secondary | ICD-10-CM | POA: Diagnosis not present

## 2020-08-17 DIAGNOSIS — Y9239 Other specified sports and athletic area as the place of occurrence of the external cause: Secondary | ICD-10-CM | POA: Diagnosis not present

## 2020-08-17 DIAGNOSIS — S066X0A Traumatic subarachnoid hemorrhage without loss of consciousness, initial encounter: Secondary | ICD-10-CM | POA: Diagnosis not present

## 2020-08-17 DIAGNOSIS — Z20822 Contact with and (suspected) exposure to covid-19: Secondary | ICD-10-CM | POA: Diagnosis not present

## 2020-08-17 DIAGNOSIS — K219 Gastro-esophageal reflux disease without esophagitis: Secondary | ICD-10-CM

## 2020-08-17 DIAGNOSIS — H5711 Ocular pain, right eye: Secondary | ICD-10-CM

## 2020-08-17 DIAGNOSIS — R519 Headache, unspecified: Secondary | ICD-10-CM

## 2020-08-17 DIAGNOSIS — S065X0A Traumatic subdural hemorrhage without loss of consciousness, initial encounter: Secondary | ICD-10-CM | POA: Diagnosis not present

## 2020-08-17 LAB — COMPREHENSIVE METABOLIC PANEL
ALT: 16 U/L (ref 0–44)
AST: 25 U/L (ref 15–41)
Albumin: 3.7 g/dL (ref 3.5–5.0)
Alkaline Phosphatase: 75 U/L (ref 38–126)
Anion gap: 10 (ref 5–15)
BUN: 9 mg/dL (ref 8–23)
CO2: 21 mmol/L — ABNORMAL LOW (ref 22–32)
Calcium: 8.2 mg/dL — ABNORMAL LOW (ref 8.9–10.3)
Chloride: 104 mmol/L (ref 98–111)
Creatinine, Ser: 0.57 mg/dL (ref 0.44–1.00)
GFR, Estimated: >60 mL/min (ref >60)
Glucose, Bld: 125 mg/dL — ABNORMAL HIGH (ref 70–99)
Potassium: 3.7 mmol/L (ref 3.5–5.1)
Sodium: 135 mmol/L (ref 135–145)
Total Bilirubin: 1.4 mg/dL — ABNORMAL HIGH (ref 0.3–1.2)
Total Protein: 6.6 g/dL (ref 6.5–8.1)

## 2020-08-17 LAB — APTT: aPTT: 26 seconds (ref 24–36)

## 2020-08-17 LAB — CBC
HCT: 37 % (ref 36.0–46.0)
Hemoglobin: 12.3 g/dL (ref 12.0–15.0)
MCH: 31.8 pg (ref 26.0–34.0)
MCHC: 33.2 g/dL (ref 30.0–36.0)
MCV: 95.6 fL (ref 80.0–100.0)
Platelets: 192 10*3/uL (ref 150–400)
RBC: 3.87 MIL/uL (ref 3.87–5.11)
RDW: 13.9 % (ref 11.5–15.5)
WBC: 8.4 10*3/uL (ref 4.0–10.5)
nRBC: 0 % (ref 0.0–0.2)

## 2020-08-17 LAB — ECHOCARDIOGRAM COMPLETE
AR max vel: 2.38 cm2
AV Area VTI: 2.07 cm2
AV Area mean vel: 2.14 cm2
AV Mean grad: 4 mmHg
AV Peak grad: 7.5 mmHg
Ao pk vel: 1.37 m/s
Area-P 1/2: 3.31 cm2
Calc EF: 65.3 %
Height: 63 in
S' Lateral: 2.65 cm
Single Plane A2C EF: 66.4 %
Single Plane A4C EF: 66.2 %
Weight: 1798.95 oz

## 2020-08-17 LAB — PROTIME-INR
INR: 0.9 (ref 0.8–1.2)
Prothrombin Time: 12.6 seconds (ref 11.4–15.2)

## 2020-08-17 LAB — MAGNESIUM: Magnesium: 2.1 mg/dL (ref 1.7–2.4)

## 2020-08-17 LAB — PHOSPHORUS: Phosphorus: 4 mg/dL (ref 2.5–4.6)

## 2020-08-17 LAB — HIV ANTIBODY (ROUTINE TESTING W REFLEX): HIV Screen 4th Generation wRfx: NONREACTIVE

## 2020-08-17 LAB — TROPONIN I (HIGH SENSITIVITY)
Troponin I (High Sensitivity): 3 ng/L (ref <18)
Troponin I (High Sensitivity): 3 ng/L (ref <18)

## 2020-08-17 MED ORDER — ACETAMINOPHEN 325 MG PO TABS: 650.0000 mg | ORAL_TABLET | Freq: Four times a day (QID) | ORAL | Status: DC | PRN

## 2020-08-17 NOTE — ED Notes (Signed)
Pt took contacts out, placed in contact case and in bag at bedside.

## 2020-08-17 NOTE — Progress Notes (Incomplete)
  Echocardiogram 2D Echocardiogram has been performed.  Cammy Brochure 08/17/2020, 9:34 AM

## 2020-08-17 NOTE — Discharge Instructions (Signed)
Syncope Syncope is when you pass out (faint) for a short time. It is caused by a sudden decrease in blood flow to the brain. Signs that you may be about to pass out include:  Feeling dizzy or light-headed.  Feeling sick to your stomach (nauseous).  Seeing all white or all black.  Having cold, clammy skin. If you pass out, get help right away. Call your local emergency services (911 in the [place] Do not drive yourself to the hospital. Follow these instructions at home: Watch for any changes in your symptoms. Take these actions to stay safe and help with your symptoms: Lifestyle  Do not drive, use machinery, or play sports until your doctor says it is okay.  Do not drink alcohol.  Do not use any products that contain nicotine or tobacco, such as cigarettes and e-cigarettes. If you need help quitting, ask your doctor.  Drink enough fluid to keep your pee (urine) pale yellow. General instructions  Take over-the-counter and prescription medicines only as told by your doctor.  If you are taking blood pressure or heart medicine, sit up and stand up slowly. Spend a few minutes getting ready to sit and then stand. This can help you feel less dizzy.  Have someone stay with you until you feel stable.  If you start to feel like you might pass out, lie down right away and raise (elevate) your feet above the level of your heart. Breathe deeply and steadily. Wait until all of the symptoms are gone.  Keep all follow-up visits as told by your doctor. This is important. Get help right away if:  You have a very bad headache.  You pass out once or more than once.  You have pain in your chest, belly, or back.  You have a very fast or uneven heartbeat (palpitations).  It hurts to breathe.  You are bleeding from your mouth or your bottom (rectum).  You have black or tarry poop (stool).  You have jerky movements that you cannot control (seizure).  You are confused.  You have trouble  walking.  You are very weak.  You have vision problems. These symptoms may be an emergency. Do not wait to see if the symptoms will go away. Get medical help right away. Call your local emergency services (911 in the [place] Do not drive yourself to the hospital. Summary  Syncope is when you pass out (faint) for a short time. It is caused by a sudden decrease in blood flow to the brain.  Signs that you may be about to faint include feeling dizzy, light-headed, or sick to your stomach, seeing all white or all black, or having cold, clammy skin.  If you start to feel like you might pass out, lie down right away and raise (elevate) your feet above the level of your heart. Breathe deeply and steadily. Wait until all of the symptoms are gone. This information is not intended to replace advice given to you by your health care provider. Make sure you discuss any questions you have with your health care provider. Document Revised: 05/20/2019 Document Reviewed: 05/22/2017 Elsevier Patient Education  2021 [employer] Inc[employer]  

## 2020-08-17 NOTE — Progress Notes (Signed)
Patient ID: Melody Ward, female   DOB: 1950/11/28, 70 y.o.   MRN: [id number] Repeat head CT stable to slightly improved when the primary medical team is comfortable with diagnosis and etiology of the syncopal episode she can be discharged home with scheduled follow-up with [place] in 1 to 2 weeks.

## 2020-08-17 NOTE — ED Notes (Signed)
Pt husband given update.

## 2020-08-17 NOTE — Consult Note (Signed)
Reason for Consult: [facility], SDH Referring Physician: EDP  BREANNA SHORKEY is an 70 y.o. female.   HPI:  70 year old female presented to [facility] ED last night after having a syncopal episode at the [facility]. She states that she does not recall any events of the episode. She has a little bit of a headache and some mild nausea. Denies any dizziness or vision changes. She has a history of glottis cancer. She was transferred here to [facility] for observation.   Past Medical History:  Diagnosis Date  . melanoma 48    Past Surgical History:  Procedure Laterality Date  . CESAREAN SECTION    . FREE FLAP RADIAL FOREARM  03/06/2019   Dr. Nicolette Bang [facility]  . MODIFIED RADICAL NECK DISSECTION  03/06/2019  . NECK DISSECTION  03/06/2019   bilateral neck dissection  . NERVE GRAFT Left 03/06/2019   left arm lower, Dr. Nicolette Bang at [facility]  . PHARYNGECTOMY  03/06/2019   Dr. Nicolette Bang at [facility]  . TOTAL LARYNGECTOMY  03/06/2019   Dr. Nicolette Bang at [facility]  . 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 Nicolette Bang [facility]  . WRIST SURGERY  06/06/2018   ORIF distal radius fracture    No Known Allergies  Social History   Tobacco Use  . Smoking status: Never Smoker  . Smokeless tobacco: Never Used  Substance Use Topics  . Alcohol use: Yes    Comment: wine occ    Family History  Problem Relation Age of Onset  . Hypertension Mother   . Lung cancer Father      Review of Systems  Positive ROS: as bove  All other systems have been reviewed and were otherwise negative with the exception of those mentioned in the HPI and as above.  Objective: Vital signs in last 24 hours: Temp:  [98.2 F (36.8 C)-98.4 F (36.9 C)] 98.2 F (36.8 C) (04/27 0609) Pulse Rate:  [63-90] 70 (04/27 0609) Resp:  [15-25] 20 (04/27 0609) BP: (98-140)/(70-88) 117/80 (04/27 0609) SpO2:  [96 %-100 %] 100 % (04/27 0609) Weight:  [51 kg] 51 kg (04/26 1900)  General Appearance: Alert, cooperative, no distress,  appears stated age Head: Normocephalic, without obvious abnormality, atraumatic Eyes: PERRL, conjunctiva/corneas clear, EOM's intact, fundi benign, both eyes      Neck: Supple, symmetrical, trachea midline, no adenopathy; thyroid: No enlargement/tenderness/nodules; no carotid bruit or JVD Lungs:  respirations unlabored Heart: Regular rate and rhythm Extremities: Extremities normal, atraumatic, no cyanosis or edema Pulses: 2+ and symmetric all extremities Skin: Skin color, texture, turgor normal, no rashes or lesions  NEUROLOGIC:   Mental status: A&O x4, no aphasia, good attention span, Memory and fund of knowledge Motor Exam - grossly normal, normal tone and bulk Sensory Exam - grossly normal Reflexes: symmetric, no pathologic reflexes, No Hoffman's, No clonus Coordination - grossly normal Gait - not tested Balance - not tested Cranial Nerves: I: smell Not tested  II: visual acuity  OS: na    OD: na  II: visual fields Full to confrontation  II: pupils Equal, round, reactive to light  III,VII: ptosis None  III,IV,VI: extraocular muscles  Full ROM  V: mastication Normal  V: facial light touch sensation  Normal  V,VII: corneal reflex  Present  VII: facial muscle function - upper  Normal  VII: facial muscle function - lower Normal  VIII: hearing Not tested  IX: soft palate elevation  Normal  IX,X: gag reflex Present  XI: trapezius strength  5/5  XI: sternocleidomastoid strength 5/5  XI: neck flexion strength  5/5  XII: tongue strength  Normal    Data Review Lab Results  Component Value Date   WBC 8.4 08/17/2020   HGB 12.3 08/17/2020   HCT 37.0 08/17/2020   MCV 95.6 08/17/2020   PLT 192 08/17/2020   Lab Results  Component Value Date   NA 135 08/17/2020   K 3.7 08/17/2020   CL 104 08/17/2020   CO2 21 (L) 08/17/2020   BUN 9 08/17/2020   CREATININE 0.57 08/17/2020   GLUCOSE 125 (H) 08/17/2020   Lab Results  Component Value Date   INR 0.9 08/17/2020     Radiology: CT Head Wo Contrast  Result Date: 08/16/2020 CLINICAL DATA:  Unwitnessed fall at the [facility]. Laceration to back of head. EXAM: CT HEAD WITHOUT CONTRAST TECHNIQUE: Contiguous axial images were obtained from the base of the skull through the vertex without intravenous contrast. COMPARISON:  Remote CT 12/21/2016 FINDINGS: Brain: Moderate volume right frontal subarachnoid hemorrhage, with a small adjacent subdural hemorrhage component. Small amount of subarachnoid hemorrhage in the right temporoparietal lobe. Right parietooccipital subdural hematoma measures up to 5 mm in thickness. 3 mm right to left midline shift. Bifrontal encephalomalacia site of prior hemorrhage. No evidence of acute ischemia or hydrocephalus. Vascular: Atherosclerosis of skullbase vasculature without hyperdense vessel or abnormal calcification. Skull: Right occipital bone fracture is remote. No acute skull fracture. Sinuses/Orbits: Paranasal sinuses and mastoid air cells are clear. The visualized orbits are unremarkable. Other: Left occipital scalp hematoma with skin staples in place. IMPRESSION: 1. Moderate volume right frontal subarachnoid hemorrhage, with a small adjacent subdural hemorrhage component. 2. Small amount of subarachnoid hemorrhage in the right temporoparietal lobe. Right parietooccipital subdural hematoma measures up to 5 mm in thickness. 3. Approximately 3 mm right to left midline shift. 4. Bifrontal encephalomalacia site of prior hemorrhage. 5. Left occipital scalp hematoma with skin staples in place. 6. No acute skull fracture. Right occipital skull fracture is remote. Critical Value/emergent results were called by telephone at the time of interpretation on 08/16/2020 at 8:39 pm to provider JOSEPH ZAMMIT , who verbally acknowledged these results. Electronically Signed   By: Keith Rake M.D.   On: 08/16/2020 20:39   CT Cervical Spine Wo Contrast  Result Date: 08/16/2020 CLINICAL DATA:   Unwitnessed fall at [facility]. Laceration to back of head. EXAM: CT CERVICAL SPINE WITHOUT CONTRAST TECHNIQUE: Multidetector CT imaging of the cervical spine was performed without intravenous contrast. Multiplanar CT image reconstructions were also generated. COMPARISON:  None. FINDINGS: Alignment: Minimal broad-based levo scoliotic curvature of the cervical spine. No traumatic malalignment. Skull base and vertebrae: No acute fracture. Vertebral body heights are maintained. The dens and skull base are intact. Soft tissues and spinal canal: No prevertebral fluid or swelling. No visible canal hematoma. Disc levels: Mild endplate spurring at H3-Z1 and C6-C7 with preservation of disc space. Scattered facet hypertrophy. Upper chest: Postsurgical change in the anterior neck with tracheotomy. Other: No acute or unexpected findings. IMPRESSION: Mild degenerative change in the cervical spine without acute fracture or subluxation. Electronically Signed   By: Keith Rake M.D.   On: 08/16/2020 20:43     Assessment/Plan: 70 year old female presented to [facility] ED last night after having a syncopal episode at the [facility]. CT head showed a right frontal SAH with small SDH and a small amount of SAH in the right temporoparietal lobe and minimal midline shift. We will repeat her head CT this morning.  If it is stable, will likely be ok for discharge later with close follow up in the office.    Ocie Cornfield [facility] 08/17/2020 7:37 AM

## 2020-08-17 NOTE — Discharge Summary (Signed)
Physician Discharge Summary  Melody Ward [id number] DOB: 1951/01/22 DOA: 08/16/2020  PCP: Erven Colla, DO  Admit date: 08/16/2020 Discharge date: 08/17/2020  Admitted From: home Disposition:  home  Recommendations for Outpatient Follow-up:  1. Follow up with PCP in 1-2 weeks 2. Follow-up with Dr. Saintclair Halsted in 1-2 weeks  Home Health: none Equipment/Devices: none  Discharge Condition: stable CODE STATUS: Full code Diet recommendation: regular  HPI: Per admitting MD, Melody Ward is a 70 y.o. 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  EMS was activated and patient was taken to the ED for further evaluation and management.  She was reported to have a previous head trauma.  Patient denies biting of tongue, urinary or bowel incontinence.  Hospital Course / Discharge diagnoses: Principal problem Subarachnoid hemorrhage/subdural hematoma -patient [facility] after syncopal episode.  Neurosurgery consulted and recommending overnight observation and repeat CT scan the next morning.  CT scan was reviewed the next day by Dr. Saintclair Halsted with neurosurgery and it appears to be stable to slightly improved.  Patient is clinically stable, has a mild headache but able to ambulate on her own and will be discharged home in stable condition with outpatient follow-up in 1 to 2 weeks and neurosurgery office.  She  was counseled to avoid aspirin and other NSAIDs for now  Active problems Syncope -this appears in the setting of exercising on a treadmill followed by going to the bathroom, query dehydration versus vasovagal syncope.  A 2D echocardiogram was done and it was fairly unremarkable.  Telemetry was without any significant arrhythmias.   Malignant neoplasm of glottis -stable, outpatient follow-up  Sepsis ruled out   Discharge Instructions   Allergies as of 08/17/2020   No Known Allergies     Medication List    STOP taking these medications   benzonatate 100 MG capsule Commonly known as: TESSALON   celecoxib 200 MG capsule Commonly known as: CELEBREX   dexamethasone 0.5 MG/5ML solution Commonly known as: DECADRON   lidocaine 2 % solution Commonly known as: XYLOCAINE   oxyCODONE 5 MG immediate release tablet Commonly known as: Oxy IR/ROXICODONE   pantoprazole 40 MG tablet Commonly known as: PROTONIX   polyethylene glycol powder 17 GM/SCOOP powder Commonly known as: GLYCOLAX/MIRALAX   sodium fluoride 1.1 % Crea dental cream Commonly known as: PreviDent 5000 Plus   traMADol 50 MG tablet Commonly known as: ULTRAM     TAKE these medications   acetaminophen 500 MG tablet Commonly known as: TYLENOL Take by mouth.       Follow-up Information    Kary Kos, MD Follow up in 2 week(s).   Specialty: Neurosurgery Contact information: [address] [place] [address] [phone]               Consultations:  neurosyrgery   Procedures/Studies:  CT Head Wo Contrast  Result Date: 08/16/2020 CLINICAL DATA:  Unwitnessed fall at the [facility].  Laceration to back of head. EXAM: CT HEAD WITHOUT CONTRAST TECHNIQUE: Contiguous axial images were obtained from the base of the skull through the vertex without intravenous contrast. COMPARISON:  Remote CT 12/21/2016 FINDINGS: Brain: Moderate volume right frontal subarachnoid hemorrhage, with a small  adjacent subdural hemorrhage component. Small amount of subarachnoid hemorrhage in the right temporoparietal lobe. Right parietooccipital subdural hematoma measures up to 5 mm in thickness. 3 mm right to left midline shift. Bifrontal encephalomalacia site of prior hemorrhage. No evidence of acute ischemia or hydrocephalus. Vascular: Atherosclerosis of skullbase vasculature without hyperdense vessel or abnormal calcification. Skull: Right occipital bone fracture is remote. No acute skull fracture. Sinuses/Orbits: Paranasal sinuses and mastoid air cells are clear. The visualized orbits are unremarkable. Other: Left occipital scalp hematoma with skin staples in place. IMPRESSION: 1. Moderate volume right frontal subarachnoid hemorrhage, with a small adjacent subdural hemorrhage component. 2. Small amount of subarachnoid hemorrhage in the right temporoparietal lobe. Right parietooccipital subdural hematoma measures up to 5 mm in thickness. 3. Approximately 3 mm right to left midline shift. 4. Bifrontal encephalomalacia site of prior hemorrhage. 5. Left occipital scalp hematoma with skin staples in place. 6. No acute skull fracture. Right occipital skull fracture is remote. Critical Value/emergent results were called by telephone at the time of interpretation on 08/16/2020 at 8:39 pm to provider JOSEPH ZAMMIT , who verbally acknowledged these results. Electronically Signed   By: Keith Rake M.D.   On: 08/16/2020 20:39   CT Cervical Spine Wo Contrast  Result Date: 08/16/2020 CLINICAL DATA:  Unwitnessed fall at [facility]. Laceration to back of head. EXAM: CT CERVICAL SPINE WITHOUT CONTRAST TECHNIQUE: Multidetector CT imaging of the cervical spine was performed without intravenous contrast. Multiplanar CT image reconstructions were also generated. COMPARISON:  None. FINDINGS: Alignment: Minimal broad-based levo scoliotic curvature of the cervical spine. No traumatic malalignment. Skull base and vertebrae: No  acute fracture. Vertebral body heights are maintained. The dens and skull base are intact. Soft tissues and spinal canal: No prevertebral fluid or swelling. No visible canal hematoma. Disc levels: Mild endplate spurring at D34-534 and C6-C7 with preservation of disc space. Scattered facet hypertrophy. Upper chest: Postsurgical change in the anterior neck with tracheotomy. Other: No acute or unexpected findings. IMPRESSION: Mild degenerative change in the cervical spine without acute fracture or subluxation. Electronically Signed   By: Keith Rake M.D.   On: 08/16/2020 20:43   ECHOCARDIOGRAM COMPLETE  Result Date: 08/17/2020    ECHOCARDIOGRAM REPORT   Patient Name:   MONISA HEISKELL Date of Exam: 08/17/2020 Medical Rec #:  [id number]     Height:       63.0 in Accession #:    [id number]    Weight:       112.4 lb Date of Birth:  [birth year]     BSA:          1.514 m Patient Age:    68 years      BP:           117/80 mmHg Patient Gender: F             HR:           70 bpm. Exam Location:  Inpatient Procedure: 2D Echo, Cardiac Doppler and Color Doppler Indications:    Syncope  History:        Patient has no prior history of Echocardiogram examinations.  Sonographer:    Cammy Brochure Referring Phys: [id number] OLADAPO ADEFESO IMPRESSIONS  1. Left ventricular  ejection fraction, by estimation, is 60 to 65%. The left ventricle has normal function. The left ventricle has no regional wall motion abnormalities. Left ventricular diastolic parameters were normal.  2. Right ventricular systolic function is normal. The right ventricular size is normal. There is normal pulmonary artery systolic pressure. The estimated right ventricular systolic pressure is 58.0 mmHg.  3. A small pericardial effusion is present. The pericardial effusion is anterior to the right ventricle.  4. The mitral valve is normal in structure. Mild mitral valve regurgitation. No evidence of mitral stenosis.  5. The aortic valve is tricuspid. Aortic valve  regurgitation is not visualized. No aortic stenosis is present.  6. The inferior vena cava is normal in size with greater than 50% respiratory variability, suggesting right atrial pressure of 3 mmHg. FINDINGS  Left Ventricle: Left ventricular ejection fraction, by estimation, is 60 to 65%. The left ventricle has normal function. The left ventricle has no regional wall motion abnormalities. The left ventricular internal cavity size was normal in size. There is  no left ventricular hypertrophy. Left ventricular diastolic parameters were normal. Right Ventricle: The right ventricular size is normal. No increase in right ventricular wall thickness. Right ventricular systolic function is normal. There is normal pulmonary artery systolic pressure. The tricuspid regurgitant velocity is 2.35 m/s, and  with an assumed right atrial pressure of 3 mmHg, the estimated right ventricular systolic pressure is 99.8 mmHg. Left Atrium: Left atrial size was normal in size. Right Atrium: Right atrial size was normal in size. Pericardium: A small pericardial effusion is present. The pericardial effusion is anterior to the right ventricle. Mitral Valve: The mitral valve is normal in structure. Mild mitral valve regurgitation. No evidence of mitral valve stenosis. Tricuspid Valve: The tricuspid valve is normal in structure. Tricuspid valve regurgitation is trivial. Aortic Valve: The aortic valve is tricuspid. Aortic valve regurgitation is not visualized. No aortic stenosis is present. Aortic valve mean gradient measures 4.0 mmHg. Aortic valve peak gradient measures 7.5 mmHg. Aortic valve area, by VTI measures 2.07 cm. Pulmonic Valve: The pulmonic valve was not well visualized. Pulmonic valve regurgitation is not visualized. Aorta: The aortic root and ascending aorta are structurally normal, with no evidence of dilitation. Venous: The inferior vena cava is normal in size with greater than 50% respiratory variability, suggesting right  atrial pressure of 3 mmHg. IAS/Shunts: The interatrial septum was not well visualized.  LEFT VENTRICLE PLAX 2D LVIDd:         3.80 cm     Diastology LVIDs:         2.65 cm     LV e' medial:    9.36 cm/s LV PW:         0.90 cm     LV E/e' medial:  8.5 LV IVS:        1.00 cm     LV e' lateral:   12.00 cm/s LVOT diam:     1.80 cm     LV E/e' lateral: 6.6 LV SV:         55 LV SV Index:   37 LVOT Area:     2.54 cm  LV Volumes (MOD) LV vol d, MOD A2C: 66.1 ml LV vol d, MOD A4C: 66.5 ml LV vol s, MOD A2C: 22.2 ml LV vol s, MOD A4C: 22.5 ml LV SV MOD A2C:     43.9 ml LV SV MOD A4C:     66.5 ml LV SV MOD BP:  43.3 ml RIGHT VENTRICLE             IVC RV Basal diam:  2.90 cm     IVC diam: 1.30 cm RV S prime:     16.80 cm/s TAPSE (M-mode): 2.5 cm LEFT ATRIUM             Index       RIGHT ATRIUM          Index LA diam:        2.70 cm 1.78 cm/m  RA Area:     9.90 cm LA Vol (A2C):   23.9 ml 15.79 ml/m RA Volume:   16.50 ml 10.90 ml/m LA Vol (A4C):   20.2 ml 13.34 ml/m LA Biplane Vol: 24.0 ml 15.85 ml/m  AORTIC VALVE AV Area (Vmax):    2.38 cm AV Area (Vmean):   2.14 cm AV Area (VTI):     2.07 cm AV Vmax:           137.00 cm/s AV Vmean:          90.700 cm/s AV VTI:            0.268 m AV Peak Grad:      7.5 mmHg AV Mean Grad:      4.0 mmHg LVOT Vmax:         128.00 cm/s LVOT Vmean:        76.400 cm/s LVOT VTI:          0.218 m LVOT/AV VTI ratio: 0.81  AORTA Ao Root diam: 2.90 cm Ao Asc diam:  3.40 cm MITRAL VALVE               TRICUSPID VALVE MV Area (PHT): 3.31 cm    TR Peak grad:   22.1 mmHg MV Decel Time: 229 msec    TR Vmax:        235.00 cm/s MV E velocity: 79.10 cm/s MV A velocity: 77.10 cm/s  SHUNTS MV E/A ratio:  1.03        Systemic VTI:  0.22 m                            Systemic Diam: 1.80 cm Oswaldo Milian MD Electronically signed by Oswaldo Milian MD Signature Date/Time: 08/17/2020/10:40:45 AM    Final       Subjective: - no chest pain, shortness of breath, no abdominal pain, nausea or  vomiting.   Discharge Exam: BP 112/79 (BP Location: Left Arm)   Pulse 84   Temp 98.6 F (37 C)   Resp 16   Ht 5\' 3"  (1.6 m)   Wt 51 kg   SpO2 97%   BMI 19.92 kg/m   General: Pt is alert, awake, not in acute distress Cardiovascular: RRR, S1/S2 +, no rubs, no gallops Respiratory: CTA bilaterally, no wheezing, no rhonchi Abdominal: Soft, NT, ND, bowel sounds + Extremities: no edema, no cyanosis    The results of significant diagnostics from this hospitalization (including imaging, microbiology, ancillary and laboratory) are listed below for reference.     Microbiology: Recent Results (from the past 240 hour(s))  Resp Panel by RT-PCR (Flu A&B, Covid) Nasopharyngeal Swab     Status: None   Collection Time: 08/16/20  9:13 PM   Specimen: Nasopharyngeal Swab; Nasopharyngeal(NP) swabs in vial transport medium  Result Value Ref Range Status   SARS Coronavirus 2 by RT PCR NEGATIVE NEGATIVE Final    Comment: (NOTE) SARS-CoV-2  target nucleic acids are NOT DETECTED.  The SARS-CoV-2 RNA is generally detectable in upper respiratory specimens during the acute phase of infection. The lowest concentration of SARS-CoV-2 viral copies this assay can detect is 138 copies/mL. A negative result does not preclude SARS-Cov-2 infection and should not be used as the sole basis for treatment or other patient management decisions. A negative result may occur with  improper specimen collection/handling, submission of specimen other than nasopharyngeal swab, presence of viral mutation(s) within the areas targeted by this assay, and inadequate number of viral copies(<138 copies/mL). A negative result must be combined with clinical observations, patient history, and epidemiological information. The expected result is Negative.  Fact Sheet for Patients:  EntrepreneurPulse.com.au  Fact Sheet for Healthcare Providers:  IncredibleEmployment.be  This test is no t yet  approved or cleared by the [place] FDA and  has been authorized for detection and/or diagnosis of SARS-CoV-2 by FDA under an Emergency Use Authorization (EUA). This EUA will remain  in effect (meaning this test can be used) for the duration of the COVID-19 declaration under Section 564(b)(1) of the Act, 21 U.S.C.section 360bbb-3(b)(1), unless the authorization is terminated  or revoked sooner.       Influenza A by PCR NEGATIVE NEGATIVE Final   Influenza B by PCR NEGATIVE NEGATIVE Final    Comment: (NOTE) The Xpert Xpress SARS-CoV-2/FLU/RSV plus assay is intended as an aid in the diagnosis of influenza from Nasopharyngeal swab specimens and should not be used as a sole basis for treatment. Nasal washings and aspirates are unacceptable for Xpert Xpress SARS-CoV-2/FLU/RSV testing.  Fact Sheet for Patients: EntrepreneurPulse.com.au  Fact Sheet for Healthcare Providers: IncredibleEmployment.be  This test is not yet approved or cleared by the [place] FDA and has been authorized for detection and/or diagnosis of SARS-CoV-2 by FDA under an Emergency Use Authorization (EUA). This EUA will remain in effect (meaning this test can be used) for the duration of the COVID-19 declaration under Section 564(b)(1) of the Act, 21 U.S.C. section 360bbb-3(b)(1), unless the authorization is terminated or revoked.  Performed at [facility], [address], [place], [place] [address]      Labs: Basic Metabolic Panel: Recent Labs  Lab 08/16/20 2113 08/17/20 0414  NA 137 135  K 3.5 3.7  CL 101 104  CO2 25 21*  GLUCOSE 102* 125*  BUN 10 9  CREATININE 0.65 0.57  CALCIUM 8.9 8.2*  MG  --  2.1  PHOS  --  4.0   Liver Function Tests: Recent Labs  Lab 08/16/20 2113 08/17/20 0414  AST 27 25  ALT 18 16  ALKPHOS 82 75  BILITOT 1.1 1.4*  PROT 7.4 6.6  ALBUMIN 4.2 3.7   CBC: Recent Labs  Lab 08/16/20 2113 08/17/20 0414  WBC 10.3 8.4   NEUTROABS 9.2*  --   HGB 12.9 12.3  HCT 38.6 37.0  MCV 95.3 95.6  PLT 205 192   CBG: No results for input(s): GLUCAP in the last 168 hours. Hgb A1c No results for input(s): HGBA1C in the last 72 hours. Lipid Profile No results for input(s): CHOL, HDL, LDLCALC, TRIG, CHOLHDL, LDLDIRECT in the last 72 hours. Thyroid function studies No results for input(s): TSH, T4TOTAL, T3FREE, THYROIDAB in the last 72 hours.  Invalid input(s): FREET3 Urinalysis No results found for: COLORURINE, APPEARANCEUR, LABSPEC, PHURINE, GLUCOSEU, HGBUR, BILIRUBINUR, KETONESUR, PROTEINUR, UROBILINOGEN, NITRITE, LEUKOCYTESUR  FURTHER DISCHARGE INSTRUCTIONS:   Get Medicines reviewed and adjusted: Please take all your medications with you for your  next visit with your Primary MD   Laboratory/radiological data: Please request your Primary MD to go over all hospital tests and procedure/radiological results at the follow up, please ask your Primary MD to get all Hospital records sent to his/her office.   In some cases, they will be blood work, cultures and biopsy results pending at the time of your discharge. Please request that your primary care M.D. goes through all the records of your hospital data and follows up on these results.   Also Note the following: If you experience worsening of your admission symptoms, develop shortness of breath, life threatening emergency, suicidal or homicidal thoughts you must seek medical attention immediately by calling 911 or calling your MD immediately  if symptoms less severe.   You must read complete instructions/literature along with all the possible adverse reactions/side effects for all the Medicines you take and that have been prescribed to you. Take any new Medicines after you have completely understood and accpet all the possible adverse reactions/side effects.    Do not drive when taking Pain medications or sleeping medications (Benzodaizepines)   Do not take more  than prescribed Pain, Sleep and Anxiety Medications. It is not advisable to combine anxiety,sleep and pain medications without talking with your primary care practitioner   Special Instructions: If you have smoked or chewed Tobacco  in the last 2 yrs please stop smoking, stop any regular Alcohol  and or any Recreational drug use.   Wear Seat belts while driving.   Please note: You were cared for by a hospitalist during your hospital stay. Once you are discharged, your primary care physician will handle any further medical issues. Please note that NO REFILLS for any discharge medications will be authorized once you are discharged, as it is imperative that you return to your primary care physician (or establish a relationship with a primary care physician if you do not have one) for your post hospital discharge needs so that they can reassess your need for medications and monitor your lab values.  Time coordinating discharge: 40 minutes  SIGNED:  Marzetta Board, MD, PhD 08/17/2020, 3:16 PM

## 2020-08-17 NOTE — ED Notes (Signed)
Report given to [facility]  

## 2020-08-18 NOTE — Telephone Encounter (Signed)
Pt has not established care with me.  I would recommend pt needing to come in for appt.    Thank you,   Dr. Lovena Le

## 2020-08-22 ENCOUNTER — Ambulatory Visit: Payer: Medicare HMO | Admitting: Family Medicine

## 2020-08-22 DIAGNOSIS — Z430 Encounter for attention to tracheostomy: Secondary | ICD-10-CM | POA: Diagnosis not present

## 2020-08-23 ENCOUNTER — Other Ambulatory Visit: Payer: Self-pay

## 2020-08-23 ENCOUNTER — Encounter: Payer: Self-pay | Admitting: Family Medicine

## 2020-08-23 ENCOUNTER — Ambulatory Visit (INDEPENDENT_AMBULATORY_CARE_PROVIDER_SITE_OTHER): Payer: Medicare HMO | Admitting: Family Medicine

## 2020-08-23 VITALS — BP 122/72 | HR 80 | Temp 97.2°F | Ht 63.0 in | Wt 109.0 lb

## 2020-08-23 DIAGNOSIS — R42 Dizziness and giddiness: Secondary | ICD-10-CM | POA: Diagnosis not present

## 2020-08-23 DIAGNOSIS — S0101XD Laceration without foreign body of scalp, subsequent encounter: Secondary | ICD-10-CM

## 2020-08-23 DIAGNOSIS — S065X9A Traumatic subdural hemorrhage with loss of consciousness of unspecified duration, initial encounter: Secondary | ICD-10-CM

## 2020-08-23 DIAGNOSIS — G44309 Post-traumatic headache, unspecified, not intractable: Secondary | ICD-10-CM | POA: Diagnosis not present

## 2020-08-23 DIAGNOSIS — S065XAA Traumatic subdural hemorrhage with loss of consciousness status unknown, initial encounter: Secondary | ICD-10-CM

## 2020-08-23 NOTE — Patient Instructions (Signed)
Concussion, Adult  A concussion is a brain injury from a hard, direct hit (trauma) to your head or body. This direct hit causes your brain to quickly shake back and forth inside your skull. A concussion may also be called a mild traumatic brain injury (TBI). Healing from this injury can take time. What are the causes? This condition is caused by:  A direct hit to your head, such as: ? Running into a player during a game. ? Being hit in a fight. ? Hitting your head on a hard surface.  A quick and sudden movement of the head or neck, such as in a car crash. What are the signs or symptoms? The signs of a concussion can be hard to notice. They may be missed by you, family members, and doctors. You may look fine on the outside but may not act or feel normal. Physical symptoms  Headaches.  Being dizzy.  Problems with body balance.  Being sensitive to light or noise.  Vomiting or feeling like you may vomit.  Being tired.  Problems seeing or hearing.  Not sleeping or eating as you used to.  Seizure. Mental and emotional symptoms  Feeling grouchy (irritable).  Having mood changes.  Problems remembering things.  Trouble focusing your mind (concentrating), organizing, or making decisions.  Being slow to think, act, react, speak, or read.  Feeling worried or nervous (anxious).  Feeling sad (depressed). How is this treated? This condition may be treated by:  Stopping sports or activity if you are injured. If you hit your head or have signs of concussion: ? Do not return to sports or activities the same day. ? Get checked by a doctor before you return to your activities.  Resting your body and your mind.  Being watched carefully, often at home.  Medicines to help with symptoms such as: ? Headaches. ? Feeling like you may vomit. ? Problems with sleep.  Avoiding alcohol and drugs.  Being asked to go to a concussion clinic or a place to help you recover  (rehabilitation center). Recovery from a concussion can take time. Return to activities only:  When you are fully healed.  When your doctor says it is safe. Avoid taking strong pain medicines (opioids) for a concussion. Follow these instructions at home: Activity  Limit activities that need a lot of thought or focus, such as: ? Homework or work for your job. ? Watching TV. ? Using the computer or phone. ? Playing memory games and puzzles.  Rest. Rest helps your brain heal. Make sure you: ? Get plenty of sleep. Most adults should get 7-9 hours of sleep each night. ? Rest during the day. Take naps or breaks when you feel tired.  Avoid activity like exercise until your doctor says its safe. Stop any activity that makes symptoms worse.  Do not do activities that could cause a second concussion, such as riding a bike or playing sports.  Ask your doctor when you can return to your normal activities, such as school, work, sports, and driving. Your ability to react may be slower. Do not do these activities if you are dizzy. General instructions  Take over-the-counter and prescription medicines only as told by your doctor.  Do not drink alcohol until your doctor says you can.  Watch your symptoms and tell other people to do the same. Other problems can occur after a concussion. Older adults have a higher risk of serious problems.  Tell your work Freight forwarder, teachers, Government social research officer, school  counselor, coach, or athletic trainer about your injury and symptoms. Tell them about what you can or cannot do.  Keep all follow-up visits as told by your doctor. This is important.   How is this prevented? It is very important that you do not get another brain injury. In rare cases, another injury can cause brain damage that will not go away, brain swelling, or death. The risk of this is greatest in the first 7-10 days after a head injury. To avoid injuries:  Stop activities that could lead to a second  concussion, such as contact sports, until your doctor says it is okay.  When you return to sports or activities: ? Do not crash into other players. This is how most concussions happen. ? Follow the rules. ? Respect other players. Do not engage in violent behavior while playing.  Get regular exercise. Do strength and balance training.  Wear a helmet that fits you well during sports, biking, or other activities.  Helmets can help protect you from serious skull and brain injuries, but they do not protect you from a concussion. Even when wearing a helmet, you should avoid being hit in the head. Contact a doctor if:  Your symptoms do not get better.  You have new symptoms.  You have another injury. Get help right away if:  You have bad headaches or your headaches get worse.  You feel weak or numb in any part of your body.  You feel mixed up (confused).  Your balance gets worse.  You vomit often.  You feel more sleepy than normal.  You cannot speak well, or have slurred speech.  You have a seizure.  Others have trouble waking you up.  You have changes in how you act.  You have changes in how you see (vision).  You pass out (lose consciousness). These symptoms may be an emergency. Do not wait to see if the symptoms will go away. Get medical help right away. Call your local emergency services (911 in the [place] Do not drive yourself to the hospital. Summary  A concussion is a brain injury from a hard, direct hit (trauma) to your head or body.  This condition is treated with rest and careful watching of symptoms.  Ask your doctor when you can return to your normal activities, such as school, work, or driving.  Get help right away if you have a very bad headache, feel weak in any part of your body, have a seizure, have changes in how you act or see, or if you are mixed up or more sleepy than normal. This information is not intended to replace advice given to you by your  health care provider. Make sure you discuss any questions you have with your health care provider. Document Revised: 02/19/2019 Document Reviewed: 02/19/2019 Elsevier Patient Education  [place].

## 2020-08-23 NOTE — Progress Notes (Signed)
Patient ID: Melody Ward, female    DOB: 11/07/1950, 70 y.o.   MRN: [id number]   Chief Complaint  Patient presents with  . Establish Care   Subjective:    HPI   Pt here to establish care with provider and talk about recent hospital on 08/16/20.  [facility] [facility] [place] for a ball; must have lost balance per patient. Admitted to [facility] from [facility]. LOC at [facility].   Pt wasn't feeling well and pt came in for visit to establish care. Pt fell and hit the head on floor at [facility]. Was in hosp and admitted. Subarachnoid hemorrhage from the fall.   Had scalp laceration and has about 9 staples in scalp.  Healing well.  Still feeling dizzy at times and a headache. Pt has trach.   The fall occurred when she went into a closet to get a ball at the [facility] and fell back and hit her head.  When bending over feels some dizziness.  Take tylenol for pain. Trying to get a ball and fell back and hit her head on floor and passed out.  Pt needing to talk with neurosurg about return to driving.  Most time with standing quickly and bending over feeling dizziness.  Not eating as good since having radiation on her throat. Pt went down to 109 lbs since had radiation and surgery on larynx.  had to have esophagus reconstructed. Pills can get stuck.  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   Medical History Melody Ward has a past medical history of melanoma (1990).   Outpatient Encounter Medications as of 08/23/2020  Medication Sig  . acetaminophen (TYLENOL) 500 MG tablet Take by mouth.   No facility-administered encounter medications on file as of 08/23/2020.     Review of Systems  Constitutional: Negative for chills and fever.  HENT: Negative for congestion, rhinorrhea and sore throat.   Respiratory: Negative for cough, shortness of breath and wheezing.   Cardiovascular: Negative for chest pain and leg swelling.  Gastrointestinal: Negative for abdominal pain, diarrhea, nausea and  vomiting.  Genitourinary: Negative for dysuria and frequency.  Musculoskeletal: Negative for arthralgias and back pain.  Skin: Positive for wound (scalp laceration). Negative for rash.  Neurological: Positive for dizziness and headaches. Negative for weakness.     Vitals BP 122/72   Pulse 80   Temp (!) 97.2 F (36.2 C)   Ht 5\' 3"  (1.6 m)   Wt 109 lb (49.4 kg)   SpO2 99%   BMI 19.31 kg/m   Objective:   Physical Exam Vitals and nursing note reviewed.  Constitutional:      Appearance: Normal appearance.  HENT:     Head: Normocephalic.     Comments: 9 staples in scalp from laceration, healing well. No drainage or erythema.    Nose: Nose normal.     Mouth/Throat:     Mouth: Mucous membranes are moist.     Pharynx: Oropharynx is clear.  Eyes:     Extraocular Movements: Extraocular movements intact.     Conjunctiva/sclera: Conjunctivae normal.     Pupils: Pupils are equal, round, and reactive to light.  Cardiovascular:     Rate and Rhythm: Normal rate and regular rhythm.     Pulses: Normal pulses.     Heart sounds: Normal heart sounds.  Pulmonary:     Effort: Pulmonary effort is normal.     Breath sounds: Normal breath sounds. No wheezing, rhonchi or rales.  Musculoskeletal:        General: Normal range of motion.     Right lower leg: No edema.     Left lower leg: No edema.  Skin:    General: Skin is warm and dry.     Findings: No lesion or rash.  Neurological:     General: No focal deficit present.     Mental Status: She is alert and oriented to person, place, and time.     Cranial Nerves: No cranial nerve deficit.     Motor: No weakness.     Gait: Gait normal.  Psychiatric:        Mood and Affect: Mood normal.        Behavior: Behavior normal.    Assessment and Plan   1. Subdural hematoma ([place])  2. Laceration of scalp, subsequent encounter  3. Post-traumatic headache, not intractable, unspecified chronicity pattern  4. Dizziness   Pt to f/u with [facility]  for the subdural hematoma and HA/dizziness.  Cont with tylenol and rest. appt next week with Neurosurgery. Pt having some intermittent dizziness. Cont to rest and may have post-concussive syndrome since had a significant head injury.  Rest/tylenol and f/u for staple removal with NS. Advising pt not to drive until cleared by NS. Pt and friend in agreement with plan.   Return if symptoms worsen or fail to improve.

## 2020-08-30 DIAGNOSIS — I609 Nontraumatic subarachnoid hemorrhage, unspecified: Secondary | ICD-10-CM | POA: Diagnosis not present

## 2020-08-30 DIAGNOSIS — R030 Elevated blood-pressure reading, without diagnosis of hypertension: Secondary | ICD-10-CM | POA: Diagnosis not present

## 2020-09-05 ENCOUNTER — Other Ambulatory Visit: Payer: Self-pay

## 2020-09-05 ENCOUNTER — Telehealth: Payer: Self-pay | Admitting: *Deleted

## 2020-09-05 DIAGNOSIS — Z1329 Encounter for screening for other suspected endocrine disorder: Secondary | ICD-10-CM

## 2020-09-05 NOTE — Progress Notes (Signed)
Melody Ward presents for follow up of radiation completed02/11/2019 to her neck  Pain issues, if any: headache due to fall and concussion Using a feeding tube?:none Weight changes, if any:  Filed Weights   09/06/20 1500  Weight: 112 lb (50.8 kg)    Swallowing issues, if any: Last SLP session at [facility] was 07/19/2020 Smoking or chewing tobacco? none Using fluoride trays daily? Not any more Last ENT visit was on: 05/17/2020 saw Dr. [name]:  "Examination General appearance of patient: healthy and no distress  . Quality of voice: alaryngeal ; quiet voice with TEP but easily understandable. . Inspection of head and face: Normocephalic, without obvious abnormality, sinuses nontender to percussion  . Bilateral parotid glands soft, nontender, no swelling, no masses/lesions  . Facial strength: intact, symmetric  . Oral cavity: tongue mobile, FOM soft, mucosa healthy throughout with no masses, lesions, ulcerations  . Oropharynx: mucosa of tonsillar fossae healthy; soft palate/uvula intact, mobile, mucosa healthy; tongue base soft, nontender; no mucosal ulcerations or masses or other worrisome lesions . Mirror Examination: proximal neopharynx/flap with no worrisome masses or lesions  . 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 Pathology with 2 cm tumor, negative margins, +PNI, no LVI, and 1/47 nodes ( 4 mm, left neck, no ENE). SHe underwent postop RT, completed 06/01/19 -There is no evidence of disease on exam today.  -She sees [name] in May. F/u with me in August , certainly sooner if there are problems or questions "   Other notable issues, if any: Having teeth cleaned every 4 months. States she fell at the [facility] and has a subdural bleed that is healing has some headache. Does have some left over dizziness from the  concussion.  BP 132/71 (BP Location: Left Arm, Patient Position: Sitting)   Pulse (!) 55   Temp (!) 97 F (36.1 C) (Temporal)   Resp 18   Ht 5\' 3"  (1.6 m)   Wt 112 lb (50.8 kg)   SpO2 100%   BMI 19.84 kg/m

## 2020-09-05 NOTE — Telephone Encounter (Signed)
CALLED PATIENT TO ASK ABOUT COMING IN FOR LAB TOMORROW PRIOR TO FU, PATIENT AGREED TO COME AT 2 PM

## 2020-09-06 ENCOUNTER — Encounter: Payer: Self-pay | Admitting: Radiation Oncology

## 2020-09-06 ENCOUNTER — Other Ambulatory Visit: Payer: Self-pay

## 2020-09-06 ENCOUNTER — Ambulatory Visit
Admission: RE | Admit: 2020-09-06 | Discharge: 2020-09-06 | Disposition: A | Discharge status: Home / Self Care | Payer: Medicare HMO | Source: Ambulatory Visit | Attending: Radiation Oncology | Admitting: Radiation Oncology

## 2020-09-06 VITALS — BP 132/71 | HR 55 | Temp 97.0°F | Resp 18 | Ht 63.0 in | Wt 112.0 lb

## 2020-09-06 DIAGNOSIS — R42 Dizziness and giddiness: Secondary | ICD-10-CM | POA: Insufficient documentation

## 2020-09-06 DIAGNOSIS — Z08 Encounter for follow-up examination after completed treatment for malignant neoplasm: Secondary | ICD-10-CM | POA: Diagnosis not present

## 2020-09-06 DIAGNOSIS — Z85819 Personal history of malignant neoplasm of unspecified site of lip, oral cavity, and pharynx: Secondary | ICD-10-CM | POA: Diagnosis not present

## 2020-09-06 DIAGNOSIS — Z1329 Encounter for screening for other suspected endocrine disorder: Secondary | ICD-10-CM

## 2020-09-06 DIAGNOSIS — Z923 Personal history of irradiation: Secondary | ICD-10-CM | POA: Insufficient documentation

## 2020-09-06 DIAGNOSIS — G9389 Other specified disorders of brain: Secondary | ICD-10-CM | POA: Insufficient documentation

## 2020-09-06 DIAGNOSIS — Z79899 Other long term (current) drug therapy: Secondary | ICD-10-CM | POA: Diagnosis not present

## 2020-09-06 DIAGNOSIS — Z9181 History of falling: Secondary | ICD-10-CM | POA: Diagnosis not present

## 2020-09-06 DIAGNOSIS — C320 Malignant neoplasm of glottis: Secondary | ICD-10-CM | POA: Diagnosis not present

## 2020-09-06 DIAGNOSIS — I313 Pericardial effusion (noninflammatory): Secondary | ICD-10-CM | POA: Diagnosis not present

## 2020-09-06 DIAGNOSIS — Z8521 Personal history of malignant neoplasm of larynx: Secondary | ICD-10-CM | POA: Diagnosis not present

## 2020-09-06 LAB — TSH: TSH: 3.068 u[IU]/mL (ref 0.308–3.960)

## 2020-09-09 NOTE — Progress Notes (Signed)
Radiation Oncology         [phone] ________________________________  Name: Melody Ward MRN: [id number]  Date: 09/06/2020  DOB: 1950/11/05  Follow-Up Visit Note in person  CC: Erven Colla, DO  [name], MD  Diagnosis and Prior Radiotherapy:       ICD-10-CM   1. 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 presents for follow up of radiation completed02/11/2019 to her neck  Pain issues, if any: headache due to fall and concussion at [facility], but she is recovering  Using a feeding tube?:none  Weight changes, if any:  Wt Readings from Last 3 Encounters:  09/06/20 112 lb (50.8 kg)  08/23/20 109 lb (49.4 kg)  08/16/20 112 lb 7 oz (51 kg)    Swallowing issues, if any: Last SLP session at [facility] was 07/19/2020  Smoking or chewing tobacco? None  Using fluoride trays daily? Not any more  Last ENT visit was on: 05/17/2020 saw Dr. [name]:  "Examination General appearance of patient: healthy and no distress  . Quality of voice: alaryngeal ; quiet voice with TEP but easily understandable. . Inspection of head and face: Normocephalic, without obvious abnormality, sinuses nontender to percussion  . Bilateral parotid glands soft, nontender, no swelling, no masses/lesions  . Facial strength: intact, symmetric  . Oral cavity: tongue mobile, FOM soft, mucosa healthy throughout with no masses, lesions, ulcerations  . Oropharynx: mucosa of tonsillar fossae healthy; soft palate/uvula intact, mobile, mucosa healthy; tongue base soft, nontender; no mucosal ulcerations or masses or other  worrisome lesions . Mirror Examination: proximal neopharynx/flap with no worrisome masses or lesions  . 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 Pathology with 2 cm tumor, negative margins, +PNI, no LVI, and 1/47 nodes ( 4 mm, left neck, no ENE). SHe underwent postop RT, completed 06/01/19 -There is no evidence of disease on exam today.  -She sees [name] in May. F/u with me in August , certainly sooner if there are problems or questions "   Other notable issues, if any: Having teeth cleaned every 4 months. States she fell at the [facility] and has a subdural bleed that is healing has some headache. Does have some left over dizziness from the concussion.    ALLERGIES:  has No Known Allergies.  Meds: Current Outpatient Medications  Medication Sig Dispense Refill  . acetaminophen (TYLENOL) 500 MG tablet Take by mouth.     No current facility-administered medications for this encounter.    Physical Findings: The patient is in no acute distress. Patient is alert and oriented. Wt Readings from Last 3 Encounters:  09/06/20 112 lb (50.8 kg)  08/23/20 109 lb (49.4 kg)  08/16/20 112 lb 7 oz (51 kg)    height is 5\' 3"  (1.6 m) and weight is 112 lb (50.8 kg). Her temporal temperature is 97 F (36.1 C) (abnormal). Her blood pressure is 132/71 and her pulse is 55 (abnormal). Her respiration is 18 and  oxygen saturation is 100%. .   General: Alert and oriented, in no acute distress HEENT: Head is normocephalic. Extraocular movements are intact. Oropharynx is notable for no mucositis or thrush. Stoma intact, without any lesion. Neck: No palpable lymphadenopathy  Skin: Skin in treatment fields shows satisfactory healing  Heart is regular in rate and rhythm Chest is clear to auscultation bilaterally Psychiatric: Judgment and insight are  intact. Affect is appropriate.  PROCEDURE NOTE: After obtaining consent and anesthetizing the nasal cavity with oxymetazoline, the flexible endoscope was introduced and passed through the nasal cavity.  The nasopharynx, oropharynx, proximal neopharynx/flap were then examined.  No evidence of disease visualized.  She tolerated the procedure well.  Lab Findings: Lab Results  Component Value Date   WBC 8.4 08/17/2020   HGB 12.3 08/17/2020   HCT 37.0 08/17/2020   MCV 95.6 08/17/2020   PLT 192 08/17/2020    Lab Results  Component Value Date   TSH 3.068 09/06/2020   CMP     Component Value Date/Time   NA 135 08/17/2020 0414   NA 143 03/29/2016 1429   K 3.7 08/17/2020 0414   CL 104 08/17/2020 0414   CO2 21 (L) 08/17/2020 0414   GLUCOSE 125 (H) 08/17/2020 0414   BUN 9 08/17/2020 0414   BUN 9 03/29/2016 1429   CREATININE 0.57 08/17/2020 0414   CREATININE 0.71 10/02/2019 1157   CALCIUM 8.2 (L) 08/17/2020 0414   PROT 6.6 08/17/2020 0414   PROT 7.1 03/29/2016 1429   ALBUMIN 3.7 08/17/2020 0414   ALBUMIN 4.8 03/29/2016 1429   AST 25 08/17/2020 0414   ALT 16 08/17/2020 0414   ALKPHOS 75 08/17/2020 0414   BILITOT 1.4 (H) 08/17/2020 0414   BILITOT 0.8 03/29/2016 1429   GFRNONAA >60 08/17/2020 0414   GFRNONAA >60 10/02/2019 1157   GFRAA >60 10/02/2019 1157    Radiographic Findings: CT HEAD WO CONTRAST  Result Date: 08/17/2020 CLINICAL DATA:  Subarachnoid hemorrhage follow-up. EXAM: CT HEAD WITHOUT CONTRAST TECHNIQUE: Contiguous axial images were obtained from the base of the skull through the vertex without intravenous contrast. COMPARISON:  August 16, 2020. FINDINGS: Brain: Slight interval increase in a mixed density right subdural hematoma which measures up to 5 mm with slightly increased hyperdense blood products posteriorly. Similar right frontotemporal small volume subarachnoid hemorrhage. Similar bifrontal encephalomalacia. Similar remote right cerebellar lacunar infarct. No  hydrocephalus. No mass lesion. No evidence of acute large vascular territory infarct. Vascular: No hyperdense vessel identified. Skull: Left posterior contusion with skin staples. No acute fracture. Sinuses/Orbits: Clear sinuses. Left ethmoid osteoma. Unremarkable orbits. Other: No mastoid effusions. IMPRESSION: 1. Slight interval increase in size of the right cerebral convexity subdural hemorrhage anteriorly with slightly increased layering posterior hyperdense blood products. Overall maximal thickness is similar, measuring 5 mm. Similar versus minimally increased leftward midline shift, measuring 4 mm. 2. Similar right frontotemporal small volume subarachnoid hemorrhage. 3. Similar bifrontal inferior encephalomalacia and remote right cerebellar infarct Electronically Signed   By: Margaretha Sheffield MD   On: 08/17/2020 11:25   CT Head Wo Contrast  Result Date: 08/16/2020 CLINICAL DATA:  Unwitnessed fall at the [facility]. Laceration to back of head. EXAM: CT HEAD WITHOUT CONTRAST TECHNIQUE: Contiguous axial images were obtained from the base of the skull through the vertex without intravenous contrast. COMPARISON:  Remote CT 12/21/2016 FINDINGS: Brain: Moderate volume right frontal subarachnoid hemorrhage, with a small adjacent subdural hemorrhage component. Small amount of subarachnoid hemorrhage in the right temporoparietal lobe. Right parietooccipital subdural  hematoma measures up to 5 mm in thickness. 3 mm right to left midline shift. Bifrontal encephalomalacia site of prior hemorrhage. No evidence of acute ischemia or hydrocephalus. Vascular: Atherosclerosis of skullbase vasculature without hyperdense vessel or abnormal calcification. Skull: Right occipital bone fracture is remote. No acute skull fracture. Sinuses/Orbits: Paranasal sinuses and mastoid air cells are clear. The visualized orbits are unremarkable. Other: Left occipital scalp hematoma with skin staples in place. IMPRESSION: 1. Moderate volume  right frontal subarachnoid hemorrhage, with a small adjacent subdural hemorrhage component. 2. Small amount of subarachnoid hemorrhage in the right temporoparietal lobe. Right parietooccipital subdural hematoma measures up to 5 mm in thickness. 3. Approximately 3 mm right to left midline shift. 4. Bifrontal encephalomalacia site of prior hemorrhage. 5. Left occipital scalp hematoma with skin staples in place. 6. No acute skull fracture. Right occipital skull fracture is remote. Critical Value/emergent results were called by telephone at the time of interpretation on 08/16/2020 at 8:39 pm to provider JOSEPH ZAMMIT , who verbally acknowledged these results. Electronically Signed   By: Keith Rake M.D.   On: 08/16/2020 20:39   CT Cervical Spine Wo Contrast  Result Date: 08/16/2020 CLINICAL DATA:  Unwitnessed fall at [facility]. Laceration to back of head. EXAM: CT CERVICAL SPINE WITHOUT CONTRAST TECHNIQUE: Multidetector CT imaging of the cervical spine was performed without intravenous contrast. Multiplanar CT image reconstructions were also generated. COMPARISON:  None. FINDINGS: Alignment: Minimal broad-based levo scoliotic curvature of the cervical spine. No traumatic malalignment. Skull base and vertebrae: No acute fracture. Vertebral body heights are maintained. The dens and skull base are intact. Soft tissues and spinal canal: No prevertebral fluid or swelling. No visible canal hematoma. Disc levels: Mild endplate spurring at Y6-V7 and C6-C7 with preservation of disc space. Scattered facet hypertrophy. Upper chest: Postsurgical change in the anterior neck with tracheotomy. Other: No acute or unexpected findings. IMPRESSION: Mild degenerative change in the cervical spine without acute fracture or subluxation. Electronically Signed   By: Keith Rake M.D.   On: 08/16/2020 20:43   ECHOCARDIOGRAM COMPLETE  Result Date: 08/17/2020    ECHOCARDIOGRAM REPORT   Patient Name:   ALEXCIA SCHOOLS Date of Exam:  08/17/2020 Medical Rec #:  [id number]     Height:       63.0 in Accession #:    [id number]    Weight:       112.4 lb Date of Birth:  July 28, 1950     BSA:          1.514 m Patient Age:    69 years      BP:           117/80 mmHg Patient Gender: F             HR:           70 bpm. Exam Location:  Inpatient Procedure: 2D Echo, Cardiac Doppler and Color Doppler Indications:    Syncope  History:        Patient has no prior history of Echocardiogram examinations.  Sonographer:    Cammy Brochure Referring Phys: [id number] OLADAPO ADEFESO IMPRESSIONS  1. Left ventricular ejection fraction, by estimation, is 60 to 65%. The left ventricle has normal function. The left ventricle has no regional wall motion abnormalities. Left ventricular diastolic parameters were normal.  2. Right ventricular systolic function is normal. The right ventricular size is normal. There is normal pulmonary artery systolic pressure. The estimated right ventricular systolic pressure is 62.8 mmHg.  3.  A small pericardial effusion is present. The pericardial effusion is anterior to the right ventricle.  4. The mitral valve is normal in structure. Mild mitral valve regurgitation. No evidence of mitral stenosis.  5. The aortic valve is tricuspid. Aortic valve regurgitation is not visualized. No aortic stenosis is present.  6. The inferior vena cava is normal in size with greater than 50% respiratory variability, suggesting right atrial pressure of 3 mmHg. FINDINGS  Left Ventricle: Left ventricular ejection fraction, by estimation, is 60 to 65%. The left ventricle has normal function. The left ventricle has no regional wall motion abnormalities. The left ventricular internal cavity size was normal in size. There is  no left ventricular hypertrophy. Left ventricular diastolic parameters were normal. Right Ventricle: The right ventricular size is normal. No increase in right ventricular wall thickness. Right ventricular systolic function is normal. There is  normal pulmonary artery systolic pressure. The tricuspid regurgitant velocity is 2.35 m/s, and  with an assumed right atrial pressure of 3 mmHg, the estimated right ventricular systolic pressure is 40.9 mmHg. Left Atrium: Left atrial size was normal in size. Right Atrium: Right atrial size was normal in size. Pericardium: A small pericardial effusion is present. The pericardial effusion is anterior to the right ventricle. Mitral Valve: The mitral valve is normal in structure. Mild mitral valve regurgitation. No evidence of mitral valve stenosis. Tricuspid Valve: The tricuspid valve is normal in structure. Tricuspid valve regurgitation is trivial. Aortic Valve: The aortic valve is tricuspid. Aortic valve regurgitation is not visualized. No aortic stenosis is present. Aortic valve mean gradient measures 4.0 mmHg. Aortic valve peak gradient measures 7.5 mmHg. Aortic valve area, by VTI measures 2.07 cm. Pulmonic Valve: The pulmonic valve was not well visualized. Pulmonic valve regurgitation is not visualized. Aorta: The aortic root and ascending aorta are structurally normal, with no evidence of dilitation. Venous: The inferior vena cava is normal in size with greater than 50% respiratory variability, suggesting right atrial pressure of 3 mmHg. IAS/Shunts: The interatrial septum was not well visualized.  LEFT VENTRICLE PLAX 2D LVIDd:         3.80 cm     Diastology LVIDs:         2.65 cm     LV e' medial:    9.36 cm/s LV PW:         0.90 cm     LV E/e' medial:  8.5 LV IVS:        1.00 cm     LV e' lateral:   12.00 cm/s LVOT diam:     1.80 cm     LV E/e' lateral: 6.6 LV SV:         55 LV SV Index:   37 LVOT Area:     2.54 cm  LV Volumes (MOD) LV vol d, MOD A2C: 66.1 ml LV vol d, MOD A4C: 66.5 ml LV vol s, MOD A2C: 22.2 ml LV vol s, MOD A4C: 22.5 ml LV SV MOD A2C:     43.9 ml LV SV MOD A4C:     66.5 ml LV SV MOD BP:      43.3 ml RIGHT VENTRICLE             IVC RV Basal diam:  2.90 cm     IVC diam: 1.30 cm RV S prime:      16.80 cm/s TAPSE (M-mode): 2.5 cm LEFT ATRIUM             Index  RIGHT ATRIUM          Index LA diam:        2.70 cm 1.78 cm/m  RA Area:     9.90 cm LA Vol (A2C):   23.9 ml 15.79 ml/m RA Volume:   16.50 ml 10.90 ml/m LA Vol (A4C):   20.2 ml 13.34 ml/m LA Biplane Vol: 24.0 ml 15.85 ml/m  AORTIC VALVE AV Area (Vmax):    2.38 cm AV Area (Vmean):   2.14 cm AV Area (VTI):     2.07 cm AV Vmax:           137.00 cm/s AV Vmean:          90.700 cm/s AV VTI:            0.268 m AV Peak Grad:      7.5 mmHg AV Mean Grad:      4.0 mmHg LVOT Vmax:         128.00 cm/s LVOT Vmean:        76.400 cm/s LVOT VTI:          0.218 m LVOT/AV VTI ratio: 0.81  AORTA Ao Root diam: 2.90 cm Ao Asc diam:  3.40 cm MITRAL VALVE               TRICUSPID VALVE MV Area (PHT): 3.31 cm    TR Peak grad:   22.1 mmHg MV Decel Time: 229 msec    TR Vmax:        235.00 cm/s MV E velocity: 79.10 cm/s MV A velocity: 77.10 cm/s  SHUNTS MV E/A ratio:  1.03        Systemic VTI:  0.22 m                            Systemic Diam: 1.80 cm Oswaldo Milian MD Electronically signed by Oswaldo Milian MD Signature Date/Time: 08/17/2020/10:40:45 AM    Final     Impression/Plan:    1) Head and Neck Cancer Status: No evidence of disease   2) Nutritional Status: Stable  3) Risk Factors: The patient has been educated about risk factors including alcohol and tobacco abuse; they understand that avoidance of alcohol and tobacco is important to prevent recurrences as well as other cancers; abstaining   4) Swallowing: Functional, continue speech-language pathology exercises  5) Dental: Encouraged to continue regular followup with dentistry, and dental hygiene including fluoride treatments as recommended  6) Thyroid function: check annually; WNL today  Lab Results  Component Value Date   TSH 3.068 09/06/2020   7) Of note, MRI of abdomen in December determined that the stable lesions in her liver are most likely tiny hemangiomas and or  cysts  8) I will see her back in December and she will follow-up with otolaryngology in July   On service date, in total, I spent 30 minutes on this encounter.  She was seen in person. _____________________________________   Eppie Gibson, MD

## 2020-09-22 ENCOUNTER — Other Ambulatory Visit: Payer: Self-pay | Admitting: Neurosurgery

## 2020-09-22 DIAGNOSIS — S129XXA Fracture of neck, unspecified, initial encounter: Secondary | ICD-10-CM

## 2020-09-23 ENCOUNTER — Other Ambulatory Visit: Payer: Self-pay | Admitting: Neurosurgery

## 2020-09-23 DIAGNOSIS — I609 Nontraumatic subarachnoid hemorrhage, unspecified: Secondary | ICD-10-CM

## 2020-10-03 DIAGNOSIS — Z430 Encounter for attention to tracheostomy: Secondary | ICD-10-CM | POA: Diagnosis not present

## 2020-10-04 DIAGNOSIS — Z430 Encounter for attention to tracheostomy: Secondary | ICD-10-CM | POA: Diagnosis not present

## 2020-10-06 DIAGNOSIS — Z1283 Encounter for screening for malignant neoplasm of skin: Secondary | ICD-10-CM | POA: Diagnosis not present

## 2020-10-06 DIAGNOSIS — C44622 Squamous cell carcinoma of skin of right upper limb, including shoulder: Secondary | ICD-10-CM | POA: Diagnosis not present

## 2020-10-06 DIAGNOSIS — Z08 Encounter for follow-up examination after completed treatment for malignant neoplasm: Secondary | ICD-10-CM | POA: Diagnosis not present

## 2020-10-06 DIAGNOSIS — X32XXXD Exposure to sunlight, subsequent encounter: Secondary | ICD-10-CM | POA: Diagnosis not present

## 2020-10-06 DIAGNOSIS — C44529 Squamous cell carcinoma of skin of other part of trunk: Secondary | ICD-10-CM | POA: Diagnosis not present

## 2020-10-06 DIAGNOSIS — D225 Melanocytic nevi of trunk: Secondary | ICD-10-CM | POA: Diagnosis not present

## 2020-10-06 DIAGNOSIS — L570 Actinic keratosis: Secondary | ICD-10-CM | POA: Diagnosis not present

## 2020-10-06 DIAGNOSIS — C44320 Squamous cell carcinoma of skin of unspecified parts of face: Secondary | ICD-10-CM | POA: Diagnosis not present

## 2020-10-06 DIAGNOSIS — Z85820 Personal history of malignant melanoma of skin: Secondary | ICD-10-CM | POA: Diagnosis not present

## 2020-10-07 ENCOUNTER — Other Ambulatory Visit: Payer: Self-pay

## 2020-10-07 ENCOUNTER — Ambulatory Visit
Admission: RE | Admit: 2020-10-07 | Discharge: 2020-10-07 | Disposition: A | Discharge status: Home / Self Care | Payer: Medicare HMO | Source: Ambulatory Visit | Attending: Neurosurgery | Admitting: Neurosurgery

## 2020-10-07 DIAGNOSIS — G9389 Other specified disorders of brain: Secondary | ICD-10-CM | POA: Diagnosis not present

## 2020-10-07 DIAGNOSIS — S0990XA Unspecified injury of head, initial encounter: Secondary | ICD-10-CM | POA: Diagnosis not present

## 2020-10-07 DIAGNOSIS — I609 Nontraumatic subarachnoid hemorrhage, unspecified: Secondary | ICD-10-CM

## 2020-10-19 DIAGNOSIS — R490 Dysphonia: Secondary | ICD-10-CM | POA: Diagnosis not present

## 2020-10-20 DIAGNOSIS — I609 Nontraumatic subarachnoid hemorrhage, unspecified: Secondary | ICD-10-CM | POA: Diagnosis not present

## 2020-11-15 DIAGNOSIS — Z9002 Acquired absence of larynx: Secondary | ICD-10-CM | POA: Diagnosis not present

## 2020-11-15 DIAGNOSIS — Z8521 Personal history of malignant neoplasm of larynx: Secondary | ICD-10-CM | POA: Diagnosis not present

## 2020-11-15 DIAGNOSIS — Z923 Personal history of irradiation: Secondary | ICD-10-CM | POA: Diagnosis not present

## 2020-11-15 DIAGNOSIS — Z08 Encounter for follow-up examination after completed treatment for malignant neoplasm: Secondary | ICD-10-CM | POA: Diagnosis not present

## 2020-11-15 DIAGNOSIS — C329 Malignant neoplasm of larynx, unspecified: Secondary | ICD-10-CM | POA: Diagnosis not present

## 2020-11-15 DIAGNOSIS — Z9089 Acquired absence of other organs: Secondary | ICD-10-CM | POA: Diagnosis not present

## 2020-11-23 DIAGNOSIS — D045 Carcinoma in situ of skin of trunk: Secondary | ICD-10-CM | POA: Diagnosis not present

## 2020-11-23 DIAGNOSIS — L570 Actinic keratosis: Secondary | ICD-10-CM | POA: Diagnosis not present

## 2020-11-23 DIAGNOSIS — X32XXXD Exposure to sunlight, subsequent encounter: Secondary | ICD-10-CM | POA: Diagnosis not present

## 2020-11-23 DIAGNOSIS — D0439 Carcinoma in situ of skin of other parts of face: Secondary | ICD-10-CM | POA: Diagnosis not present

## 2020-11-30 DIAGNOSIS — K222 Esophageal obstruction: Secondary | ICD-10-CM | POA: Diagnosis not present

## 2020-11-30 DIAGNOSIS — R1310 Dysphagia, unspecified: Secondary | ICD-10-CM | POA: Diagnosis not present

## 2020-11-30 DIAGNOSIS — Z9002 Acquired absence of larynx: Secondary | ICD-10-CM | POA: Diagnosis not present

## 2021-01-11 DIAGNOSIS — Z20822 Contact with and (suspected) exposure to covid-19: Secondary | ICD-10-CM | POA: Diagnosis not present

## 2021-01-12 ENCOUNTER — Other Ambulatory Visit: Payer: Self-pay

## 2021-01-12 ENCOUNTER — Ambulatory Visit
Admission: EM | Admit: 2021-01-12 | Discharge: 2021-01-12 | Disposition: A | Discharge status: Home / Self Care | Payer: Medicare HMO | Attending: Emergency Medicine | Admitting: Emergency Medicine

## 2021-01-12 DIAGNOSIS — U071 COVID-19: Secondary | ICD-10-CM

## 2021-01-12 DIAGNOSIS — J069 Acute upper respiratory infection, unspecified: Secondary | ICD-10-CM

## 2021-01-12 MED ORDER — MOLNUPIRAVIR 200 MG PO CAPS: 4.0000 | ORAL_CAPSULE | Freq: Two times a day (BID) | ORAL | 0 refills | Status: AC

## 2021-01-12 NOTE — ED Triage Notes (Signed)
Pt presents with positive covid test and wants to see if eligible for antiviarals

## 2021-01-12 NOTE — Discharge Instructions (Signed)
You should remain isolated in your home for 5 days from symptom onset AND greater than 72 hours after symptoms resolution (absence of fever without the use of fever-reducing medication and improvement in respiratory symptoms), whichever is longer Get plenty of rest and push fluids Antiviral medication prescribed Use OTC zyrtec for nasal congestion, runny nose, and/or sore throat Use OTC flonase for nasal congestion and runny nose Use medications daily for symptom relief Use OTC medications like ibuprofen or tylenol as needed fever or pain Call or go to the ED if you have any new or worsening symptoms such as fever, cough, shortness of breath, chest tightness, chest pain, turning blue, changes in mental status, etc..Marland Kitchen

## 2021-01-12 NOTE — ED Provider Notes (Signed)
[place]   [id number] 01/12/21 Arrival Time: 1419   CC: COVID symptoms  SUBJECTIVE: History from: patient.  ROSALENE WARDROP is a 70 y.o. female who presents with runny nose and congestion x 2 days.  Reports positive covid exposure.  Had positive home test.  Denies alleviating or aggravating factors.  Denies fever, chills, SOB, wheezing, chest pain, nausea, changes in bowel or bladder habits.    ROS: As per HPI.  All other pertinent ROS negative.     Past Medical History:  Diagnosis Date   melanoma 1990   Past Surgical History:  Procedure Laterality Date   CESAREAN SECTION     FREE FLAP RADIAL FOREARM  03/06/2019   Dr. Nicolette Bang [facility]   MODIFIED RADICAL NECK DISSECTION  03/06/2019   NECK DISSECTION  03/06/2019   bilateral neck dissection   NERVE GRAFT Left 03/06/2019   left arm lower, Dr. Nicolette Bang at [facility]   PHARYNGECTOMY  03/06/2019   Dr. Nicolette Bang at [place]  03/06/2019   Dr. Nicolette Bang at [place]  03/06/2019   pharyngoplasty, voice prosthesis/TEP placement, Dr. Nicolette Bang [place]  06/06/2018   ORIF distal radius fracture   No Known Allergies No current facility-administered medications on file prior to encounter.   Current Outpatient Medications on File Prior to Encounter  Medication Sig Dispense Refill   acetaminophen (TYLENOL) 500 MG tablet Take by mouth.     Social History   Socioeconomic History   Marital status: Married    Spouse name: Not on file   Number of children: 1   Years of education: Not on file   Highest education level: Not on file  Occupational History   Not on file  Tobacco Use   Smoking status: Never   Smokeless tobacco: Never  Vaping Use   Vaping Use: Never used  Substance and Sexual Activity   Alcohol use: Yes    Comment: wine occ   Drug use: Not Currently   Sexual activity: Not on file  Other Topics Concern   Not on file  Social History Narrative   Not on file   Social  Determinants of Health   Financial Resource Strain: Not on file  Food Insecurity: Not on file  Transportation Needs: Not on file  Physical Activity: Not on file  Stress: Not on file  Social Connections: Not on file  Intimate Partner Violence: Not on file   Family History  Problem Relation Age of Onset   Hypertension Mother    Lung cancer Father     OBJECTIVE:  Vitals:   01/12/21 1428  BP: 130/80  Pulse: 94  Resp: 20  Temp: 98.2 F (36.8 C)  SpO2: 98%    General appearance: alert; well-appearing, nontoxic; speaking in full sentences and tolerating own secretions HEENT: NCAT; Ears: EACs clear, TMs pearly gray; Eyes: PERRL.  EOM grossly intact.Nose: nares patent without rhinorrhea, Throat: oropharynx clear, tonsils non erythematous or enlarged, uvula midline  Neck: supple without LAD;  tracheostomy present Lungs: unlabored respirations, symmetrical air entry; cough: absent; no respiratory distress; CTAB Heart: regular rate and rhythm.  Skin: warm and dry Psychological: alert and cooperative; normal mood and affect   ASSESSMENT & PLAN:  1. COVID-19 virus infection   2. Viral URI with cough     Meds ordered this encounter  Medications   molnupiravir EUA (LAGEVRIO) 200 MG CAPS capsule    Sig: Take 4 capsules (800 mg total) by mouth  in the morning and at bedtime for 5 days.    Dispense:  40 capsule    Refill:  0    Order Specific Question:   Supervising Provider    Answer:   Raylene Everts [[id number]]    You should remain isolated in your home for 5 days from symptom onset AND greater than 72 hours after symptoms resolution (absence of fever without the use of fever-reducing medication and improvement in respiratory symptoms), whichever is longer Get plenty of rest and push fluids Antiviral medication prescribed Use OTC zyrtec for nasal congestion, runny nose, and/or sore throat Use OTC flonase for nasal congestion and runny nose Use medications daily for symptom  relief Use OTC medications like ibuprofen or tylenol as needed fever or pain Call or go to the ED if you have any new or worsening symptoms such as fever, cough, shortness of breath, chest tightness, chest pain, turning blue, changes in mental status, etc...   Reviewed expectations re: course of current medical issues. Questions answered. Outlined signs and symptoms indicating need for more acute intervention. Patient verbalized understanding. After Visit Summary given.          Lestine Box, PA-C 01/12/21 1511

## 2021-01-19 DIAGNOSIS — R490 Dysphonia: Secondary | ICD-10-CM | POA: Diagnosis not present

## 2021-03-13 DIAGNOSIS — Z08 Encounter for follow-up examination after completed treatment for malignant neoplasm: Secondary | ICD-10-CM | POA: Diagnosis not present

## 2021-03-13 DIAGNOSIS — Z85828 Personal history of other malignant neoplasm of skin: Secondary | ICD-10-CM | POA: Diagnosis not present

## 2021-03-13 DIAGNOSIS — L570 Actinic keratosis: Secondary | ICD-10-CM | POA: Diagnosis not present

## 2021-03-13 DIAGNOSIS — X32XXXD Exposure to sunlight, subsequent encounter: Secondary | ICD-10-CM | POA: Diagnosis not present

## 2021-03-13 DIAGNOSIS — L258 Unspecified contact dermatitis due to other agents: Secondary | ICD-10-CM | POA: Diagnosis not present

## 2021-03-27 DIAGNOSIS — Z430 Encounter for attention to tracheostomy: Secondary | ICD-10-CM | POA: Diagnosis not present

## 2021-04-06 ENCOUNTER — Other Ambulatory Visit: Payer: Self-pay

## 2021-04-06 ENCOUNTER — Telehealth: Payer: Self-pay | Admitting: *Deleted

## 2021-04-06 DIAGNOSIS — Z1329 Encounter for screening for other suspected endocrine disorder: Secondary | ICD-10-CM

## 2021-04-06 NOTE — Telephone Encounter (Signed)
CALLED PATIENT TO ASK ABOUT COMING FOR LABS ON 04-07-21, PATIENT AGREED TO COME ON 04-07-21 @ 2:45 PM

## 2021-04-07 ENCOUNTER — Ambulatory Visit
Admission: RE | Admit: 2021-04-07 | Discharge: 2021-04-07 | Disposition: A | Discharge status: Home / Self Care | Payer: Medicare HMO | Source: Ambulatory Visit | Attending: Radiation Oncology | Admitting: Radiation Oncology

## 2021-04-07 ENCOUNTER — Encounter: Payer: Self-pay | Admitting: Radiation Oncology

## 2021-04-07 ENCOUNTER — Other Ambulatory Visit: Payer: Self-pay

## 2021-04-07 VITALS — BP 91/76 | HR 70 | Temp 96.6°F | Resp 18 | Ht 63.0 in | Wt 115.1 lb

## 2021-04-07 DIAGNOSIS — Z08 Encounter for follow-up examination after completed treatment for malignant neoplasm: Secondary | ICD-10-CM | POA: Diagnosis not present

## 2021-04-07 DIAGNOSIS — M8580 Other specified disorders of bone density and structure, unspecified site: Secondary | ICD-10-CM | POA: Insufficient documentation

## 2021-04-07 DIAGNOSIS — Z79899 Other long term (current) drug therapy: Secondary | ICD-10-CM | POA: Insufficient documentation

## 2021-04-07 DIAGNOSIS — C320 Malignant neoplasm of glottis: Secondary | ICD-10-CM

## 2021-04-07 DIAGNOSIS — Z1329 Encounter for screening for other suspected endocrine disorder: Secondary | ICD-10-CM

## 2021-04-07 DIAGNOSIS — Z923 Personal history of irradiation: Secondary | ICD-10-CM | POA: Diagnosis not present

## 2021-04-07 DIAGNOSIS — Z8521 Personal history of malignant neoplasm of larynx: Secondary | ICD-10-CM | POA: Diagnosis not present

## 2021-04-07 DIAGNOSIS — Z85819 Personal history of malignant neoplasm of unspecified site of lip, oral cavity, and pharynx: Secondary | ICD-10-CM | POA: Diagnosis present

## 2021-04-07 LAB — TSH: TSH: 3.434 u[IU]/mL (ref 0.308–3.960)

## 2021-04-07 NOTE — Progress Notes (Signed)
Radiation Oncology         [phone] ________________________________  Name: Melody Ward MRN: [id number]  Date: 04/07/2021  DOB: 09-26-1950  Follow-Up Visit Note in person  CC: Erven Colla, DO  [name], MD  Diagnosis and Prior Radiotherapy:       ICD-10-CM   1. 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 Osteopenia, unspecified location  M85.80 TSH    3. Screening for hypothyroidism  Z13.29 TSH      Cancer Staging  Malignant neoplasm of glottis ([facility]) Staging form: Larynx - Glottis, AJCC 8th Edition - Pathologic stage from 04/03/2019: Stage III (pT3, pN1, cM0) - Signed by Eppie Gibson, MD on 04/06/2019 Stage prefix: Initial diagnosis - Clinical: No stage assigned - Unsigned   CHIEF COMPLAINT:  Here for follow-up and surveillance of laryngeal cancer  Narrative:     Melody Ward presents for follow up of radiation completed on 06/01/2019 to her neck  Pain issues, if any: Patient denies Using a feeding tube?: N/A Weight changes, if any:  Wt Readings from Last 3 Encounters:  04/07/21 115 lb 2 oz (52.2 kg)  09/06/20 112 lb (50.8 kg)  08/23/20 109 lb (49.4 kg)   Swallowing issues, if any: Had her esophagus restretched this past August. Reports she's noticing more leaking of liquids out her trach, but otherwise denies any issues/concerns (weight is stable) Smoking or chewing tobacco? None Using fluoride trays daily? N/A--sees her community dentist every 4 months Last ENT visit was on: 11/15/2020 Saw Dr. [name]  "Impression  --s/p laryngopharyngectomy, BND, and TEP, with a RFFF reconstruction ([name]) on 03/06/19 . Pathology with 2 cm tumor, negative margins, +PNI, no LVI, and 1/47 nodes ( 4 mm, left neck, no ENE). She underwent postop RT, completed 06/01/19 --There is no evidence of disease on exam today.  --She sees [name] in November. F/u with me in January , certainly sooner if there are problems or questions  --She is agreeable with  this plan. All her questions were answered."  Other notable issues, if any: Overall reports she feels well (has recovered since sustaining fall/head injury this past April). Currently her main concern is ongoing skin irritation around her trach site. Reports she's reached out to a dermatologist and her ENT, but plans to try and get established with a trach support group for more assistance   ALLERGIES:  has No Known Allergies.  Meds: Current Outpatient Medications  Medication Sig Dispense Refill   acetaminophen (TYLENOL) 500 MG tablet Take by mouth.     No current facility-administered medications for this encounter.    Physical Findings: The patient is in no acute distress. Patient is alert and oriented. Wt Readings from Last 3 Encounters:  04/07/21 115 lb 2 oz (52.2 kg)  09/06/20 112 lb (50.8 kg)  08/23/20 109 lb (49.4 kg)    height is 5\' 3"  (1.6 m) and weight is 115 lb 2 oz (52.2 kg). Her temporal temperature is 96.6 F (35.9 C) (abnormal). Her blood pressure is 91/76 and her pulse is 70. Her respiration is 18 and oxygen saturation is 100%. .   General: Alert and oriented, in no acute distress HEENT: Head is normocephalic. Extraocular movements are intact. Mouth clear. Oropharynx is notable for no mucositis or thrush. Stoma intact, without any lesions. Neck: No palpable lymphadenopathy  Skin: Skin in treatment fields shows satisfactory healing  Heart is regular in rate and rhythm Chest is clear to auscultation bilaterally  Abd: Soft, NT/ND Ext: no edema Psychiatric: Judgment and insight are intact. Affect is appropriate.    Lab Findings: Lab Results  Component Value Date   WBC 8.4 08/17/2020   HGB 12.3 08/17/2020   HCT 37.0 08/17/2020   MCV 95.6 08/17/2020   PLT 192 08/17/2020    Lab Results  Component Value Date   TSH 3.434 04/07/2021   CMP     Component Value Date/Time   NA 135 08/17/2020 0414   NA 143 03/29/2016 1429   K 3.7 08/17/2020 0414   CL 104  08/17/2020 0414   CO2 21 (L) 08/17/2020 0414   GLUCOSE 125 (H) 08/17/2020 0414   BUN 9 08/17/2020 0414   BUN 9 03/29/2016 1429   CREATININE 0.57 08/17/2020 0414   CREATININE 0.71 10/02/2019 1157   CALCIUM 8.2 (L) 08/17/2020 0414   PROT 6.6 08/17/2020 0414   PROT 7.1 03/29/2016 1429   ALBUMIN 3.7 08/17/2020 0414   ALBUMIN 4.8 03/29/2016 1429   AST 25 08/17/2020 0414   ALT 16 08/17/2020 0414   ALKPHOS 75 08/17/2020 0414   BILITOT 1.4 (H) 08/17/2020 0414   BILITOT 0.8 03/29/2016 1429   GFRNONAA >60 08/17/2020 0414   GFRNONAA >60 10/02/2019 1157   GFRAA >60 10/02/2019 1157    Radiographic Findings: No results found.   Impression/Plan:    1) Head and Neck Cancer Status: No evidence of disease   2) Nutritional Status: Stable  3) Risk Factors: The patient has been educated about risk factors including alcohol and tobacco abuse; they understand that avoidance of alcohol and tobacco is important to prevent recurrences as well as other cancers; abstaining   4) Swallowing: Functional, continue speech-language pathology exercises  5) Dental: Encouraged to continue regular followup with dentistry, and dental hygiene including fluoride treatments as recommended. She is following closely w/ dental.  6) Thyroid function: check annually; WNL today  Lab Results  Component Value Date   TSH 3.434 04/07/2021   7)  I will see her back in December 2023 and she will follow-up with otolaryngology in Jan.  On service date, in total, I spent 15 minutes on this encounter.  She was seen in person. _____________________________________   Eppie Gibson, MD

## 2021-04-07 NOTE — Progress Notes (Signed)
Ms. Baby presents for follow up of radiation completed on 06/01/2019 to her neck  Pain issues, if any: Patient denies Using a feeding tube?: N/A Weight changes, if any:  Wt Readings from Last 3 Encounters:  04/07/21 115 lb 2 oz (52.2 kg)  09/06/20 112 lb (50.8 kg)  08/23/20 109 lb (49.4 kg)   Swallowing issues, if any: Had her esophagus restretched this past August. Reports she's noticing more leaking of liquids out her trach, but otherwise denies any issues/concerns (weight is stabl) Smoking or chewing tobacco? None Using fluoride trays daily? N/A--sees her community dentist every 4 months Last ENT visit was on: 11/15/2020 Saw Dr. [name]  "Impression  --s/p laryngopharyngectomy, BND, and TEP, with a RFFF reconstruction ([name]) on 03/06/19 . Pathology with 2 cm tumor, negative margins, +PNI, no LVI, and 1/47 nodes ( 4 mm, left neck, no ENE). She underwent postop RT, completed 06/01/19 --There is no evidence of disease on exam today.  --She sees [name] in November. F/u with me in January , certainly sooner if there are problems or questions  --She is agreeable with this plan. All her questions were answered."  Other notable issues, if any: Overall reports she feels well (has recovered since sustaining fall/head injury this past April). Currently her main concern is ongoing skin irritation around her trach site. Reports she's reached out to a dermatologist and her ENT, but plans to try and get established with a trach support group for more assistance

## 2021-04-10 NOTE — Progress Notes (Signed)
Oncology Nurse Navigator Documentation   At Melody Ward recent follow up with [name], she was noted to be having recent difficulties with her Laryngectomy site including drainage, the cover "popping off" while speaking at times and skin breakdown around the stoma due to drainage. I contacted the provider who manages the trach clinic here at [facility] and he informed me that he does not have supplies needed to manage Laryngectomies and suggested Melody Ward contact her physicians at [facility] for assistance. I left a voicemail on Melody Ward phone with the above information and my direct contact information to call me back, if needed.   Harlow Asa RN, BSN, OCN Head & Neck Oncology Nurse [name] at [facility] Phone # [phone]  Fax # [phone]

## 2021-04-12 DIAGNOSIS — Z430 Encounter for attention to tracheostomy: Secondary | ICD-10-CM | POA: Diagnosis not present

## 2021-04-25 DIAGNOSIS — Z430 Encounter for attention to tracheostomy: Secondary | ICD-10-CM | POA: Diagnosis not present

## 2021-04-25 DIAGNOSIS — Z448 Encounter for fitting and adjustment of other external prosthetic devices: Secondary | ICD-10-CM | POA: Diagnosis not present

## 2021-05-17 DIAGNOSIS — R1310 Dysphagia, unspecified: Secondary | ICD-10-CM | POA: Diagnosis not present

## 2021-05-17 DIAGNOSIS — R1312 Dysphagia, oropharyngeal phase: Secondary | ICD-10-CM | POA: Diagnosis not present

## 2021-05-17 DIAGNOSIS — Z9002 Acquired absence of larynx: Secondary | ICD-10-CM | POA: Diagnosis not present

## 2021-05-17 DIAGNOSIS — K222 Esophageal obstruction: Secondary | ICD-10-CM | POA: Diagnosis not present

## 2021-05-23 DIAGNOSIS — Z08 Encounter for follow-up examination after completed treatment for malignant neoplasm: Secondary | ICD-10-CM | POA: Diagnosis not present

## 2021-05-23 DIAGNOSIS — Z9009 Acquired absence of other part of head and neck: Secondary | ICD-10-CM | POA: Diagnosis not present

## 2021-05-23 DIAGNOSIS — Z963 Presence of artificial larynx: Secondary | ICD-10-CM | POA: Diagnosis not present

## 2021-05-23 DIAGNOSIS — Z8521 Personal history of malignant neoplasm of larynx: Secondary | ICD-10-CM | POA: Diagnosis not present

## 2021-05-23 DIAGNOSIS — Z9002 Acquired absence of larynx: Secondary | ICD-10-CM | POA: Diagnosis not present

## 2021-05-23 DIAGNOSIS — Z923 Personal history of irradiation: Secondary | ICD-10-CM | POA: Diagnosis not present

## 2021-05-23 DIAGNOSIS — C329 Malignant neoplasm of larynx, unspecified: Secondary | ICD-10-CM | POA: Diagnosis not present

## 2021-06-22 DIAGNOSIS — R1313 Dysphagia, pharyngeal phase: Secondary | ICD-10-CM | POA: Diagnosis not present

## 2021-06-22 DIAGNOSIS — Z9002 Acquired absence of larynx: Secondary | ICD-10-CM | POA: Diagnosis not present

## 2021-06-22 DIAGNOSIS — Z9889 Other specified postprocedural states: Secondary | ICD-10-CM | POA: Diagnosis not present

## 2021-06-22 DIAGNOSIS — Z448 Encounter for fitting and adjustment of other external prosthetic devices: Secondary | ICD-10-CM | POA: Diagnosis not present

## 2021-06-22 DIAGNOSIS — R490 Dysphonia: Secondary | ICD-10-CM | POA: Diagnosis not present

## 2021-06-22 DIAGNOSIS — C329 Malignant neoplasm of larynx, unspecified: Secondary | ICD-10-CM | POA: Diagnosis not present

## 2021-06-22 DIAGNOSIS — R1310 Dysphagia, unspecified: Secondary | ICD-10-CM | POA: Diagnosis not present

## 2021-06-22 DIAGNOSIS — K222 Esophageal obstruction: Secondary | ICD-10-CM | POA: Diagnosis not present

## 2021-06-22 DIAGNOSIS — R1314 Dysphagia, pharyngoesophageal phase: Secondary | ICD-10-CM | POA: Diagnosis not present

## 2021-06-22 DIAGNOSIS — R6339 Other feeding difficulties: Secondary | ICD-10-CM | POA: Diagnosis not present

## 2021-06-22 DIAGNOSIS — Z8589 Personal history of malignant neoplasm of other organs and systems: Secondary | ICD-10-CM | POA: Diagnosis not present

## 2021-06-22 DIAGNOSIS — R1312 Dysphagia, oropharyngeal phase: Secondary | ICD-10-CM | POA: Diagnosis not present

## 2021-08-22 DIAGNOSIS — T85638A Leakage of other specified internal prosthetic devices, implants and grafts, initial encounter: Secondary | ICD-10-CM | POA: Diagnosis not present

## 2021-08-22 DIAGNOSIS — R490 Dysphonia: Secondary | ICD-10-CM | POA: Diagnosis not present

## 2021-08-22 DIAGNOSIS — Z85820 Personal history of malignant melanoma of skin: Secondary | ICD-10-CM | POA: Diagnosis not present

## 2021-08-22 DIAGNOSIS — L570 Actinic keratosis: Secondary | ICD-10-CM | POA: Diagnosis not present

## 2021-08-22 DIAGNOSIS — Y722 Prosthetic and other implants, materials and accessory otorhinolaryngological devices associated with adverse incidents: Secondary | ICD-10-CM | POA: Diagnosis not present

## 2021-08-22 DIAGNOSIS — S20462A Insect bite (nonvenomous) of left back wall of thorax, initial encounter: Secondary | ICD-10-CM | POA: Diagnosis not present

## 2021-08-22 DIAGNOSIS — Z4589 Encounter for adjustment and management of other implanted devices: Secondary | ICD-10-CM | POA: Diagnosis not present

## 2021-08-22 DIAGNOSIS — X32XXXD Exposure to sunlight, subsequent encounter: Secondary | ICD-10-CM | POA: Diagnosis not present

## 2021-08-22 DIAGNOSIS — Z85828 Personal history of other malignant neoplasm of skin: Secondary | ICD-10-CM | POA: Diagnosis not present

## 2021-08-22 DIAGNOSIS — Z08 Encounter for follow-up examination after completed treatment for malignant neoplasm: Secondary | ICD-10-CM | POA: Diagnosis not present

## 2021-08-22 DIAGNOSIS — D0461 Carcinoma in situ of skin of right upper limb, including shoulder: Secondary | ICD-10-CM | POA: Diagnosis not present

## 2021-09-05 DIAGNOSIS — H5200 Hypermetropia, unspecified eye: Secondary | ICD-10-CM | POA: Diagnosis not present

## 2021-09-05 DIAGNOSIS — Z0100 Encounter for examination of eyes and vision without abnormal findings: Secondary | ICD-10-CM | POA: Diagnosis not present

## 2021-09-19 DIAGNOSIS — Z8521 Personal history of malignant neoplasm of larynx: Secondary | ICD-10-CM | POA: Diagnosis not present

## 2021-09-19 DIAGNOSIS — C329 Malignant neoplasm of larynx, unspecified: Secondary | ICD-10-CM | POA: Diagnosis not present

## 2021-09-19 DIAGNOSIS — Z963 Presence of artificial larynx: Secondary | ICD-10-CM | POA: Diagnosis not present

## 2021-09-19 DIAGNOSIS — Z923 Personal history of irradiation: Secondary | ICD-10-CM | POA: Diagnosis not present

## 2021-09-19 DIAGNOSIS — Z08 Encounter for follow-up examination after completed treatment for malignant neoplasm: Secondary | ICD-10-CM | POA: Diagnosis not present

## 2021-09-19 DIAGNOSIS — Z9002 Acquired absence of larynx: Secondary | ICD-10-CM | POA: Diagnosis not present

## 2021-10-10 DIAGNOSIS — Z85820 Personal history of malignant melanoma of skin: Secondary | ICD-10-CM | POA: Diagnosis not present

## 2021-10-10 DIAGNOSIS — Z85828 Personal history of other malignant neoplasm of skin: Secondary | ICD-10-CM | POA: Diagnosis not present

## 2021-10-10 DIAGNOSIS — Z1283 Encounter for screening for malignant neoplasm of skin: Secondary | ICD-10-CM | POA: Diagnosis not present

## 2021-10-10 DIAGNOSIS — L570 Actinic keratosis: Secondary | ICD-10-CM | POA: Diagnosis not present

## 2021-10-10 DIAGNOSIS — Z08 Encounter for follow-up examination after completed treatment for malignant neoplasm: Secondary | ICD-10-CM | POA: Diagnosis not present

## 2021-10-10 DIAGNOSIS — L820 Inflamed seborrheic keratosis: Secondary | ICD-10-CM | POA: Diagnosis not present

## 2021-10-10 DIAGNOSIS — X32XXXD Exposure to sunlight, subsequent encounter: Secondary | ICD-10-CM | POA: Diagnosis not present

## 2021-11-16 DIAGNOSIS — Z430 Encounter for attention to tracheostomy: Secondary | ICD-10-CM | POA: Diagnosis not present

## 2021-12-06 DIAGNOSIS — Z430 Encounter for attention to tracheostomy: Secondary | ICD-10-CM | POA: Diagnosis not present

## 2021-12-07 DIAGNOSIS — Z963 Presence of artificial larynx: Secondary | ICD-10-CM | POA: Diagnosis not present

## 2021-12-07 DIAGNOSIS — K222 Esophageal obstruction: Secondary | ICD-10-CM | POA: Diagnosis not present

## 2021-12-07 DIAGNOSIS — Z85828 Personal history of other malignant neoplasm of skin: Secondary | ICD-10-CM | POA: Diagnosis not present

## 2021-12-07 DIAGNOSIS — C329 Malignant neoplasm of larynx, unspecified: Secondary | ICD-10-CM | POA: Diagnosis not present

## 2021-12-07 DIAGNOSIS — R490 Dysphonia: Secondary | ICD-10-CM | POA: Diagnosis not present

## 2021-12-07 DIAGNOSIS — R1312 Dysphagia, oropharyngeal phase: Secondary | ICD-10-CM | POA: Diagnosis not present

## 2021-12-14 DIAGNOSIS — H401121 Primary open-angle glaucoma, left eye, mild stage: Secondary | ICD-10-CM | POA: Diagnosis not present

## 2021-12-15 ENCOUNTER — Emergency Department (HOSPITAL_COMMUNITY): Payer: Medicare HMO

## 2021-12-15 ENCOUNTER — Emergency Department (HOSPITAL_COMMUNITY)
Admission: EM | Admit: 2021-12-15 | Discharge: 2021-12-15 | Disposition: A | Discharge status: Home / Self Care | Payer: Medicare HMO | Attending: Emergency Medicine | Admitting: Emergency Medicine

## 2021-12-15 DIAGNOSIS — M7989 Other specified soft tissue disorders: Secondary | ICD-10-CM | POA: Diagnosis not present

## 2021-12-15 DIAGNOSIS — S52001A Unspecified fracture of upper end of right ulna, initial encounter for closed fracture: Secondary | ICD-10-CM | POA: Diagnosis not present

## 2021-12-15 DIAGNOSIS — S42401A Unspecified fracture of lower end of right humerus, initial encounter for closed fracture: Secondary | ICD-10-CM | POA: Diagnosis not present

## 2021-12-15 DIAGNOSIS — W19XXXA Unspecified fall, initial encounter: Secondary | ICD-10-CM

## 2021-12-15 DIAGNOSIS — W010XXA Fall on same level from slipping, tripping and stumbling without subsequent striking against object, initial encounter: Secondary | ICD-10-CM | POA: Insufficient documentation

## 2021-12-15 DIAGNOSIS — S59901A Unspecified injury of right elbow, initial encounter: Secondary | ICD-10-CM | POA: Diagnosis present

## 2021-12-15 MED ORDER — HYDROCODONE-ACETAMINOPHEN 5-325 MG PO TABS: 1.0000 | ORAL_TABLET | ORAL | 0 refills | Status: DC | PRN

## 2021-12-15 NOTE — ED Notes (Signed)
ED Provider at bedside. 

## 2021-12-15 NOTE — ED Provider Triage Note (Signed)
Emergency Medicine Provider Triage Evaluation Note  Melody Ward , a 71 y.o. female  was evaluated in triage.  Pt complains of right elbow pain.  Patient was walking, she tripped and landed on her right elbow.  Did not land on her wrist, no pain to the shoulder or elsewhere.  Not on blood thinners.  Able to move her fingers, denies paresthesias..  Review of Systems  Per HPI  Physical Exam  BP (!) 164/119 (BP Location: Left Arm)   Pulse 75   Temp 98 F (36.7 C) (Oral)   Resp 15   Ht '5\' 3"'$  (1.6 m)   Wt 54.4 kg   SpO2 100%   BMI 21.26 kg/m  Gen:   Awake, no distress   Resp:  Normal effort  MSK:   Right elbow swollen and point tender.  Radial pulse 2+, cap refill less than 2.  Able to move range wrist and digits without difficulty.  No shoulder pain. Other:    Medical Decision Making  Medically screening exam initiated at 6:16 PM.  Appropriate orders placed.  Melody Ward was informed that the remainder of the evaluation will be completed by another provider, this initial triage assessment does not replace that evaluation, and the importance of remaining in the ED until their evaluation is complete.     Sherrill Raring, PA-C 12/15/21 1816

## 2021-12-15 NOTE — ED Provider Notes (Signed)
[facility] Provider Note   CSN: [id number] Arrival date & time: 12/15/21  1703     History  Chief Complaint  Patient presents with   Fall   Arm Injury    Right     Melody Ward is a 71 y.o. female.   Patient status post fall around 1700.  Tripped over an extension cord while trying to help her mother.  She fell backwards striking her right elbow pain to that area.  Did not hit her head.  No loss of consciousness.  No concerns for any other injuries past injury to the right wrist.  But no new injury to that.  Lower extremities fine hips fine back fine.  Patient not on blood thinners.  Past medical history significant for melanoma and patient has a total laryngectomy.  Patient is a non-smoker.       Home Medications Prior to Admission medications   Medication Sig Start Date End Date Taking? Authorizing Provider  HYDROcodone-acetaminophen (NORCO/VICODIN) 5-325 MG tablet Take 1 tablet by mouth every 4 (four) hours as needed. 12/15/21  Yes Fredia Sorrow, MD  acetaminophen (TYLENOL) 500 MG tablet Take by mouth. 03/13/19   [provider]      Allergies    Patient has no known allergies.    Review of Systems   Review of Systems  Constitutional:  Negative for chills and fever.  HENT:  Negative for ear pain and sore throat.   Eyes:  Negative for pain and visual disturbance.  Respiratory:  Negative for cough and shortness of breath.   Cardiovascular:  Negative for chest pain and palpitations.  Gastrointestinal:  Negative for abdominal pain and vomiting.  Genitourinary:  Negative for dysuria and hematuria.  Musculoskeletal:  Positive for joint swelling. Negative for arthralgias and back pain.  Skin:  Negative for color change and rash.  Neurological:  Negative for seizures and syncope.  All other systems reviewed and are negative.   Physical Exam Updated Vital Signs BP (!) 164/119 (BP Location: Left Arm)   Pulse 75   Temp 98 F (36.7 C)  (Oral)   Resp 15   Ht 1.6 m ('5\' 3"'$ )   Wt 54.4 kg   SpO2 100%   BMI 21.26 kg/m  Physical Exam Vitals and nursing note reviewed.  Constitutional:      General: She is not in acute distress.    Appearance: Normal appearance. She is well-developed.  HENT:     Head: Normocephalic and atraumatic.  Eyes:     Extraocular Movements: Extraocular movements intact.     Conjunctiva/sclera: Conjunctivae normal.     Pupils: Pupils are equal, round, and reactive to light.  Neck:     Comments: Anterior neck with tracheotomy with filter sealed over the opening. Cardiovascular:     Rate and Rhythm: Normal rate and regular rhythm.     Heart sounds: No murmur heard. Pulmonary:     Effort: Pulmonary effort is normal. No respiratory distress.     Breath sounds: Normal breath sounds.  Abdominal:     Palpations: Abdomen is soft.     Tenderness: There is no abdominal tenderness.  Musculoskeletal:        General: Swelling, tenderness and deformity present.     Cervical back: Normal range of motion and neck supple.     Right lower leg: No edema.     Left lower leg: No edema.     Comments: Swelling to the right elbow.  Tenderness to  palpation.  Shoulder without any deformities.  Good range of motion of the shoulder good range of motion at the wrist.  Some mild chronic swelling to the right wrist.  Good cap refill of the fingers good movement of fingers sensation intact.  And radial pulses 2+.  Other extremities without any evidence of any injury.  Skin:    General: Skin is warm and dry.     Capillary Refill: Capillary refill takes less than 2 seconds.  Neurological:     General: No focal deficit present.     Mental Status: She is alert and oriented to person, place, and time.  Psychiatric:        Mood and Affect: Mood normal.     ED Results / Procedures / Treatments   Labs (all labs ordered are listed, but only abnormal results are displayed) Labs Reviewed - No data to  display  EKG None  Radiology DG Elbow Complete Right  Result Date: 12/15/2021 CLINICAL DATA:  Status post fall. EXAM: RIGHT ELBOW - COMPLETE 3+ VIEW COMPARISON:  None Available. FINDINGS: An acute fracture deformity is seen extending through the olecranon process of the proximal right ulna. There is extension to involve the ulnohumeral joint, with approximately 8 mm displacement of the proximal and distal fracture sites. There is no evidence of dislocation. Moderate to marked severity posterior soft tissue swelling is seen. IMPRESSION: 1. Acute fracture of the proximal right ulna. 2. Moderate to marked severity posterior soft tissue swelling. Electronically Signed   By: Virgina Norfolk M.D.   On: 12/15/2021 18:13    Procedures Procedures    Medications Ordered in ED Medications - No data to display  ED Course/ Medical Decision Making/ A&P                           Medical Decision Making Amount and/or Complexity of Data Reviewed Radiology: ordered.  Risk Prescription drug management.   Patient will be splinted with a long-arm splint and sugar-tong.  With a sling.  We will follow-up with orthopedics she is fine with following with orthopedics locally.  Dr. Gordy Clement is on-call for orthopedics.  They will call on Monday.  Patient will be given a prepack of hydrocodone.  Otherwise she will take Motrin and extra strength Tylenol will not take the extra strength Tylenol with the hydrocodone.  Patient with isolated proximal olecranon fracture.  It is closed.  No open wounds.  Neurovascularly distally intact.   Final Clinical Impression(s) / ED Diagnoses Final diagnoses:  Fall, initial encounter  Closed fracture of right elbow, initial encounter    Rx / DC Orders ED Discharge Orders          Ordered    HYDROcodone-acetaminophen (NORCO/VICODIN) 5-325 MG tablet  Every 4 hours PRN        12/15/21 2247              Fredia Sorrow, MD 12/15/21 2254

## 2021-12-15 NOTE — ED Triage Notes (Signed)
Pt to ED via POV C/O fall. Not on blood thinners. No LOC. Pt fell on right arm, and now c/o right elbow pain.

## 2021-12-15 NOTE — Discharge Instructions (Signed)
Keep the splint dry.  Wear the sling to help support the weight of the splint.  On Monday call orthopedics for follow-up office information provided above.  Take the hydrocodone as needed for severe pain prepack provided tonight.  Would also recommend Motrin and extra strength Tylenol.  But while you are taking the hydrocodone you cannot take extra strength Tylenol with that.  But the combination of Motrin and extra strength Tylenol is often very helpful with bone pain.  Elevate the right arm is much as possible when sleeping.  Put a pillow under it.

## 2021-12-18 ENCOUNTER — Other Ambulatory Visit: Payer: Self-pay

## 2021-12-18 ENCOUNTER — Encounter: Payer: Self-pay | Admitting: Orthopedic Surgery

## 2021-12-18 ENCOUNTER — Ambulatory Visit: Payer: Medicare HMO | Admitting: Orthopedic Surgery

## 2021-12-18 VITALS — BP 120/80 | HR 77 | Ht 63.0 in | Wt 120.0 lb

## 2021-12-18 DIAGNOSIS — S52001A Unspecified fracture of upper end of right ulna, initial encounter for closed fracture: Secondary | ICD-10-CM

## 2021-12-18 MED ORDER — OXYCODONE HCL 5 MG PO TABS: 5.0000 mg | ORAL_TABLET | Freq: Four times a day (QID) | ORAL | 0 refills | Status: DC | PRN

## 2021-12-18 MED FILL — Hydrocodone-Acetaminophen Tab 5-325 MG: ORAL | Qty: 6 | Status: AC

## 2021-12-18 NOTE — Patient Instructions (Signed)
We will send a referral to Dr. Marlou Sa in [place]  Medication sent to the pharmacy  Elevate your hand as much as possible  Please let me know if you have any issues getting an appointment getting scheduled

## 2021-12-18 NOTE — Progress Notes (Signed)
New Patient Visit  Assessment: Melody Ward is a 71 y.o. female with the following: 1. Closed fracture of proximal end of right ulna, unspecified fracture morphology, initial encounter   Plan: [place] sustained a right olecranon fracture, which is displaced and I have recommended operative fixation.  The procedure was discussed in great detail.  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  It was suggested that she have definitive treatment in a facility where ENT is readily available, just in case there are issues.  After discussing this with the patient, she is comfortable with a referral to [facility], and to see Dr. Marlou Sa.  Have also reached out to Dr. Marlou Sa directly, and he is aware, and will continue to make arrangements to see her in an urgent fashion.  I have provided her with an updated prescription for pain medications.  She should use the sling as needed.  Keep the splint clean, dry and intact.  If she needs anything else, I have asked her to contact clinic.  Follow-up: Return for With Dr. Marlou Sa in [place].  Subjective:  Chief Complaint  Patient presents with   New Patient (Initial Visit)   Elbow Injury    Fx RT elbow/ulna DOI 12/15/21 Tripped and fell    History of Present Illness: Melody Ward is a 71 y.o. female who presents for evaluation of right elbow pain.  She states that she tripped and fell couple of days ago.  She subsequently presented to [facility] for evaluation.  Radiographs in the emergency department demonstrated a fracture of the olecranon.  She was placed in a long-arm splint, and presents to clinic today for discussion regarding further management.  Pain has been controlled with hydrocodone.  She has been using a sling, but states this is cumbersome and not comfortable.  Of note, she has been treated for throat cancer, and had a laryngectomy, with  some follow-up complications.  Subsequently, she uses a prosthesis to assist with speaking.   Review of Systems: No fevers or chills No numbness or tingling No chest pain No shortness of breath No bowel or bladder dysfunction No GI distress No headaches   Medical History:  Past Medical History:  Diagnosis Date   melanoma 26    Past Surgical History:  Procedure Laterality Date   CESAREAN SECTION     FREE FLAP RADIAL FOREARM  03/06/2019   Dr. Nicolette Bang [place]  03/06/2019   NECK DISSECTION  03/06/2019   bilateral neck dissection   NERVE GRAFT Left 03/06/2019   left arm lower, Dr. Nicolette Bang at [facility]   PHARYNGECTOMY  03/06/2019   Dr. Nicolette Bang at [place]  03/06/2019   Dr. Nicolette Bang at [place]  03/06/2019   pharyngoplasty, voice prosthesis/TEP placement, Dr. Nicolette Bang [place]  06/06/2018   ORIF distal radius fracture    Family History  Problem Relation Age of Onset   Hypertension Mother    Lung cancer Father    Social History   Tobacco Use   Smoking status: Never   Smokeless tobacco: Never  Vaping Use   Vaping Use: Never used  Substance Use Topics   Alcohol use: Yes    Comment: wine occ   Drug use: Not Currently    No Known Allergies  Current Meds  Medication Sig   acetaminophen (TYLENOL)  500 MG tablet Take by mouth.   oxyCODONE (ROXICODONE) 5 MG immediate release tablet Take 1 tablet (5 mg total) by mouth every 6 (six) hours as needed for up to 7 days.   [DISCONTINUED] HYDROcodone-acetaminophen (NORCO/VICODIN) 5-325 MG tablet Take 1 tablet by mouth every 4 (four) hours as needed.    Objective: BP 120/80   Pulse 77   Ht '5\' 3"'$  (1.6 m)   Wt 120 lb (54.4 kg)   BMI 21.26 kg/m   Physical Exam:  General: Alert and oriented. and No acute distress. Gait: Normal gait.  Right arm is in a posterior slab splint.  It is clean, dry and intact.  Fingers are warm and well-perfused.   Diffuse swelling to her hand.  Active motion intact in the AIN/PIN/U nerve distribution.  Sensation is intact throughout the right hand.  2+ radial pulse.  IMAGING: I personally reviewed images previously obtained from the ED  X-rays in the emergency department demonstrates a displaced fracture of the olecranon, with an additional oblique fracture line distal to the elbow joint.   New Medications:  Meds ordered this encounter  Medications   oxyCODONE (ROXICODONE) 5 MG immediate release tablet    Sig: Take 1 tablet (5 mg total) by mouth every 6 (six) hours as needed for up to 7 days.    Dispense:  20 tablet    Refill:  0      Mordecai Rasmussen, MD  12/18/2021 10:23 PM

## 2021-12-19 ENCOUNTER — Ambulatory Visit (HOSPITAL_COMMUNITY)
Admission: RE | Admit: 2021-12-19 | Discharge: 2021-12-19 | Disposition: A | Discharge status: Home / Self Care | Payer: Medicare HMO | Source: Ambulatory Visit | Attending: Orthopedic Surgery | Admitting: Orthopedic Surgery

## 2021-12-19 DIAGNOSIS — S52001A Unspecified fracture of upper end of right ulna, initial encounter for closed fracture: Secondary | ICD-10-CM | POA: Diagnosis not present

## 2021-12-19 DIAGNOSIS — M778 Other enthesopathies, not elsewhere classified: Secondary | ICD-10-CM | POA: Diagnosis not present

## 2021-12-19 DIAGNOSIS — M7989 Other specified soft tissue disorders: Secondary | ICD-10-CM | POA: Diagnosis not present

## 2021-12-20 ENCOUNTER — Encounter: Payer: Self-pay | Admitting: Orthopedic Surgery

## 2021-12-20 ENCOUNTER — Encounter (HOSPITAL_COMMUNITY): Payer: Self-pay | Admitting: Orthopedic Surgery

## 2021-12-20 ENCOUNTER — Ambulatory Visit: Payer: Medicare HMO | Admitting: Orthopedic Surgery

## 2021-12-20 DIAGNOSIS — S52001A Unspecified fracture of upper end of right ulna, initial encounter for closed fracture: Secondary | ICD-10-CM | POA: Diagnosis not present

## 2021-12-20 NOTE — Anesthesia Preprocedure Evaluation (Signed)
Anesthesia Evaluation Anesthesia Physical Anesthesia Plan  ASA:   Anesthesia Plan:    Post-op Pain Management:    Induction:   PONV Risk Score and Plan:   Airway Management Planned:   Additional Equipment:   Intra-op Plan:   Post-operative Plan:   Informed Consent:   Plan Discussed with:   Anesthesia Plan Comments: (History of squamous cell carcinoma of the larynx. S/p total laryngectomy, left partial pharyngectomy, bilateral neck dissection (levels 2-3), cricopharyngeal myotomy, and left free flap radial forearm, tracheoesophageal puncture (TEP) on 03/07/19. Developed neopharyngeal stricture/dysphagia, s/p transnasal/esophageal endoscopy, balloon dilation 18->20 mm with Dr. Rowe Clack 05/17/21. Prior dilations 11/30/20, 09/01/19, 07/14/19.   Last ENT visit with Dr. Rowe Clack was on 12/07/21. Overall doing well. Mild voice improvement following balloon dilation of residual UES on 05/17/21. No dilation at that time. TEP changed per speech therapist (ST).  Per 12/07/21 [facility] ST visit:  Current TEP Type: Provox Vega 20 fr Current TEP Size: 6 mm Handsfree: Flexivoice medium and light Current stoma care: [facility] Tracheostoma support: Provox Life stability and standard with Flexivoice medium vs light, 8/36 fenestrated ring tube  )        Anesthesia Quick Evaluation

## 2021-12-20 NOTE — Progress Notes (Signed)
Office Visit Note   Patient: Melody Ward           Date of Birth: 01/17/1951           MRN: [id number] Visit Date: 12/20/2021 Requested by: Mordecai Rasmussen, MD [phone],  [place] [address] PCP: Pcp, No  Subjective: Chief Complaint  Patient presents with   Right Elbow - Pain    HPI: Hand is a 71 year old right-hand-dominant patient with right elbow injury sustained 5 days ago.  Patient had a fall and sustained a displaced proximal ulnar fracture.  CT scan has been obtained.  Patient has a history of laryngectomy and has a tracheostomy type device in her neck.  She denies any other orthopedic complaints.            ROS: All systems reviewed are negative as they relate to the chief complaint within the history of present illness.  Patient denies  fevers or chills.   Assessment & Plan: Visit Diagnoses:  1. Closed fracture of proximal end of right ulna, unspecified fracture morphology, initial encounter     Plan: Impression is displaced comminuted proximal ulna fracture with olecranon component.  Patient is very active.  Skin is intact in that elbow region.  Expected amount of swelling is present.  EPL FPL interosseous function intact with palpable radial pulse.  Plan at this time is open reduction internal fixation of the fracture.  There is a coronoid piece which will require lag screw fixation through the plate.  The risk and benefits of surgical intervention are discussed including not limited to infection or vessel damage elbow stiffness as well as progressive arthritis developing in the elbow.  Patient understands risk benefits and wishes to proceed.  Also the risk of heterotopic ossification discussed.  Would like to achieve stable enough fixation to initiate early motion within a week after surgery but that really depends on the quality of fixation as well as bone quality.  All questions answered.  Did discuss with the anesthesiologist the nuances of anesthesia in this  particular patient.  We would aim for supine position with a bump under the affected right shoulder.  Endotracheal tube placement could be performed through the tracheostomy if necessary.  Follow-Up Instructions: No follow-ups on file.   Orders:  No orders of the defined types were placed in this encounter.  No orders of the defined types were placed in this encounter.     Procedures: No procedures performed   Clinical Data: No additional findings.  Objective: Vital Signs: There were no vitals taken for this visit.  Physical Exam:   Constitutional: Patient appears well-developed HEENT:  Head: Normocephalic Eyes:EOM are normal Neck: Normal range of motion Cardiovascular: Normal rate Pulmonary/chest: Effort normal Neurologic: Patient is alert Skin: Skin is warm Psychiatric: Patient has normal mood and affect   Ortho Exam: Ortho exam demonstrates swelling around the right elbow region.  EPL FPL interosseous function intact.  Radial pulse intact.  Radiographs demonstrate comminuted proximal ulna fracture with 3 main fragments.  Specialty Comments:  No specialty comments available.  Imaging: No results found.   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 Nicolette Bang [place]  03/06/2019   NECK DISSECTION  03/06/2019   bilateral neck dissection   NERVE GRAFT Left 03/06/2019   left arm lower, Dr. Nico[facility] Bang at [facility]   PHARYNGECTOMY  03/06/2019   Dr. Nicolette Bang at [place]  03/06/2019   Dr. Nicolette Bang at [place]  03/06/2019   pharyngoplasty, voice prosthesis/TEP placement, Dr. Nicolette Bang [place]  06/06/2018   ORIF distal radius fracture   Social History   Occupational History   Not on file  Tobacco Use   Smoking status: Never   Smokeless tobacco: Never  Vaping Use   Vaping Use: Never used  Substance and Sexual Activity   Alcohol use: Yes    Comment: wine occ   Drug use: Not Currently   Sexual activity: Not on file

## 2021-12-20 NOTE — Progress Notes (Signed)
SDW CALL  Patient was given pre-op instructions over the phone. The opportunity was given for the patient to ask questions. No further questions asked. Patient verbalized understanding of instructions given.   PCP - n/a Cardiologist - n/a  PPM/ICD - n/a   Chest x-ray - n/a EKG - 2022 Stress Test - n/a ECHO - 4/22 Cardiac Cath - n/a  Sleep Study - denies    Blood Thinner Instructions: n/a    COVID TEST- n/a   Anesthesia review: discussed voice prosthesis with Melody Ward  Patient denies shortness of breath, fever, cough and chest pain over the phone call   All instructions explained to the patient, with a verbal understanding of the material. Patient agrees to go over the instructions while at home for a better understanding.    Patient stated that she had not met with Melody Ward yet but has an appointment scheduled for 1430.  She also stated that she would attempt to come by and pick up CHG soap.

## 2021-12-20 NOTE — Progress Notes (Signed)
Anesthesia Chart Review: Melody Ward  Case: [id number] Date/Time: 12/21/21 1616   Procedure: OPEN REDUCTION INTERNAL FIXATION (ORIF) RIGHT ULNAR FRACTURE (Right)   Anesthesia type: General   Pre-op diagnosis: RIGHT ULNA FRACTURE   Location: [place] OR ROOM 07 / [place] OR   Surgeons: Meredith Pel, MD       DISCUSSION: Patient is a 71 year old female scheduled for the above procedure.   History of squamous cell carcinoma of the larynx. S/p total laryngectomy, left partial pharyngectomy, bilateral neck dissection (levels 2-3), cricopharyngeal myotomy, and left free flap radial forearm, tracheoesophageal puncture (TEP) on 03/07/19. Developed neopharyngeal stricture/dysphagia, s/p transnasal/esophageal endoscopy, balloon dilation 18->20 mm with Dr. Rowe Clack 05/17/21. Prior dilations 11/30/20, 09/01/19, 07/14/19.   Last ENT visit with Dr. Rowe Clack was on 12/07/21. Overall doing well. Mild voice improvement following balloon dilation of residual UES on 05/17/21. No dilation at that time. TEP changed per speech therapist (ST).   Per 12/07/21 [facility] visit for tracheoesophageal prosthesis (TEP) assessment and management (evaluate for leak).  "Current TEP Type: Provox Vega 20 fr Current TEP Size: 6 mm Handsfree: Flexivoice medium and light Current stoma care: [facility] Tracheostoma support: Provox Life stability and standard with Flexivoice medium vs light, 8/36 fenestrated ring tube... Assessment: Appearance of the device in the tract was adequate. Leakage through with testing. The removed device did not have the appearance of biofilm buildup, and the tracheal flange was not curled forward. The tract was resized at 6 mm. A 20 fr 6 mm Provox Vega was placed in the tract using gel cap without difficulty. Post change voicing was excellent/baseline, and there was not leakage through or around with drinks of water. Fit was appropriate."  She is a same-day work-up, so anesthesia team to evaluate on  the day of surgery.   VS:  BP Readings from Last 3 Encounters:  12/18/21 120/80  12/15/21 (!) 155/90  04/07/21 91/76   Pulse Readings from Last 3 Encounters:  12/18/21 77  12/15/21 74  04/07/21 70     PROVIDERS: Pcp, No   LABS: For day of surgery as indicated.  IMAGES: CT Right Elbow 12/19/21: IMPRESSION: 1. Acute comminuted fracture of the proximal ulna, as described above. 2. Slight irregularity of the tip of the coronoid process may be degenerative or posttraumatic. 3. Small elbow joint hemarthrosis. 4. Prominent soft tissue swelling with ill-defined hemorrhage at the posterior aspect of the elbow and proximal forearm.   EKG: 08/16/2020: Sinus arrhythmia   CV: Echo 08/17/20: IMPRESSIONS   1. Left ventricular ejection fraction, by estimation, is 60 to 65%. The  left ventricle has normal function. The left ventricle has no regional  wall motion abnormalities. Left ventricular diastolic parameters were  normal.   2. Right ventricular systolic function is normal. The right ventricular  size is normal. There is normal pulmonary artery systolic pressure. The  estimated right ventricular systolic pressure is 88.5 mmHg.   3. A small pericardial effusion is present. The pericardial effusion is  anterior to the right ventricle.   4. The mitral valve is normal in structure. Mild mitral valve  regurgitation. No evidence of mitral stenosis.   5. The aortic valve is tricuspid. Aortic valve regurgitation is not  visualized. No aortic stenosis is present.   6. The inferior vena cava is normal in size with greater than 50%  respiratory variability, suggesting right atrial pressure of 3 mmHg.    Past Medical History:  Diagnosis Date   melanoma 41  Past Surgical History:  Procedure Laterality Date   CESAREAN SECTION     FREE FLAP RADIAL FOREARM  03/06/2019   Dr. Nicolette Bang [facility]   MODIFIED RADICAL NECK DISSECTION  03/06/2019   NECK DISSECTION  03/06/2019   bilateral  neck dissection   NERVE GRAFT Left 03/06/2019   left arm lower, Dr. Nicolette Bang at [facility]   PHARYNGECTOMY  03/06/2019   Dr. Nicolette Bang at [place]  03/06/2019   Dr. Nicolette Bang at [place]  03/06/2019   pharyngoplasty, voice prosthesis/TEP placement, Dr. Nicolette Bang [place]  06/06/2018   ORIF distal radius fracture    MEDICATIONS: No current facility-administered medications for this encounter.    ibuprofen (ADVIL) 200 MG tablet   oxyCODONE (ROXICODONE) 5 MG immediate release tablet    Myra Gianotti, PA-C Surgical Short Stay/Anesthesiology [facility] Phone [phone] [facility] Phone [phone] 12/20/2021 4:04 PM

## 2021-12-21 ENCOUNTER — Ambulatory Visit (HOSPITAL_COMMUNITY): Payer: Medicare HMO

## 2021-12-21 ENCOUNTER — Encounter (HOSPITAL_COMMUNITY)
Admission: RE | Disposition: A | Discharge status: Home / Self Care | Payer: Self-pay | Source: Home / Self Care | Attending: Orthopedic Surgery

## 2021-12-21 ENCOUNTER — Other Ambulatory Visit: Payer: Self-pay

## 2021-12-21 ENCOUNTER — Ambulatory Visit (HOSPITAL_BASED_OUTPATIENT_CLINIC_OR_DEPARTMENT_OTHER): Payer: Medicare HMO | Admitting: Physician Assistant

## 2021-12-21 ENCOUNTER — Ambulatory Visit (HOSPITAL_COMMUNITY): Payer: Medicare HMO | Admitting: Physician Assistant

## 2021-12-21 ENCOUNTER — Ambulatory Visit (HOSPITAL_COMMUNITY)
Admission: RE | Admit: 2021-12-21 | Discharge: 2021-12-21 | Disposition: A | Discharge status: Home / Self Care | Payer: Medicare HMO | Attending: Orthopedic Surgery | Admitting: Orthopedic Surgery

## 2021-12-21 ENCOUNTER — Encounter (HOSPITAL_COMMUNITY): Payer: Self-pay | Admitting: Orthopedic Surgery

## 2021-12-21 DIAGNOSIS — Z930 Tracheostomy status: Secondary | ICD-10-CM | POA: Diagnosis not present

## 2021-12-21 DIAGNOSIS — Z9002 Acquired absence of larynx: Secondary | ICD-10-CM | POA: Diagnosis not present

## 2021-12-21 DIAGNOSIS — Z8521 Personal history of malignant neoplasm of larynx: Secondary | ICD-10-CM | POA: Insufficient documentation

## 2021-12-21 DIAGNOSIS — W19XXXA Unspecified fall, initial encounter: Secondary | ICD-10-CM | POA: Insufficient documentation

## 2021-12-21 DIAGNOSIS — S52031A Displaced fracture of olecranon process with intraarticular extension of right ulna, initial encounter for closed fracture: Secondary | ICD-10-CM

## 2021-12-21 DIAGNOSIS — S52031D Displaced fracture of olecranon process with intraarticular extension of right ulna, subsequent encounter for closed fracture with routine healing: Secondary | ICD-10-CM

## 2021-12-21 DIAGNOSIS — S52001A Unspecified fracture of upper end of right ulna, initial encounter for closed fracture: Secondary | ICD-10-CM | POA: Diagnosis not present

## 2021-12-21 DIAGNOSIS — S52021A Displaced fracture of olecranon process without intraarticular extension of right ulna, initial encounter for closed fracture: Secondary | ICD-10-CM | POA: Insufficient documentation

## 2021-12-21 DIAGNOSIS — Z4789 Encounter for other orthopedic aftercare: Secondary | ICD-10-CM | POA: Diagnosis not present

## 2021-12-21 DIAGNOSIS — Z01818 Encounter for other preprocedural examination: Secondary | ICD-10-CM

## 2021-12-21 HISTORY — PX: ORIF ULNAR FRACTURE: SHX5417

## 2021-12-21 LAB — POCT I-STAT, CHEM 8
BUN: 16 mg/dL (ref 8–23)
Calcium, Ion: 0.84 mmol/L — CL (ref 1.15–1.40)
Chloride: 112 mmol/L — ABNORMAL HIGH (ref 98–111)
Creatinine, Ser: 0.6 mg/dL (ref 0.44–1.00)
Glucose, Bld: 92 mg/dL (ref 70–99)
HCT: 45 % (ref 36.0–46.0)
Hemoglobin: 15.3 g/dL — ABNORMAL HIGH (ref 12.0–15.0)
Potassium: 3.7 mmol/L (ref 3.5–5.1)
Sodium: 137 mmol/L (ref 135–145)
TCO2: 20 mmol/L — ABNORMAL LOW (ref 22–32)

## 2021-12-21 SURGERY — OPEN REDUCTION INTERNAL FIXATION (ORIF) ULNAR FRACTURE
Anesthesia: Monitor Anesthesia Care | Site: Arm Upper | Laterality: Right

## 2021-12-21 MED ORDER — PROPOFOL 500 MG/50ML IV EMUL
INTRAVENOUS | Status: DC | PRN
  Administered 2021-12-21: 120 ug/kg/min via INTRAVENOUS
  Administered 2021-12-21: 60 ug/kg/min via INTRAVENOUS

## 2021-12-21 MED ORDER — LACTATED RINGERS IV SOLN: INTRAVENOUS | Status: DC

## 2021-12-21 MED ORDER — OXYCODONE HCL 5 MG/5ML PO SOLN: 5.0000 mg | Freq: Once | ORAL | Status: DC | PRN

## 2021-12-21 MED ORDER — FENTANYL CITRATE (PF) 100 MCG/2ML IJ SOLN: 25.0000 ug | INTRAMUSCULAR | Status: DC | PRN

## 2021-12-21 MED ORDER — OXYCODONE HCL 5 MG PO TABS: 5.0000 mg | ORAL_TABLET | Freq: Once | ORAL | Status: DC | PRN

## 2021-12-21 MED ORDER — ACETAMINOPHEN 10 MG/ML IV SOLN
INTRAVENOUS | Status: DC | PRN
  Administered 2021-12-21: 1000 mg via INTRAVENOUS

## 2021-12-21 MED ORDER — ROPIVACAINE HCL 5 MG/ML IJ SOLN
INTRAMUSCULAR | Status: DC | PRN
  Administered 2021-12-21: 25 mL via PERINEURAL

## 2021-12-21 MED ORDER — DEXAMETHASONE SODIUM PHOSPHATE 10 MG/ML IJ SOLN
INTRAMUSCULAR | Status: DC | PRN
  Administered 2021-12-21: 10 mg

## 2021-12-21 MED ORDER — FENTANYL CITRATE PF 50 MCG/ML IJ SOSY
50.0000 ug | PREFILLED_SYRINGE | Freq: Once | INTRAMUSCULAR | Status: AC
  Administered 2021-12-21: 50 ug via INTRAVENOUS

## 2021-12-21 MED ORDER — VANCOMYCIN HCL 1000 MG IV SOLR
INTRAVENOUS | Status: AC
Start: 2021-12-21 — End: ?
  Filled 2021-12-21: qty 20

## 2021-12-21 MED ORDER — PROPOFOL 10 MG/ML IV BOLUS
INTRAVENOUS | Status: DC | PRN
  Administered 2021-12-21: 50 mg via INTRAVENOUS
  Administered 2021-12-21: 40 mg via INTRAVENOUS

## 2021-12-21 MED ORDER — FENTANYL CITRATE (PF) 250 MCG/5ML IJ SOLN
INTRAMUSCULAR | Status: DC | PRN
  Administered 2021-12-21: 50 ug via INTRAVENOUS
  Administered 2021-12-21 (×2): 25 ug via INTRAVENOUS

## 2021-12-21 MED ORDER — CHLORHEXIDINE GLUCONATE 0.12 % MT SOLN: 15.0000 mL | Freq: Once | OROMUCOSAL | Status: DC

## 2021-12-21 MED ORDER — LIDOCAINE 2% (20 MG/ML) 5 ML SYRINGE
INTRAMUSCULAR | Status: DC | PRN
  Administered 2021-12-21: 40 mg via INTRAVENOUS

## 2021-12-21 MED ORDER — MIDAZOLAM HCL 2 MG/2ML IJ SOLN
1.0000 mg | Freq: Once | INTRAMUSCULAR | Status: AC
Start: 2021-12-21 — End: 2021-12-21

## 2021-12-21 MED ORDER — ORAL CARE MOUTH RINSE: 15.0000 mL | Freq: Once | OROMUCOSAL | Status: DC

## 2021-12-21 MED ORDER — MIDAZOLAM HCL 2 MG/2ML IJ SOLN
INTRAMUSCULAR | Status: AC
  Administered 2021-12-21: 1 mg via INTRAVENOUS
  Filled 2021-12-21: qty 2

## 2021-12-21 MED ORDER — PHENYLEPHRINE 80 MCG/ML (10ML) SYRINGE FOR IV PUSH (FOR BLOOD PRESSURE SUPPORT)
PREFILLED_SYRINGE | INTRAVENOUS | Status: DC | PRN
  Administered 2021-12-21: 80 ug via INTRAVENOUS

## 2021-12-21 MED ORDER — 0.9 % SODIUM CHLORIDE (POUR BTL) OPTIME
TOPICAL | Status: DC | PRN
  Administered 2021-12-21: 3000 mL
  Administered 2021-12-21: 1000 mL

## 2021-12-21 MED ORDER — CEFAZOLIN SODIUM-DEXTROSE 2-4 GM/100ML-% IV SOLN
2.0000 g | INTRAVENOUS | Status: AC
  Administered 2021-12-21: 2 g via INTRAVENOUS
  Filled 2021-12-21: qty 100

## 2021-12-21 MED ORDER — OXYCODONE-ACETAMINOPHEN 5-325 MG PO TABS: 1.0000 | ORAL_TABLET | ORAL | 0 refills | Status: AC | PRN

## 2021-12-21 MED ORDER — CHLORHEXIDINE GLUCONATE 0.12 % MT SOLN
OROMUCOSAL | Status: AC
  Filled 2021-12-21: qty 15

## 2021-12-21 MED ORDER — FENTANYL CITRATE (PF) 100 MCG/2ML IJ SOLN
INTRAMUSCULAR | Status: AC
  Filled 2021-12-21: qty 2

## 2021-12-21 MED ORDER — METHOCARBAMOL 500 MG PO TABS: 500.0000 mg | ORAL_TABLET | Freq: Three times a day (TID) | ORAL | 1 refills | Status: AC | PRN

## 2021-12-21 MED ORDER — ACETAMINOPHEN 500 MG PO TABS: 1000.0000 mg | ORAL_TABLET | Freq: Once | ORAL | Status: DC

## 2021-12-21 MED ORDER — ONDANSETRON HCL 4 MG/2ML IJ SOLN
INTRAMUSCULAR | Status: DC | PRN
  Administered 2021-12-21: 4 mg via INTRAVENOUS

## 2021-12-21 MED ORDER — POVIDONE-IODINE 7.5 % EX SOLN
Freq: Once | CUTANEOUS | Status: DC
  Filled 2021-12-21: qty 118

## 2021-12-21 MED ORDER — VANCOMYCIN HCL 1000 MG IV SOLR
INTRAVENOUS | Status: DC | PRN
  Administered 2021-12-21: 1000 mg via TOPICAL

## 2021-12-21 MED ORDER — PHENYLEPHRINE 80 MCG/ML (10ML) SYRINGE FOR IV PUSH (FOR BLOOD PRESSURE SUPPORT)
PREFILLED_SYRINGE | INTRAVENOUS | Status: AC
  Filled 2021-12-21: qty 10

## 2021-12-21 MED ORDER — IBUPROFEN 200 MG PO TABS: 600.0000 mg | ORAL_TABLET | Freq: Three times a day (TID) | ORAL | 0 refills | Status: AC | PRN

## 2021-12-21 MED ORDER — FENTANYL CITRATE (PF) 250 MCG/5ML IJ SOLN
INTRAMUSCULAR | Status: AC
  Filled 2021-12-21: qty 5

## 2021-12-21 MED ORDER — TRANEXAMIC ACID-NACL 1000-0.7 MG/100ML-% IV SOLN
1000.0000 mg | INTRAVENOUS | Status: AC
  Administered 2021-12-21: 1000 mg via INTRAVENOUS

## 2021-12-21 MED ORDER — ACETAMINOPHEN 10 MG/ML IV SOLN
INTRAVENOUS | Status: AC
  Filled 2021-12-21: qty 100

## 2021-12-21 MED ORDER — ONDANSETRON HCL 4 MG/2ML IJ SOLN: 4.0000 mg | Freq: Once | INTRAMUSCULAR | Status: DC | PRN

## 2021-12-21 MED ORDER — PHENYLEPHRINE HCL-NACL 20-0.9 MG/250ML-% IV SOLN
INTRAVENOUS | Status: DC | PRN
  Administered 2021-12-21: 30 ug/min via INTRAVENOUS

## 2021-12-21 MED ORDER — POVIDONE-IODINE 10 % EX SWAB: 2.0000 | Freq: Once | CUTANEOUS | Status: DC

## 2021-12-21 MED ORDER — TRANEXAMIC ACID-NACL 1000-0.7 MG/100ML-% IV SOLN
INTRAVENOUS | Status: AC
  Filled 2021-12-21: qty 100

## 2021-12-21 MED ORDER — CEPHALEXIN 500 MG PO CAPS: ORAL_CAPSULE | ORAL | 0 refills | Status: AC

## 2021-12-21 MED ORDER — ACETAMINOPHEN 500 MG PO TABS
ORAL_TABLET | ORAL | Status: AC
  Filled 2021-12-21: qty 2

## 2021-12-21 MED ORDER — LIDOCAINE 2% (20 MG/ML) 5 ML SYRINGE
INTRAMUSCULAR | Status: AC
  Filled 2021-12-21: qty 5

## 2021-12-21 SURGICAL SUPPLY — 96 items
ADAPTER K-WIRE FAST GUIDE 2.0 (WIRE) IMPLANT
ADPR GD 2X3.5 BSHNG KRSH (WIRE) ×2
BAG COUNTER SPONGE SURGICOUNT (BAG) ×1 IMPLANT
BAG SPNG CNTER NS LX DISP (BAG) ×1
BANDAGE ACE 4X5 VEL STRL LF (GAUZE/BANDAGES/DRESSINGS) IMPLANT
BIT DRILL 2.5X2.75 QC CALB (BIT) IMPLANT
BIT DRILL CALIBRATED 2.7 (BIT) IMPLANT
BLADE SURG 10 STRL SS (BLADE) ×1 IMPLANT
BNDG CMPR 5X4 CHSV STRCH STRL (GAUZE/BANDAGES/DRESSINGS) ×1
BNDG CMPR 9X4 STRL LF SNTH (GAUZE/BANDAGES/DRESSINGS)
BNDG COHESIVE 4X5 TAN STRL LF (GAUZE/BANDAGES/DRESSINGS) IMPLANT
BNDG ELASTIC 4X5.8 VLCR STR LF (GAUZE/BANDAGES/DRESSINGS) ×1 IMPLANT
BNDG ESMARK 4X9 LF (GAUZE/BANDAGES/DRESSINGS) IMPLANT
BNDG GAUZE DERMACEA FLUFF 4 (GAUZE/BANDAGES/DRESSINGS) IMPLANT
BNDG GZE DERMACEA 4 6PLY (GAUZE/BANDAGES/DRESSINGS)
CLOSURE STERI STRIP 1/2 X4 (GAUZE/BANDAGES/DRESSINGS) IMPLANT
CORD BIPOLAR FORCEPS 12FT (ELECTRODE) ×1 IMPLANT
COVER MAYO STAND STRL (DRAPES) IMPLANT
COVER SURGICAL LIGHT HANDLE (MISCELLANEOUS) ×1 IMPLANT
CUFF TOURN SGL QUICK 18X4 (TOURNIQUET CUFF) ×1 IMPLANT
DRAIN TLS ROUND 10FR (DRAIN) IMPLANT
DRAPE C-ARM 42X72 X-RAY (DRAPES) IMPLANT
DRAPE INCISE IOBAN 66X45 STRL (DRAPES) IMPLANT
DRAPE U-SHAPE 47X51 STRL (DRAPES) ×1 IMPLANT
DRILL SLEEVE 2.7 DIST TIB (TRAUMA) ×1
DRSG AQUACEL AG ADV 3.5X10 (GAUZE/BANDAGES/DRESSINGS) IMPLANT
DRSG PAD ABDOMINAL 8X10 ST (GAUZE/BANDAGES/DRESSINGS) IMPLANT
DURAPREP 26ML APPLICATOR (WOUND CARE) ×1 IMPLANT
ELECT REM PT RETURN 9FT ADLT (ELECTROSURGICAL) ×1
ELECTRODE REM PT RTRN 9FT ADLT (ELECTROSURGICAL) ×1 IMPLANT
GAUZE SPONGE 4X4 12PLY STRL (GAUZE/BANDAGES/DRESSINGS) IMPLANT
GAUZE XEROFORM 1X8 LF (GAUZE/BANDAGES/DRESSINGS) IMPLANT
GLOVE BIO SURGEON STRL SZ 6.5 (GLOVE) IMPLANT
GLOVE BIO SURGEON STRL SZ8 (GLOVE) IMPLANT
GLOVE BIOGEL PI IND STRL 6.5 (GLOVE) IMPLANT
GLOVE BIOGEL PI IND STRL 7.5 (GLOVE) IMPLANT
GLOVE BIOGEL PI IND STRL 8 (GLOVE) ×1 IMPLANT
GLOVE BIOGEL PI INDICATOR 6.5 (GLOVE) ×1
GLOVE BIOGEL PI INDICATOR 7.5 (GLOVE) ×1
GLOVE BIOGEL PI INDICATOR 8 (GLOVE) ×1
GLOVE SURG SYN 7.5  E (GLOVE) ×1
GLOVE SURG SYN 7.5 E (GLOVE) ×1 IMPLANT
GLOVE SURG SYN 7.5 PF PI (GLOVE) IMPLANT
GOWN STRL REIN XL XLG (GOWN DISPOSABLE) IMPLANT
GOWN STRL REUS W/ TWL LRG LVL3 (GOWN DISPOSABLE) ×2 IMPLANT
GOWN STRL REUS W/ TWL XL LVL3 (GOWN DISPOSABLE) ×1 IMPLANT
GOWN STRL REUS W/TWL LRG LVL3 (GOWN DISPOSABLE) ×1
GOWN STRL REUS W/TWL XL LVL3 (GOWN DISPOSABLE) ×1
K-WIRE ACE 1.6X6 (WIRE) ×6
KIT BASIN OR (CUSTOM PROCEDURE TRAY) ×1 IMPLANT
KIT TURNOVER KIT B (KITS) ×1 IMPLANT
KWIRE ACE 1.6X6 (WIRE) IMPLANT
MANIFOLD NEPTUNE II (INSTRUMENTS) ×1 IMPLANT
NEEDLE 22X1 1/2 (OR ONLY) (NEEDLE) IMPLANT
NS IRRIG 1000ML POUR BTL (IV SOLUTION) ×1 IMPLANT
PACK ORTHO EXTREMITY (CUSTOM PROCEDURE TRAY) ×1 IMPLANT
PAD ARMBOARD 7.5X6 YLW CONV (MISCELLANEOUS) ×2 IMPLANT
PAD CAST 3X4 CTTN HI CHSV (CAST SUPPLIES) ×1 IMPLANT
PAD CAST 4YDX4 CTTN HI CHSV (CAST SUPPLIES) ×1 IMPLANT
PADDING CAST COTTON 3X4 STRL (CAST SUPPLIES) ×1
PADDING CAST COTTON 4X4 STRL (CAST SUPPLIES) ×2
PLATE OLECRANON LRG (Plate) IMPLANT
SCREW LOCK 3.5X30 DIST TIB (Screw) IMPLANT
SCREW LOCK CORT STAR 3.5X10 (Screw) IMPLANT
SCREW LOCK CORT STAR 3.5X14 (Screw) IMPLANT
SCREW LOCK CORT STAR 3.5X18 (Screw) IMPLANT
SCREW LOCK CORT STAR 3.5X20 (Screw) IMPLANT
SCREW LOCK CORT STAR 3.5X24 (Screw) IMPLANT
SCREW LOCK CORT STAR 3.5X38 (Screw) IMPLANT
SCREW LP 3.5 (Screw) IMPLANT
SCREW NON LOCKING LP 3.5 16MM (Screw) IMPLANT
SLEEVE DRILL 2.7 DIST TIB (TRAUMA) IMPLANT
SPLINT PLASTER CAST XFAST 5X30 (CAST SUPPLIES) IMPLANT
STAPLER VISISTAT 35W (STAPLE) IMPLANT
STOCKINETTE IMPERVIOUS LG (DRAPES) IMPLANT
STRIP CLOSURE SKIN 1/2X4 (GAUZE/BANDAGES/DRESSINGS) ×1 IMPLANT
SUCTION FRAZIER HANDLE 10FR (MISCELLANEOUS) ×1
SUCTION TUBE FRAZIER 10FR DISP (MISCELLANEOUS) ×1 IMPLANT
SUT ETHILON 3 0 PS 1 (SUTURE) IMPLANT
SUT MNCRL AB 3-0 PS2 18 (SUTURE) IMPLANT
SUT PROLENE 3 0 PS 1 (SUTURE) IMPLANT
SUT VIC AB 0 CT1 27 (SUTURE) ×5
SUT VIC AB 0 CT1 27XBRD ANBCTR (SUTURE) IMPLANT
SUT VIC AB 1 CT1 36 (SUTURE) IMPLANT
SUT VIC AB 2-0 CT1 27 (SUTURE) ×2
SUT VIC AB 2-0 CT1 TAPERPNT 27 (SUTURE) IMPLANT
SUT VIC AB 2-0 CTB1 (SUTURE) IMPLANT
SUT VIC AB 3-0 X1 27 (SUTURE) IMPLANT
SYR CONTROL 10ML LL (SYRINGE) IMPLANT
SYSTEM CHEST DRAIN TLS 7FR (DRAIN) IMPLANT
TOWEL GREEN STERILE (TOWEL DISPOSABLE) ×1 IMPLANT
TOWEL GREEN STERILE FF (TOWEL DISPOSABLE) ×1 IMPLANT
TUBE CONNECTING 12X1/4 (SUCTIONS) ×1 IMPLANT
WASHER 3.5MM (Orthopedic Implant) IMPLANT
WATER STERILE IRR 1000ML POUR (IV SOLUTION) ×1 IMPLANT
YANKAUER SUCT BULB TIP NO VENT (SUCTIONS) IMPLANT

## 2021-12-21 NOTE — OR Nursing (Signed)
Care of patient assumed at 1905. 

## 2021-12-21 NOTE — Anesthesia Procedure Notes (Signed)
Procedure Name: MAC Date/Time: 12/21/2021 4:40 PM  Performed by: Lorie Phenix, CRNAPre-anesthesia Checklist: Patient identified, Emergency Drugs available, Suction available and Patient being monitored Patient Re-evaluated:Patient Re-evaluated prior to induction Oxygen Delivery Method: Simple face mask Placement Confirmation: positive ETCO2 Comments: O2 via face mask placed over stoma.

## 2021-12-21 NOTE — Transfer of Care (Signed)
Immediate Anesthesia Transfer of Care Note  Patient: Melody Ward  Procedure(s) Performed: OPEN REDUCTION INTERNAL FIXATION (ORIF) RIGHT PROXIMAL ULNAR FRACTURE (Right: Arm Upper)  Patient Location: PACU  Anesthesia Type:MAC combined with regional for post-op pain  Level of Consciousness: awake  Airway & Oxygen Therapy: Patient Spontanous Breathing  Post-op Assessment: Report given to RN and Post -op Vital signs reviewed and stable  Post vital signs: Reviewed and stable  Last Vitals:  Vitals Value Taken Time  BP 116/62 12/21/21 1937  Temp    Pulse 78 12/21/21 1938  Resp 14 12/21/21 1938  SpO2 96 % 12/21/21 1938  Vitals shown include unvalidated device data.  Last Pain:  Vitals:   12/21/21 1447  PainSc: 0-No pain      Patients Stated Pain Goal: 0 (03/88/82 8003)  Complications: No notable events documented.

## 2021-12-21 NOTE — Anesthesia Procedure Notes (Signed)
Anesthesia Regional Block: Supraclavicular block   Pre-Anesthetic Checklist: , timeout performed,  Correct Patient, Correct Site, Correct Laterality,  Correct Procedure, Correct Position, site marked,  Risks and benefits discussed,  Surgical consent,  Pre-op evaluation,  At surgeon's request and post-op pain management  Laterality: Right  Prep: Maximum Sterile Barrier Precautions used, chloraprep       Needles:  Injection technique: Single-shot  Needle Type: Echogenic Stimulator Needle     Needle Length: 9cm  Needle Gauge: 22     Additional Needles:   Procedures:,,,, ultrasound used (permanent image in chart),,    Narrative:  Start time: 12/21/2021 3:50 PM End time: 12/21/2021 3:55 PM Injection made incrementally with aspirations every 5 mL.  Performed by: Personally  Anesthesiologist: Pervis Hocking, DO  Additional Notes: Monitors applied. No increased pain on injection. No increased resistance to injection. Injection made in 5cc increments. Good needle visualization. Patient tolerated procedure well.

## 2021-12-21 NOTE — Progress Notes (Signed)
CRITICAL RESULT PROVIDER NOTIFICATION  Test performed and critical result:  Istat, iCa 0.84   Date and time result received:  12/21/21 1509  Provider name/title: Dr. Doroteo Glassman  Date and time provider notified: 12/21/21 1515  Date and time provider responded: 12/21/21 1515  Provider response:No new orders

## 2021-12-21 NOTE — H&P (Signed)
Melody Ward is an 71 y.o. female.   Chief Complaint: Right arm pain HPI: Melody Ward is a 71 year old right-hand-dominant patient with right elbow injury sustained 5 days ago.  Patient had a fall and sustained a displaced proximal ulnar fracture.  CT scan has been obtained.  Elbow is located patient has a history of laryngectomy and has a tracheostomy type device in her neck.  She denies any other orthopedic complaints.   Comminuted fracture of the proximal ulna is present with displacement of the olecranon approximately         Past Medical History:  Diagnosis Date   melanoma 1990    Past Surgical History:  Procedure Laterality Date   CESAREAN SECTION     FREE FLAP RADIAL FOREARM  03/06/2019   Dr. Nicolette Bang [facility]   MODIFIED RADICAL NECK DISSECTION  03/06/2019   NECK DISSECTION  03/06/2019   bilateral neck dissection   NERVE GRAFT Left 03/06/2019   left arm lower, Dr. Nicolette Bang at [facility]   PHARYNGECTOMY  03/06/2019   Dr. Nicolette Bang at [place]  03/06/2019   Dr. Nicolette Bang at [place]  03/06/2019   pharyngoplasty, voice prosthesis/TEP placement, Dr. Nicolette Bang [place]  06/06/2018   ORIF distal radius fracture    Family History  Problem Relation Age of Onset   Hypertension Mother    Lung cancer Father    Social History:  reports that she has never smoked. She has never used smokeless tobacco. She reports current alcohol use. She reports that she does not currently use drugs.  Allergies: No Known Allergies  Medications Prior to Admission  Medication Sig Dispense Refill   ibuprofen (ADVIL) 200 MG tablet Take 400 mg by mouth every 6 (six) hours as needed for moderate pain.     oxyCODONE (ROXICODONE) 5 MG immediate release tablet Take 1 tablet (5 mg total) by mouth every 6 (six) hours as needed for up to 7 days. 20 tablet 0    Results for orders placed or performed during the hospital encounter of 12/21/21 (from the past 48 hour(s))  I-STAT, chem 8      Status: Abnormal   Collection Time: 12/21/21  3:09 PM  Result Value Ref Range   Sodium 137 135 - 145 mmol/L   Potassium 3.7 3.5 - 5.1 mmol/L   Chloride 112 (H) 98 - 111 mmol/L   BUN 16 8 - 23 mg/dL   Creatinine, Ser 0.60 0.44 - 1.00 mg/dL   Glucose, Bld 92 70 - 99 mg/dL    Comment: Glucose reference range applies only to samples taken after fasting for at least 8 hours.   Calcium, Ion 0.84 (LL) 1.15 - 1.40 mmol/L   TCO2 20 (L) 22 - 32 mmol/L   Hemoglobin 15.3 (H) 12.0 - 15.0 g/dL   HCT 45.0 36.0 - 46.0 %   Comment NOTIFIED PHYSICIAN    No results found.  Review of Systems  Musculoskeletal:  Positive for arthralgias.  All other systems reviewed and are negative.   Blood pressure 122/80, pulse 68, temperature 98 F (36.7 C), resp. rate 17, height '5\' 3"'$  (1.6 m), weight 54.4 kg, SpO2 98 %. Physical Exam Vitals reviewed.  HENT:     Head: Atraumatic.     Nose: Nose normal.     Mouth/Throat:     Mouth: Mucous membranes are moist.  Eyes:     Pupils: Pupils are equal, round, and reactive to light.  Cardiovascular:  Rate and Rhythm: Normal rate.     Pulses: Normal pulses.  Pulmonary:     Effort: Pulmonary effort is normal.  Abdominal:     General: Abdomen is flat.  Musculoskeletal:     Cervical back: Normal range of motion.  Skin:    General: Skin is warm.     Capillary Refill: Capillary refill takes less than 2 seconds.  Neurological:     General: No focal deficit present.     Mental Status: She is alert.  Psychiatric:        Mood and Affect: Mood normal.    Ortho exam demonstrates swelling around the right elbow region.  EPL FPL interosseous function intact.  Radial pulse intact.  Radiographs demonstrate comminuted proximal ulna fracture with 3 main fragments.  Skin intact with ecchymosis present.  Small abrasion noted just lateral to the olecranon.  Compartment soft.  Assessment/Plan  Impression is displaced comminuted proximal ulna fracture with olecranon  component.  Patient is very active.  Skin is intact in that elbow region.  Expected amount of swelling is present.  EPL FPL interosseous function intact with palpable radial pulse.  Plan at this time is open reduction internal fixation of the fracture.  There is a coronoid piece which will require lag screw fixation through the plate.  The risk and benefits of surgical intervention are discussed including not limited to infection or vessel damage elbow stiffness as well as progressive arthritis developing in the elbow.  Patient understands risk benefits and wishes to proceed.  Also the risk of heterotopic ossification discussed.  Would like to achieve stable enough fixation to initiate early motion within a week after surgery but that really depends on the quality of fixation as well as bone quality.  All questions answered.  Did discuss with the anesthesiologist the nuances of anesthesia in this particular patient.  We would aim for supine position with a bump under the affected right shoulder.  Endotracheal tube placement could be performed through the tracheostomy if necessary.  Anderson Malta, MD 12/21/2021, 4:16 PM

## 2021-12-22 ENCOUNTER — Ambulatory Visit: Payer: Medicare HMO | Admitting: Orthopedic Surgery

## 2021-12-22 NOTE — Brief Op Note (Signed)
   12/21/2021  11:06 PM  PATIENT:  Sol Blazing  71 y.o. female  PRE-OPERATIVE DIAGNOSIS:  RIGHT PROXIMAL ULNAR FRACTURE  POST-OPERATIVE DIAGNOSIS:  RIGHT PROXIMAL ULNAR FRACTURE  PROCEDURE:  Procedure(s): OPEN REDUCTION INTERNAL FIXATION (ORIF) RIGHT PROXIMAL ULNAR FRACTURE  SURGEON:  Surgeon(s): Meredith Pel, MD  ASSISTANT: magnant pa  ANESTHESIA:   general  EBL: 25 ml    Total I/O In: 800 [I.V.:700; IV Piggyback:100] Out: 25 [Blood:25]  BLOOD ADMINISTERED: none  DRAINS: none   LOCAL MEDICATIONS USED:  none  SPECIMEN:  No Specimen  COUNTS:  YES  TOURNIQUET:   Total Tourniquet Time Documented: Upper Arm (Right) - 99 minutes Total: Upper Arm (Right) - 99 minutes   DICTATION: .Other Dictation: Dictation Number pend   PLAN OF CARE: Discharge to home after PACU  PATIENT DISPOSITION:  PACU - hemodynamically stable

## 2021-12-22 NOTE — Anesthesia Postprocedure Evaluation (Signed)
Anesthesia Post Note  Patient: Melody Ward  Procedure(s) Performed: OPEN REDUCTION INTERNAL FIXATION (ORIF) RIGHT PROXIMAL ULNAR FRACTURE (Right: Arm Upper)     Patient location during evaluation: PACU Anesthesia Type: Regional Level of consciousness: awake and alert Pain management: pain level controlled Vital Signs Assessment: post-procedure vital signs reviewed and stable Respiratory status: spontaneous breathing, nonlabored ventilation and respiratory function stable Cardiovascular status: stable and blood pressure returned to baseline Anesthetic complications: no   No notable events documented.  Last Vitals:  Vitals:   12/21/21 2005 12/21/21 2020  BP: 127/72 124/80  Pulse: 76 75  Resp: 20 19  Temp:  36.7 C  SpO2: 97% 98%    Last Pain:  Vitals:   12/21/21 2005  PainSc: 0-No pain                 Audry Pili

## 2021-12-26 ENCOUNTER — Encounter (HOSPITAL_COMMUNITY): Payer: Self-pay | Admitting: Orthopedic Surgery

## 2021-12-26 NOTE — Op Note (Unsigned)
[name], Ward MEDICAL RECORD NO: [id number] ACCOUNT NO: [id number] DATE OF BIRTH: Jul 31, 1950 FACILITY: [facility] LOCATION: [facility] PHYSICIAN: Yetta Barre. Marlou Sa, MD  Operative Report   DATE OF PROCEDURE: 12/21/2021  PREOPERATIVE DIAGNOSIS:  Right proximal ulna fracture.  POSTOPERATIVE DIAGNOSIS:  Right proximal ulna fracture.  PROCEDURE:  Right proximal ulna fracture open reduction and internal fixation.  SURGEON:  Yetta Barre. Marlou Sa, MD  ASSISTANT:  Annie Main, PA.  INDICATIONS:  This is a 71 year old patient who fell 5 days ago and sustained a displaced olecranon and proximal ulna fracture.  She presents now for operative management after explanation of risks and benefits.  DESCRIPTION OF PROCEDURE:  The patient was brought to the operating room where general endotracheal anesthesia was induced.  Preoperative antibiotics administered.  Timeout was called.  The patient was placed supine on the operating room table on a  beanbag where we could elevate the right shoulder.  The right arm, shoulder and hand prescrubbed with alcohol and Betadine, allowed to air dry, prepped with DuraPrep solution and draped in sterile manner.  Ioban used to cover the operative field.  Right  arm was elevated and exsanguinated with the Esmarch wrap.  Tourniquet was inflated.  Total tourniquet time was 99 minutes. An incision made along the ulnar crest extending proximally.  Skin and subcutaneous tissue were sharply divided.  Fracture site was  identified.  Hematoma was removed and irrigated.  The subperiosteal elevation was performed on the proximal ulna.  This was performed on the medial side as well.  Care was taken to avoid injury to the ulnar nerve in terms of retraction.  The coronoid  split fragment was identified.  It was reduced and held in position with K-wires x2.  Next, the olecranon piece was reduced to the ulnar shaft fragment.  This was also held with K-wires x2.  Next, the plate was applied.   Reduction confirmed in the AP and  lateral planes under fluoroscopy.  There was some comminution and bone loss, but overall the construct appeared to have closed anatomic reduction to the normal anatomy.  One screw was placed in the shaft on the plate and then other screws were placed in  the more proximal aspect of the plate including the homerun screw.  Angles were adjusted.  Next, screws were placed in order to capture the coronally split coronoid process piece, which was large.  This was captured close to anatomic alignment.  Once  provisional fixation with several screws in the plate was obtained the reduction was checked under fluoroscopy.  The elbow was taken through range of motion and found to be stable. Screws were then placed in the proximal and distal aspect of the plate.   K-wires were removed.  The coronoid coronal split was stable.  Elbow range of motion also good with flexion and extension. Next thorough irrigation was performed.  Tourniquet was released and bleeding points encountered controlled using bipolar  electrocautery.  The triceps was split at its attachment site in order to countersink the proximal portion of the plate.  Overall, bone quality was good.  The vancomycin powder was placed and the soft tissue envelope was closed over the plate.  Skin was  then closed using interrupted inverted 0 Vicryl suture, 2-0 Vicryl suture, and 3-0 Monocryl with Steri-Strips and Aquacel dressing applied.  The patient tolerated the procedure well without immediate complications.  A well-padded posterior splint  applied.  Luke's assistance was required at all times during the case for retraction,  opening, closing, mobilization of tissue, placement of screws.  His assistance was a medical necessity.   PUS D: 12/26/2021 1:23:16 pm T: 12/26/2021 1:42:00 pm  JOB: [id number]/ [id number]

## 2021-12-26 NOTE — Brief Op Note (Signed)
   12/21/2021  1:18 PM  PATIENT:  Melody Ward  71 y.o. female  PRE-OPERATIVE DIAGNOSIS:  RIGHT PROXIMAL ULNAR FRACTURE  POST-OPERATIVE DIAGNOSIS:  RIGHT PROXIMAL ULNAR FRACTURE  PROCEDURE:  Procedure(s): OPEN REDUCTION INTERNAL FIXATION (ORIF) RIGHT PROXIMAL ULNAR FRACTURE  SURGEON:  Surgeon(s): Marlou Sa, Tonna Corner, MD  ASSISTANT: magnant pa  ANESTHESIA:   general  EBL: 50 ml    No intake/output data recorded.  BLOOD ADMINISTERED: none  DRAINS: none   LOCAL MEDICATIONS USED:  none  SPECIMEN:  No Specimen  COUNTS:  YES  TOURNIQUET:   Total Tourniquet Time Documented: Upper Arm (Right) - 99 minutes Total: Upper Arm (Right) - 99 minutes   DICTATION: .Other Dictation: Dictation Number [id number]  PLAN OF CARE: Discharge to home after PACU  PATIENT DISPOSITION:  PACU - hemodynamically stable

## 2021-12-28 ENCOUNTER — Ambulatory Visit (INDEPENDENT_AMBULATORY_CARE_PROVIDER_SITE_OTHER): Payer: Medicare HMO | Admitting: Surgical

## 2021-12-28 ENCOUNTER — Encounter: Payer: Self-pay | Admitting: Surgical

## 2021-12-28 ENCOUNTER — Ambulatory Visit: Payer: Self-pay

## 2021-12-28 DIAGNOSIS — S52001A Unspecified fracture of upper end of right ulna, initial encounter for closed fracture: Secondary | ICD-10-CM

## 2021-12-28 NOTE — Progress Notes (Signed)
Post-Op Visit Note   Patient: Melody Ward           Date of Birth: [birth year]           MRN: [id number] Visit Date: 12/28/2021 PCP: Pcp, No   Assessment & Plan:  Chief Complaint:  Chief Complaint  Patient presents with   Right Elbow - Routine Post Op    12/21/21 (7d) Open Reduction Internal Fixation (orif) Right Proximal Ulnar Fracture      Visit Diagnoses:  1. Closed fracture of proximal end of right ulna, unspecified fracture morphology, initial encounter     Plan: Patient is a 71 year old female who returns s/p ORIF of ulnar fracture on 12/21/2021.  Doing well overall.  Pain is controlled.  Not having to take any pain medication for the most part.  She has been in postop splint.  This was removed.  No complaint of fevers, chills, night sweats, chest pain, shortness of breath.  On exam, patient has intact EPL, FPL, finger abduction, finger adduction, pronation/supination, bicep, tricep, deltoid.  Axillary nerve intact with deltoid firing.  She has intact sensation to the fourth and fifth digits.  Incision looks to be healing well without evidence of infection or dehiscence.  Steri-Strips reapplied to the incision.  Radiographs today demonstrate fracture in good alignment with no significant change compared with intraoperative radiographs.  She has a little bit of complaint of numbness and tingling in the fourth and fifth fingers which I assured her is normal from the retraction during surgery.  Ulnar nerve is functioning with intact finger abduction on exam today.  Plan is using sling when she is out of her house and moving around her house.  She may start flexion of the elbow from 90 degrees to maximal flexion.  No lifting with the operative arm.  Follow-up with the office in 10 days for clinical recheck with Dr. Marlou Sa.  Call the office with any questions or concerns.  Did caution her against driving any significant distance.  Follow-Up Instructions: Return in about 10 days (around  01/07/2022).   Orders:  Orders Placed This Encounter  Procedures   XR Elbow 2 Views Right   No orders of the defined types were placed in this encounter.   Imaging: No results found.  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 Nicolette Bang [facility]   MODIFIED RADICAL NECK DISSECTION  03/06/2019   NECK DISSECTION  03/06/2019   bilateral neck dissection   NERVE GRAFT Left 03/06/2019   left arm lower, Dr. Nicolette Bang at [facility]   ORIF [place] Right 12/21/2021   Procedure: OPEN REDUCTION INTERNAL FIXATION (ORIF) RIGHT PROXIMAL ULNAR FRACTURE;  Surgeon: Meredith Pel, MD;  [facility]  OR;  Service: Orthopedics;  Laterality: Right;   PHARYNGECTOMY  03/06/2019   Dr. Nicolette Bang at [place]  03/06/2019   Dr. Nicolette Bang at [place]  03/06/2019   pharyngoplasty, voice prosthesis/TEP placement, Dr. Nicolette Bang [place]  06/06/2018   ORIF distal radius fracture   Social History   Occupational History   Not on file  Tobacco Use   Smoking status: Never   Smokeless tobacco: Never  Vaping Use   Vaping Use: Never used  Substance and Sexual Activity   Alcohol use: Yes    Comment: wine occ   Drug use: Not  Currently   Sexual activity: Not on file

## 2022-01-03 DIAGNOSIS — H25093 Other age-related incipient cataract, bilateral: Secondary | ICD-10-CM | POA: Diagnosis not present

## 2022-01-03 DIAGNOSIS — H40003 Preglaucoma, unspecified, bilateral: Secondary | ICD-10-CM | POA: Diagnosis not present

## 2022-01-05 ENCOUNTER — Ambulatory Visit (INDEPENDENT_AMBULATORY_CARE_PROVIDER_SITE_OTHER): Payer: Medicare HMO | Admitting: Surgical

## 2022-01-05 ENCOUNTER — Ambulatory Visit (INDEPENDENT_AMBULATORY_CARE_PROVIDER_SITE_OTHER): Payer: Medicare HMO

## 2022-01-05 ENCOUNTER — Encounter: Payer: Self-pay | Admitting: Surgical

## 2022-01-05 ENCOUNTER — Telehealth: Payer: Self-pay | Admitting: Orthopedic Surgery

## 2022-01-05 DIAGNOSIS — S52001A Unspecified fracture of upper end of right ulna, initial encounter for closed fracture: Secondary | ICD-10-CM

## 2022-01-05 NOTE — Progress Notes (Signed)
Post-Op Visit Note   Patient: Melody Ward           Date of Birth: [birth year]           MRN: [id number] Visit Date: 01/05/2022 PCP: Pcp, No   Assessment & Plan:  Chief Complaint:  Chief Complaint  Patient presents with   Right Elbow - Routine Post Op    ORIF of ulnar fracture on 12/21/2021   Visit Diagnoses:  1. Closed fracture of proximal end of right ulna, unspecified fracture morphology, initial encounter     Plan: Patient is a 71 year old female who presents s/p ORIF of right proximal ulnar fracture on 12/21/2021.  Doing well overall.  Pain is controlled.  She has no fevers or chills.  Not taking any medication for pain.  Compliant with no lifting.  She does not feel any coarseness or grinding in the joint when she is moving her elbow.  She does note some numbness and tingling in the fourth and fifth fingers consistent with the last visit.  No dysfunction of her hand that she has noticed.  On exam, patient has incision that is healing well without evidence of infection or dehiscence.  She has smooth range of motion without any coarseness noted with both pronation/supination and flexion/extension.  She has passive extension to about 30 degrees and passive flexion to about 100 degrees.  She has intact EPL, FPL, finger abduction, finger adduction, grip strength, wrist extension, bicep flexion, tricep extension.  2+ radial pulse of the operative extremity.  Plan is to continue with sling for 1 more week and then completely discontinue.  She is okay for as much extension of the elbow as she can tolerate.  She can do some active and passive flexion as well but do not really stress the flexion as much as extension.  Okay for pronation and supination.  No lifting with the operative arm.  Hold off on any driving for now.  Radiographs taken today demonstrate fracture that is well aligned with no significant change compared with prior radiographs or postop radiographs.  She has hardware intact  with no evidence of hardware failure.  Plan is to continue with these parameters and follow-up with Dr. Marlou Sa in clinic in 2 weeks for clinical recheck and new radiographs at that time.  She did have blood work taken today to check her vitamin D level to minimize the chance of nonunion.  Follow-Up Instructions: No follow-ups on file.   Orders:  Orders Placed This Encounter  Procedures   XR Elbow 2 Views Right   Vitamin D (25 hydroxy)   No orders of the defined types were placed in this encounter.   Imaging: No results found.  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 Nicolette Bang [place]  03/06/2019   NECK DISSECTION  03/06/2019   bilateral neck dissection   NERVE GRAFT Left 03/06/2019   left arm lower, Dr[facility]ico[facility] at [facility]   ORIF ULNAR FRACTURE Right 12/21/2021   Procedure: OPEN REDUCTION INTERNAL FIXATION (ORIF) RIGHT PROXIMAL ULNAR FRACTURE;  Surgeon: Meredith Pel, MD;  Location: [place];  Service: Orthopedics;  Laterality: Right;   PHARYNGECTOMY  03/06/2019   Dr. Nicolette Bang at [place]  03/06/2019   Dr. Nicolette Bang at [place]  03/06/2019   pharyngoplasty, voice  prosthesis/TEP placement, Dr. Nicolette Bang [place]  06/06/2018   ORIF distal radius fracture   Social History   Occupational History   Not on file  Tobacco Use   Smoking status: Never   Smokeless tobacco: Never  Vaping Use   Vaping Use: Never used  Substance and Sexual Activity   Alcohol use: Yes    Comment: wine occ   Drug use: Not Currently   Sexual activity: Not on file

## 2022-01-05 NOTE — Telephone Encounter (Signed)
scheduled

## 2022-01-05 NOTE — Telephone Encounter (Signed)
Pt was seen today and PA Lurena Joiner want pt to see Dr Marlou Sa in 2 wks but Dr Marlou Sa has no openings. Please call pt with a 2 wk follow up with Dr Marlou Sa. Pt phone number is [phone].

## 2022-01-06 LAB — VITAMIN D 25 HYDROXY (VIT D DEFICIENCY, FRACTURES): Vit D, 25-Hydroxy: 21 ng/mL — ABNORMAL LOW (ref 30–100)

## 2022-01-08 ENCOUNTER — Other Ambulatory Visit: Payer: Self-pay | Admitting: Surgical

## 2022-01-08 MED ORDER — VITAMIN D (ERGOCALCIFEROL) 1.25 MG (50000 UNIT) PO CAPS: 50000.0000 [IU] | ORAL_CAPSULE | ORAL | 0 refills | Status: DC

## 2022-01-08 NOTE — Progress Notes (Signed)
done

## 2022-01-08 NOTE — Progress Notes (Signed)
Good call pls supplement thx

## 2022-01-10 DIAGNOSIS — H25093 Other age-related incipient cataract, bilateral: Secondary | ICD-10-CM | POA: Diagnosis not present

## 2022-01-10 DIAGNOSIS — H401121 Primary open-angle glaucoma, left eye, mild stage: Secondary | ICD-10-CM | POA: Diagnosis not present

## 2022-01-14 DIAGNOSIS — S52031A Displaced fracture of olecranon process with intraarticular extension of right ulna, initial encounter for closed fracture: Secondary | ICD-10-CM

## 2022-01-16 ENCOUNTER — Encounter: Payer: Medicare HMO | Admitting: Surgical

## 2022-01-17 DIAGNOSIS — X32XXXD Exposure to sunlight, subsequent encounter: Secondary | ICD-10-CM | POA: Diagnosis not present

## 2022-01-17 DIAGNOSIS — Z85828 Personal history of other malignant neoplasm of skin: Secondary | ICD-10-CM | POA: Diagnosis not present

## 2022-01-17 DIAGNOSIS — L570 Actinic keratosis: Secondary | ICD-10-CM | POA: Diagnosis not present

## 2022-01-17 DIAGNOSIS — Z08 Encounter for follow-up examination after completed treatment for malignant neoplasm: Secondary | ICD-10-CM | POA: Diagnosis not present

## 2022-01-18 ENCOUNTER — Encounter: Payer: Self-pay | Admitting: Orthopedic Surgery

## 2022-01-18 ENCOUNTER — Ambulatory Visit (INDEPENDENT_AMBULATORY_CARE_PROVIDER_SITE_OTHER): Payer: Medicare HMO | Admitting: Orthopedic Surgery

## 2022-01-18 ENCOUNTER — Ambulatory Visit (INDEPENDENT_AMBULATORY_CARE_PROVIDER_SITE_OTHER): Payer: Medicare HMO

## 2022-01-18 DIAGNOSIS — S52001A Unspecified fracture of upper end of right ulna, initial encounter for closed fracture: Secondary | ICD-10-CM

## 2022-01-18 NOTE — Addendum Note (Signed)
Addended byLaurann Montana on: 01/18/2022 09:14 AM   Modules accepted: Orders

## 2022-01-18 NOTE — Progress Notes (Signed)
Post-Op Visit Note   Patient: Melody Ward           Date of Birth: [birth year]           MRN: [id number] Visit Date: 01/18/2022 PCP: Pcp, No   Assessment & Plan:  Chief Complaint:  Chief Complaint  Patient presents with   Right Elbow - Routine Post Op   Visit Diagnoses:  1. Closed fracture of proximal end of right ulna, unspecified fracture morphology, initial encounter     Plan: Linna Hoff is a 71 year old patient is now a month out right elbow proximal ulnar fracture open reduction internal fixation.  Still having some tingling in the small finger but the fourth finger tingling has improved.  On examination she has range of motion of 20-95 with good pronation and supination.  Incision intact.  Radiographs look good.  No heterotopic ossification.  Plan at this time is to really start focusing on elbow range of motion particularly flexion.  Would like for her to get about 10 to 15 degrees more flexion to become more functional.  Therapy 3 times a week.  Come back in 3 weeks for clinical recheck.  Radiographs at that time.  Follow-Up Instructions: No follow-ups on file.   Orders:  Orders Placed This Encounter  Procedures   XR Elbow 2 Views Right   No orders of the defined types were placed in this encounter.   Imaging: XR Elbow 2 Views Right  Result Date: 01/18/2022 AP lateral radiographs right elbow reviewed.  Proximal humeral fracture fixation in good position alignment with no complicating features.  Unchanged fracture gap of 2 to 3 mm of a fracture fragment captured by crossing screw.  No lucencies around the screws.  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 Nicolette Bang [facility]   MODIFIED RADICAL NECK DISSECTION  03/06/2019   NECK DISSECTION  03/06/2019   bilateral neck dissection   NERVE GRAFT Left 03/06/2019   left arm lower, Dr. Nicolette Bang at [facility]   ORIF ULNAR FRACTURE Right 12/21/2021   Procedure: OPEN REDUCTION INTERNAL FIXATION (ORIF) RIGHT PROXIMAL ULNAR FRACTURE;  Surgeon: Meredith Pel, MD;  Location: [place];  Service: Orthopedics;  Laterality: Right;   PHARYNGECTOMY  03/06/2019   Dr. Nicolette Bang at [place]  03/06/2019   Dr. Nicolette Bang at [place]  03/06/2019   pharyngoplasty, voice prosthesis/TEP placement, Dr. Nicolette Bang [place]  06/06/2018   ORIF distal radius fracture   Social History   Occupational History   Not on file  Tobacco Use  Smoking status: Never   Smokeless tobacco: Never  Vaping Use   Vaping Use: Never used  Substance and Sexual Activity   Alcohol use: Yes    Comment: wine occ   Drug use: Not Currently   Sexual activity: Not on file

## 2022-01-19 ENCOUNTER — Encounter: Payer: Medicare HMO | Admitting: Orthopedic Surgery

## 2022-01-23 DIAGNOSIS — H401111 Primary open-angle glaucoma, right eye, mild stage: Secondary | ICD-10-CM | POA: Diagnosis not present

## 2022-01-26 ENCOUNTER — Encounter (HOSPITAL_COMMUNITY): Payer: Self-pay | Admitting: Occupational Therapy

## 2022-01-26 ENCOUNTER — Ambulatory Visit (HOSPITAL_COMMUNITY): Payer: Medicare HMO | Attending: Orthopedic Surgery | Admitting: Occupational Therapy

## 2022-01-26 DIAGNOSIS — R29898 Other symptoms and signs involving the musculoskeletal system: Secondary | ICD-10-CM | POA: Diagnosis not present

## 2022-01-26 DIAGNOSIS — M25521 Pain in right elbow: Secondary | ICD-10-CM | POA: Diagnosis not present

## 2022-01-26 DIAGNOSIS — M25621 Stiffness of right elbow, not elsewhere classified: Secondary | ICD-10-CM | POA: Diagnosis not present

## 2022-01-26 NOTE — Therapy (Signed)
OUTPATIENT OCCUPATIONAL THERAPY ORTHO EVALUATION  Patient Name: Melody Ward MRN: [id number] DOB:1950-05-29, 71 y.o., female Today's Date: 01/26/2022  PCP: TBD REFERRING PROVIDER: Dr. Meredith Pel   OT End of Session - 01/26/22 1047     Visit Number 1    Number of Visits 8    Date for OT Re-Evaluation 02/25/22    Authorization Type [employer]    Authorization Time Period No visit limit    Progress Note Due on Visit 10    OT Start Time [phone]    OT Stop Time 1029    OT Time Calculation (min) 36 min    Activity Tolerance Patient tolerated treatment well    Behavior During Therapy [facility] for tasks assessed/performed             Past Medical History:  Diagnosis Date   melanoma 1990   Past Surgical History:  Procedure Laterality Date   CESAREAN SECTION     FREE FLAP RADIAL FOREARM  03/06/2019   Dr. Nicolette Bang [facility]   MODIFIED RADICAL NECK DISSECTION  03/06/2019   NECK DISSECTION  03/06/2019   bilateral neck dissection   NERVE GRAFT Left 03/06/2019   left arm lower, Dr. Nico[facility] Bang at [facility]   ORIF [place] Right 12/21/2021   Procedure: OPEN REDUCTION INTERNAL FIXATION (ORIF) RIGHT PROXIMAL ULNAR FRACTURE;  Surgeon: Meredith Pel, MD;  Location: [place];  Service: Orthopedics;  Laterality: Right;   PHARYNGECTOMY  03/06/2019   Dr. Nicolette Bang at [place]  03/06/2019   Dr. Nicolette Bang at [place]  03/06/2019   pharyngoplasty, voice prosthesis/TEP placement, Dr. 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  Pt accompanied by: self  PERTINENT HISTORY: Pt tripped over a drop cord and fractured her right proximal ulna, ORIF performed 12/21/21.   PRECAUTIONS: 1# lifting restriction  WEIGHT BEARING RESTRICTIONS Yes NWB  PAIN:  Are you having pain? No  FALLS: Has patient fallen in last 6 months? Yes. Number of falls 1   PLOF: Independent  PATIENT GOALS To improve ability to use RUE as dominant during ADLs.   OBJECTIVE:   HAND DOMINANCE: Right  ADLs: Overall ADLs: Pt reports she is unable to reach behind her head in er, she cannot touch her right shoulder. She needs both hands to replace the pad around her stoma and currently cannot use her right hand to assist. She has difficulty with washing hair, reaching behind back, reaching across to the left shoulder during dressing and grooming tasks.  FUNCTIONAL OUTCOME MEASURES: Quick Dash: 36.36  UPPER EXTREMITY ROM     Assessed seated, er/IR adducted  Active ROM Right eval  Shoulder internal rotation 90  Shoulder external rotation 40  Elbow flexion 83  Elbow extension -35  Wrist pronation 60  Wrist supination 90  (Blank rows = not tested)      Assessed supine    Passive ROM Right eval  Elbow flexion 104  Elbow extension -17  Wrist pronation 90  Wrist supination 90  (Blank rows = not tested)  UPPER EXTREMITY MMT:      Assessed seated, er/IR adducted  MMT Right eval  Shoulder internal  rotation 5/5  Shoulder external rotation 5/5  Elbow flexion 5/5  Elbow extension 3-/5  Wrist pronation 4+/5  Wrist supination 4+/5  (Blank rows = not tested)  HAND FUNCTION: Grip strength: Right: 38 lbs; Left: 48 lbs   SENSATION: Tingling along right ulnar nerve distribution-right 4th and 5th digits.   EDEMA: Mild to moderate edema along medial epicondyle region and proximal upper arm.   COGNITION: Overall cognitive status: Within functional limits for tasks assessed     PATIENT EDUCATION: Education details: elbow flexion/extension AA/ROM, A/ROM, forearm supination/pronation A/ROM Person educated: Patient Education method: Consulting civil engineer, Demonstration, and Handouts Education comprehension: verbalized understanding and returned demonstration   HOME EXERCISE PROGRAM: Eval: elbow flexion/extension AA/ROM, A/ROM, forearm supination/pronation A/ROM  GOALS: Goals reviewed with patient? Yes  SHORT TERM GOALS: Target date: 02/23/2022    Pt will be provided with and educated on HEP to improve mobility of RUE required for use as dominant during ADLs.   Goal status: INITIAL  2.  Pt will increase A/ROM of RUE by 30 degrees or more to improve ability to reach up to her shoulder and neck to perform hygiene and grooming tasks.    Goal status: INITIAL  3.  Pt will increase strength in RUE to 4+/5 throughout to improve ability to lift pots, pans, boxes, and cases during her daily tasks.   Goal status: INITIAL  4.  Pt will increase right grip strength by 10# or greater to improve ability to grasp and hold pots/pans/outdoor tools during use.   Goal status: INITIAL  5.  Pt will decrease edema and fascial restrictions in RUE to minimal amounts to improve ability to perform functional reaching tasks during dressing and bathing.   Goal status: INITIAL  6.  Pt will decrease pain in RUE to 3/10 or less to improve ability to sleep in preferred position for 2+ hours without waking due to  pain.   Goal status: INITIAL      ASSESSMENT:  CLINICAL IMPRESSION: Patient is a 71 y.o. female who was seen today for occupational therapy evaluation s/p ORIF of right proximal ulna on 12/21/21. Pt reports she has been released to lift 1# and is trying to incorporate the RUE into her ADLs as she is able. She has difficulty with elbow flexion required for reaching up to her neck, shoulder, and behind her back and head, limiting her ability to perform ADLs independently.  Also has ulnar nerve tingling since the surgery. Of note-pt reports hx of right shoulder pain as well, unknown origin.   PERFORMANCE DEFICITS in functional skills including ADLs, IADLs, sensation, edema, ROM, strength, pain, fascial restrictions, and UE functional use  IMPAIRMENTS are limiting patient from ADLs, IADLs, rest and sleep, work, and leisure.   COMORBIDITIES may have co-morbidities  that affects occupational performance. Patient will benefit from skilled OT to address above  impairments and improve overall function.  MODIFICATION OR ASSISTANCE TO COMPLETE EVALUATION: No modification of tasks or assist necessary to complete an evaluation.  OT OCCUPATIONAL PROFILE AND HISTORY: Problem focused assessment: Including review of records relating to presenting problem.  CLINICAL DECISION MAKING: LOW - limited treatment options, no task modification necessary  REHAB POTENTIAL: Good  EVALUATION COMPLEXITY: Low      PLAN: OT FREQUENCY: 2x/week  OT DURATION: 4 weeks  PLANNED INTERVENTIONS: self care/ADL training, therapeutic exercise, therapeutic activity, manual therapy, passive range of motion, splinting, electrical stimulation, ultrasound, patient/family education, and DME and/or AE instructions  CONSULTED AND AGREED WITH PLAN OF CARE: Patient  PLAN FOR NEXT SESSION: Follow up on HEP, begin passive stretching and sustained stretching at [employer], OTR/L  [phone] 01/26/2022, 10:48  AM

## 2022-01-26 NOTE — Patient Instructions (Signed)
Complete 10-15X, 2-3 times a day.   1) Bicep Curl into Overhead Press    Standing hold wand towards ground with elbows straight, bend elbows while doing a bicep curl (hold position for 3-5 seconds), push wand up overhead to straighten elbow (hold for 3-5 seconds). Return to start position.    2) Elbow flexion and extension Bend your elbow upwards as shown and then lower to a straighten position.     3) Forearm supination and pronation Hold elbow at a right angle stabilizing on a table or armrest. Keep elbow at side and turn palm up and down.

## 2022-01-29 ENCOUNTER — Ambulatory Visit (HOSPITAL_COMMUNITY): Payer: Medicare HMO | Admitting: Occupational Therapy

## 2022-01-29 DIAGNOSIS — M25621 Stiffness of right elbow, not elsewhere classified: Secondary | ICD-10-CM | POA: Diagnosis not present

## 2022-01-29 DIAGNOSIS — M25521 Pain in right elbow: Secondary | ICD-10-CM | POA: Diagnosis not present

## 2022-01-29 DIAGNOSIS — R29898 Other symptoms and signs involving the musculoskeletal system: Secondary | ICD-10-CM

## 2022-01-29 NOTE — Therapy (Signed)
OUTPATIENT OCCUPATIONAL THERAPY ORTHO EVALUATION  Patient Name: Melody Ward MRN: [id number] DOB:24-Aug-1950, 71 y.o., female Today's Date: 01/29/2022  PCP: TBD REFERRING PROVIDER: Dr. Meredith Pel     Past Medical History:  Diagnosis Date   melanoma 1990   Past Surgical History:  Procedure Laterality Date   CESAREAN SECTION     FREE FLAP RADIAL FOREARM  03/06/2019   Dr. Nicolette Bang [facility]   MODIFIED RADICAL NECK DISSECTION  03/06/2019   NECK DISSECTION  03/06/2019   bilateral neck dissection   NERVE GRAFT Left 03/06/2019   left arm lower, Dr. Nicolette Bang at [facility]   ORIF ULNAR FRACTURE Right 12/21/2021   Procedure: OPEN REDUCTION INTERNAL FIXATION (ORIF) RIGHT PROXIMAL ULNAR FRACTURE;  Surgeon: Meredith Pel, MD;  Location: [place];  Service: Orthopedics;  Laterality: Right;   PHARYNGECTOMY  03/06/2019   Dr. Nicolette Bang at [place]  03/06/2019   Dr. Nicolette Bang at [place]  03/06/2019   pharyngoplasty, voice prosthesis/TEP placement, Dr. 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  Rationale for Evaluation and Treatment Rehabilitation  SUBJECTIVE:   SUBJECTIVE STATEMENT: S: I can lift 1 pound now.  Pt accompanied by:  self  PERTINENT HISTORY: Pt tripped over a drop cord and fractured her right proximal ulna, ORIF performed 12/21/21.   PRECAUTIONS: 1# lifting restriction  WEIGHT BEARING RESTRICTIONS Yes NWB  PAIN:  Are you having pain? No  FALLS: Has patient fallen in last 6 months? Yes. Number of falls 1   PLOF: Independent  PATIENT GOALS To improve ability to use RUE as dominant during ADLs.   OBJECTIVE:   HAND DOMINANCE: Right  ADLs: Overall ADLs: Pt reports she is unable to reach behind her head in er, she cannot touch her right shoulder. She needs both hands to replace the pad around her stoma and currently cannot use her right hand to assist. She has difficulty with washing hair, reaching behind back, reaching across to the left shoulder during dressing and grooming tasks.    FUNCTIONAL OUTCOME MEASURES: Quick Dash: 36.36  UPPER EXTREMITY ROM     Assessed seated, er/IR adducted  Active ROM Right eval  Shoulder internal rotation 90  Shoulder external rotation 40  Elbow flexion 83  Elbow extension -35  Wrist pronation 60  Wrist supination 90  (Blank rows = not tested)      Assessed supine    Passive ROM Right eval  Elbow flexion 104  Elbow extension -17  Wrist pronation 90  Wrist supination 90  (Blank rows = not tested)  UPPER EXTREMITY MMT:      Assessed seated, er/IR adducted  MMT Right eval  Shoulder internal rotation 5/5  Shoulder external rotation 5/5  Elbow flexion 5/5  Elbow extension 3-/5  Wrist pronation 4+/5  Wrist supination 4+/5  (Blank rows = not tested)  HAND FUNCTION: Grip strength: Right: 38 lbs; Left: 48 lbs   SENSATION: Tingling along right ulnar nerve distribution-right 4th and 5th digits.   EDEMA: Mild to moderate edema along medial epicondyle region and proximal upper arm.   COGNITION: Overall cognitive status: Within functional limits for tasks assessed  TODAY'S TREATMENT:  01/29/22  Manual  Therapy:  P/ROM: AA/ROM: Stretching:   PATIENT EDUCATION: Education details: elbow flexion/extension AA/ROM, A/ROM, forearm supination/pronation A/ROM Person educated: Patient Education method: Consulting civil engineer, Demonstration, and Handouts Education comprehension: verbalized understanding and returned demonstration   HOME EXERCISE PROGRAM: Eval: elbow flexion/extension AA/ROM, A/ROM, forearm supination/pronation A/ROM  GOALS: Goals reviewed with patient? Yes  SHORT TERM GOALS: Target date: 02/26/2022    Pt will be provided with and educated on HEP to improve mobility of RUE required for use as dominant during ADLs.   Goal status: INITIAL  2.  Pt will increase A/ROM of RUE by 30 degrees or more to improve ability to reach up to her shoulder and neck to perform hygiene and grooming tasks.    Goal status: INITIAL  3.  Pt will increase strength in RUE to 4+/5 throughout to improve ability to lift pots, pans, boxes, and cases during her daily tasks.   Goal status: INITIAL  4.  Pt will increase right grip strength by 10# or greater to improve ability to grasp and hold pots/pans/outdoor tools during use.   Goal status: INITIAL  5.  Pt will decrease edema and fascial restrictions in RUE to minimal amounts to improve ability to perform functional reaching tasks during dressing and bathing.   Goal status: INITIAL  6.  Pt will decrease pain in RUE to 3/10 or less to improve ability to sleep in preferred position for 2+ hours without waking due to pain.   Goal status: INITIAL      ASSESSMENT:  CLINICAL IMPRESSION: Patient is a 71 y.o. female who was seen today for occupational therapy evaluation s/p ORIF of right proximal ulna on 12/21/21. Pt reports she has been released to lift 1# and is trying to incorporate the RUE into her ADLs as she is able. She has difficulty with elbow flexion required for reaching up to her neck, shoulder, and behind her back and head, limiting her ability  to perform ADLs independently.  Also has ulnar nerve tingling since the surgery. Of note-pt reports hx of right shoulder pain as well, unknown origin.   PERFORMANCE DEFICITS in functional skills including ADLs, IADLs, sensation, edema, ROM, strength, pain, fascial restrictions, and UE functional use  IMPAIRMENTS are limiting patient from ADLs, IADLs, rest and sleep, work, and leisure.   COMORBIDITIES may have co-morbidities  that affects occupational performance. Patient will benefit from skilled OT to address above impairments and improve overall function.  MODIFICATION OR ASSISTANCE TO COMPLETE EVALUATION: No modification of tasks or assist necessary to complete an evaluation.  OT OCCUPATIONAL PROFILE AND HISTORY: Problem focused assessment: Including review of records relating to presenting problem.  CLINICAL DECISION MAKING: LOW - limited treatment options, no task modification necessary  REHAB POTENTIAL: Good  EVALUATION COMPLEXITY: Low      PLAN: OT FREQUENCY: 2x/week  OT DURATION: 4 weeks  PLANNED INTERVENTIONS: self care/ADL training, therapeutic exercise, therapeutic activity, manual therapy, passive range of motion, splinting, electrical stimulation, ultrasound, patient/family education, and DME and/or AE instructions  CONSULTED AND AGREED WITH PLAN OF CARE: Patient  PLAN  FOR NEXT SESSION: Follow up on HEP, begin passive stretching and sustained stretching at [address] OTR/L  [phone] 01/29/2022, 2:41 PM

## 2022-01-31 ENCOUNTER — Encounter (HOSPITAL_COMMUNITY): Payer: Self-pay | Admitting: Occupational Therapy

## 2022-01-31 ENCOUNTER — Ambulatory Visit (HOSPITAL_COMMUNITY): Payer: Medicare HMO | Admitting: Occupational Therapy

## 2022-01-31 DIAGNOSIS — M25521 Pain in right elbow: Secondary | ICD-10-CM | POA: Diagnosis not present

## 2022-01-31 DIAGNOSIS — R29898 Other symptoms and signs involving the musculoskeletal system: Secondary | ICD-10-CM | POA: Diagnosis not present

## 2022-01-31 DIAGNOSIS — M25621 Stiffness of right elbow, not elsewhere classified: Secondary | ICD-10-CM

## 2022-02-02 ENCOUNTER — Encounter (HOSPITAL_COMMUNITY): Payer: Self-pay | Admitting: Occupational Therapy

## 2022-02-02 NOTE — Therapy (Signed)
OUTPATIENT OCCUPATIONAL THERAPY ORTHO EVALUATION  Patient Name: Melody Ward MRN: [id number] DOB:09-Mar-1951, 71 y.o., female Today's Date: 02/02/2022  PCP: TBD REFERRING PROVIDER: Dr. Meredith Pel  End of Session:  01/31/22 1700  OT Visits / Re-Eval  Visit Number 3  Number of Visits 9  Date for OT Re-Evaluation 02/25/22  Authorization  Authorization Type [employer]  Authorization Time Period No visit limit  Progress Note Due on Visit 10  OT Time Calculation  OT Start Time 1610  OT Stop Time 1655  OT Time Calculation (min) 45 min  End of Session  Activity Tolerance Patient tolerated treatment well  Behavior During Therapy [facility] for tasks assessed/performed    Past Medical History:  Diagnosis Date   melanoma 1990   Past Surgical History:  Procedure Laterality Date   CESAREAN SECTION     FREE FLAP RADIAL FOREARM  03/06/2019   Dr. Nicolette Bang [facility]   MODIFIED RADICAL NECK DISSECTION  03/06/2019   NECK DISSECTION  03/06/2019   bilateral neck dissection   NERVE GRAFT Left 03/06/2019   left arm lower, Dr. Nicolette Bang at [facility]   ORIF [place] Right 12/21/2021   Procedure: OPEN REDUCTION INTERNAL FIXATION (ORIF) RIGHT PROXIMAL ULNAR FRACTURE;  Surgeon: Meredith Pel, MD;  Location: [place];  Service: Orthopedics;  Laterality: Right;   PHARYNGECTOMY  03/06/2019   Dr. Nicolette Bang at [place]  03/06/2019   Dr. Nicolette Bang at [place]  03/06/2019   pharyngoplasty, voice prosthesis/TEP placement, Dr. 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 No  PATIENT GOALS To improve ability to use RUE as dominant during ADLs.   OBJECTIVE:   HAND DOMINANCE: Right  FUNCTIONAL OUTCOME MEASURES: Quick Dash: 36.36  UPPER EXTREMITY ROM     Assessed seated, er/IR adducted  Active ROM Right eval  Shoulder internal rotation 90  Shoulder external rotation 40  Elbow flexion 83  Elbow extension -35  Wrist pronation 60  Wrist supination 90  (Blank rows = not tested)      Assessed supine    Passive ROM Right eval  Elbow flexion 104  Elbow extension -17  Wrist pronation 90  Wrist supination 90  (Blank rows = not tested)  UPPER EXTREMITY MMT:      Assessed seated, er/IR adducted  MMT Right eval  Shoulder internal rotation 5/5  Shoulder external rotation 5/5  Elbow flexion 5/5  Elbow extension 3-/5  Wrist pronation 4+/5  Wrist supination 4+/5  (Blank rows = not tested)  HAND FUNCTION: Grip strength: Right: 38 lbs; Left: 48 lbs   SENSATION: Tingling along right ulnar nerve distribution-right 4th and 5th digits.   EDEMA: Mild to moderate edema along medial epicondyle region and proximal upper arm.    TODAY'S TREATMENT:  01/31/22 -Manual Therapy: myofascial release and trigger point applied to R elbow region, forearm and wrist  to address fascial restrictions and pain, in order to improve ROM.  -Scar manipulation: small circular pressure along incision to assist with breaking up restrictions and softening scar to allow for further ROM.  -P/ROM: elbow flexion, elbow extension, wrist flexion, wrist extension, supination, pronation, 1x15  -AA/ROM: elbow flexion, up and out to assist in elbow extension. 1x10 -Stretching: elbow flexion on the wall (start up in shoulder flexion, pulling down in extension while forearms remain on the wall to facilitate elbow stretch), bicep stretch on the wall, wrist flexion, wrist extension, 5x15"  01/29/22 -Manual Therapy: myofascial release and trigger point applied to R elbow region, forearm and wrist to address fascial restrictions and pain, in order to improve ROM.  -Scar manipulation: small circular pressure along incision to assist with breaking up restrictions and softening scar to allow for further ROM.  -P/ROM: elbow flexion, elbow extension, wrist flexion, wrist extension, supination, pronation, 1x15 -AA/ROM: elbow flexion, elbow extension, 1x10 -Stretching: bicep stretch on the wall, wrist flexion, wrist extension, 3x10"  PATIENT EDUCATION: Education details: Elbow flexion stretch on wall Person educated: Patient Education method: Consulting civil engineer, Demonstration, and Handouts Education comprehension: verbalized understanding and returned demonstration   HOME EXERCISE PROGRAM: Eval: elbow flexion/extension AA/ROM, A/ROM, forearm supination/pronation A/ROM 10/9: forearm and bicep stretches 10/11: elbow flexion stretch on wall  GOALS: Goals reviewed with patient? Yes  SHORT TERM GOALS: Target date: 03/02/2022    Pt will be provided with and educated on HEP to improve mobility of RUE required for use as dominant during ADLs.   Goal status: IN PROGRESS  2.  Pt will increase A/ROM of RUE by 30 degrees or more to improve ability to reach up to her shoulder and neck to perform  hygiene and grooming tasks.    Goal status: IN PROGRESS  3.  Pt will increase strength in RUE to 4+/5 throughout to improve ability to lift pots, pans, boxes, and cases during her daily tasks.   Goal status: IN PROGRESS  4.  Pt will increase right grip strength by 10# or greater to improve ability to grasp and hold pots/pans/outdoor tools during use.   Goal status: IN PROGRESS  5.  Pt will decrease edema and fascial restrictions in RUE to minimal amounts to improve ability to perform functional reaching tasks during dressing and bathing.   Goal status: IN PROGRESS  6.  Pt will decrease pain in RUE to 3/10 or less to improve ability to sleep in preferred position for 2+ hours without waking due to pain.   Goal status: IN PROGRESS      ASSESSMENT:  CLINICAL IMPRESSION: Pt continues to have immobile scar tissue along the elbow, as well as swell and fascial restrictions, limiting her ROM both with flexion and extension. Therapist continues to work on manual therapy as well as scar mobilization to assist with releasing these restrictions to improve ROM. This session, therapist added wall stretches for elbow flexion, to assist with improving ROM. Pt requiring cuing for proper technique and positioning, as well as further education on importance  of self massage and scar mobilization. Pt continues to benefit from skilled OT to maximize ROM and strength, as well as reducing pain.  PLAN: OT FREQUENCY: 2x/week  OT DURATION: 4 weeks  PLANNED INTERVENTIONS: self care/ADL training, therapeutic exercise, therapeutic activity, manual therapy, passive range of motion, splinting, electrical stimulation, ultrasound, patient/family education, and DME and/or AE instructions  CONSULTED AND AGREED WITH PLAN OF CARE: Patient  PLAN FOR NEXT SESSION: Follow up on HEP, begin passive stretching and sustained stretching at doorway, P/ROM, AA/ROM, A/ROM   Paulita Fujita, OTR/L [phone] 02/02/2022,  9:46 AM

## 2022-02-07 ENCOUNTER — Ambulatory Visit (HOSPITAL_COMMUNITY): Payer: Medicare HMO | Admitting: Occupational Therapy

## 2022-02-07 ENCOUNTER — Encounter (HOSPITAL_COMMUNITY): Payer: Self-pay | Admitting: Occupational Therapy

## 2022-02-07 DIAGNOSIS — R29898 Other symptoms and signs involving the musculoskeletal system: Secondary | ICD-10-CM | POA: Diagnosis not present

## 2022-02-07 DIAGNOSIS — M25521 Pain in right elbow: Secondary | ICD-10-CM | POA: Diagnosis not present

## 2022-02-07 DIAGNOSIS — M25621 Stiffness of right elbow, not elsewhere classified: Secondary | ICD-10-CM

## 2022-02-07 NOTE — Therapy (Signed)
OUTPATIENT OCCUPATIONAL THERAPY ORTHO TREATMENT  Patient Name: Melody Ward MRN: [id number] DOB:09/12/1950, 71 y.o., female Today's Date: 02/07/2022  PCP: TBD REFERRING PROVIDER: Dr. Meredith Pel   OT End of Session - 02/07/22 1655     Visit Number 4    Number of Visits 9    Date for OT Re-Evaluation 02/25/22    Authorization Type [employer]    Authorization Time Period No visit limit    Progress Note Due on Visit 10    OT Start Time 1434    OT Stop Time 1512    OT Time Calculation (min) 38 min    Activity Tolerance Patient tolerated treatment well    Behavior During Therapy [facility] for tasks assessed/performed              Past Medical History:  Diagnosis Date   melanoma 1990   Past Surgical History:  Procedure Laterality Date   CESAREAN SECTION     FREE FLAP RADIAL FOREARM  03/06/2019   Dr. Nicolette Bang [facility]   MODIFIED RADICAL NECK DISSECTION  03/06/2019   NECK DISSECTION  03/06/2019   bilateral neck dissection   NERVE GRAFT Left 03/06/2019   left arm lower, Dr. Nicolette Bang at [facility]   ORIF [place] Right 12/21/2021   Procedure: OPEN REDUCTION INTERNAL FIXATION (ORIF) RIGHT PROXIMAL ULNAR FRACTURE;  Surgeon: Meredith Pel, MD;  Location: [place];  Service: Orthopedics;  Laterality: Right;   PHARYNGECTOMY  03/06/2019   Dr. Nicolette Bang at [place]  03/06/2019   Dr. Nicolette Bang at [place]  03/06/2019   pharyngoplasty, voice prosthesis/TEP placement, Dr. 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 No  PATIENT GOALS To improve ability to use RUE as dominant during ADLs.   OBJECTIVE:   HAND DOMINANCE: Right  FUNCTIONAL OUTCOME MEASURES: Quick Dash: 36.36  UPPER EXTREMITY ROM     Assessed seated, er/IR adducted  Active ROM Right eval  Shoulder internal rotation 90  Shoulder external rotation 40  Elbow flexion 83  Elbow extension -35  Wrist pronation 60  Wrist supination 90  (Blank rows = not tested)      Assessed supine    Passive ROM Right eval  Elbow flexion 104  Elbow extension -17  Wrist pronation 90  Wrist supination 90  (Blank rows = not tested)  UPPER EXTREMITY MMT:  Assessed seated, er/IR adducted  MMT Right eval  Shoulder internal rotation 5/5  Shoulder external rotation 5/5  Elbow flexion 5/5  Elbow extension 3-/5  Wrist pronation 4+/5  Wrist supination 4+/5  (Blank rows = not tested)  HAND FUNCTION: Grip strength: Right: 38 lbs; Left: 48 lbs   SENSATION: Tingling along right ulnar nerve distribution-right 4th and 5th digits.   EDEMA: Mild to moderate edema along medial epicondyle region and proximal upper arm.    TODAY'S TREATMENT:  02/07/22 -Manual Therapy: myofascial release and trigger point applied to R elbow region, forearm and wrist to address fascial  restrictions and pain, in order to improve ROM.  -Scar manipulation: small circular pressure along incision to assist with breaking up restrictions and softening scar to allow for further ROM. -P/ROM: elbow flexion, elbow extension, 10 reps each, 5 second holds at the end -Stretching: elbow flexion on the wall (start up in shoulder flexion, pulling down in extension while forearms remain on the wall to facilitate elbow stretch); elbow extension stretch at doorway (pt standing with shoulder and radial side of hand against wall, tissue/cloth under hand, stretching down with OT applying min-mod pressure at elbow for greater stretch). 3 reps, 10 second holds -weighted carry: pt holding 5# weight in full extension, carrying 2 minutes    01/31/22 -Manual Therapy: myofascial release and trigger point applied to R elbow region, forearm and wrist to address fascial restrictions and pain, in order to improve ROM.  -Scar manipulation: small circular pressure along incision to assist with breaking up restrictions and softening scar to allow for further ROM.  -P/ROM: elbow flexion, elbow extension, wrist flexion, wrist extension, supination, pronation, 1x15  -AA/ROM: elbow flexion, up and out to assist in elbow extension. 1x10 -Stretching: elbow flexion on the wall (start up in shoulder flexion, pulling down in extension while forearms remain on the wall to facilitate elbow stretch), bicep stretch on the wall, wrist flexion, wrist extension, 5x15"  01/29/22 -Manual Therapy: myofascial release and trigger point applied to R elbow region, forearm and wrist to address fascial restrictions and pain, in order to improve ROM.  -Scar manipulation: small circular pressure along incision to assist with breaking up restrictions and softening scar to allow for further ROM.  -P/ROM: elbow flexion, elbow extension, wrist flexion, wrist extension, supination, pronation, 1x15 -AA/ROM: elbow flexion, elbow extension,  1x10 -Stretching: bicep stretch on the wall, wrist flexion, wrist extension, 3x10"  PATIENT EDUCATION: Education details: elbow flexion stretch at doorway, elbow extension at doorway, elbow extension stretch at table top, weighted carry  Person educated: Patient Education method: Explanation, Demonstration, and Handouts Education comprehension: verbalized understanding and returned demonstration   HOME EXERCISE PROGRAM: Eval: elbow flexion/extension AA/ROM, A/ROM, forearm supination/pronation A/ROM 10/9: forearm and bicep stretches 10/11: elbow flexion stretch on wall 10/18: elbow flexion and extension stretches  GOALS: Goals reviewed with patient? Yes  SHORT TERM GOALS: Target date: 03/07/2022    Pt will be provided with and educated on HEP to improve mobility of RUE required for use as dominant during ADLs.   Goal status: IN PROGRESS  2.  Pt will increase A/ROM of RUE by 30 degrees or more to improve ability to reach up to her shoulder and neck to perform hygiene and grooming tasks.    Goal status: IN PROGRESS  3.  Pt will increase strength in RUE to 4+/5 throughout to improve ability to lift pots, pans, boxes, and cases during her daily tasks.   Goal status: IN PROGRESS  4.  Pt will increase right grip strength by 10# or greater to improve ability to grasp and hold pots/pans/outdoor tools during use.   Goal status: IN PROGRESS  5.  Pt will decrease edema and fascial restrictions in RUE to minimal amounts to improve ability to perform functional reaching tasks during dressing and bathing.   Goal status: IN PROGRESS  6.  Pt will decrease pain in RUE to 3/10 or less to improve ability to sleep in preferred position for 2+ hours without waking due to pain.   Goal status: IN PROGRESS      ASSESSMENT:  CLINICAL IMPRESSION: Pt reporting she has been doing her HEP but does not know how often or how many to do. Continued with myofascial release and scar massage this  session, completing passive stretching for flexion and extension. Session today focusing on sustained stretching for flexion and extension, also updated HEP. Pt is gaining extension, flexion continues to be difficult with tight end feel. Added weighted carry today, also added weighted stretch to HEP for elbow extension. Verbal cuing for form and technique.   PLAN: OT FREQUENCY: 2x/week  OT DURATION: 4 weeks  PLANNED INTERVENTIONS: self care/ADL training, therapeutic exercise, therapeutic activity, manual therapy, passive range of motion, splinting, electrical stimulation, ultrasound, patient/family education, and DME and/or AE instructions  CONSULTED AND AGREED WITH PLAN OF CARE: Patient  PLAN FOR NEXT SESSION: Follow up on HEP, measure for MD appt on Friday   Jordyne Poehlman, OTR/L  [phone] 02/07/2022, 4:56 PM

## 2022-02-08 ENCOUNTER — Ambulatory Visit (HOSPITAL_COMMUNITY): Payer: Medicare HMO | Admitting: Occupational Therapy

## 2022-02-08 ENCOUNTER — Encounter (HOSPITAL_COMMUNITY): Payer: Self-pay | Admitting: Occupational Therapy

## 2022-02-08 DIAGNOSIS — M25521 Pain in right elbow: Secondary | ICD-10-CM

## 2022-02-08 DIAGNOSIS — M25621 Stiffness of right elbow, not elsewhere classified: Secondary | ICD-10-CM

## 2022-02-08 DIAGNOSIS — R29898 Other symptoms and signs involving the musculoskeletal system: Secondary | ICD-10-CM

## 2022-02-08 NOTE — Therapy (Signed)
[place] TREATMENT AND MINI REASSESSMENT  Patient Name: CEANNA WAREING MRN: [id number] DOB:July 16, 1950, 71 y.o., female Today's Date: 02/08/2022  PCP: TBD REFERRING PROVIDER: Dr. Meredith Pel   OT End of Session - 02/08/22 1138     Visit Number 5    Number of Visits 9    Date for OT Re-Evaluation 02/25/22    Authorization Type [employer]    Authorization Time Period No visit limit    Progress Note Due on Visit 10    OT Start Time 0906    OT Stop Time 0945    OT Time Calculation (min) 39 min    Activity Tolerance Patient tolerated treatment well    Behavior During Therapy [facility] for tasks assessed/performed             Past Medical History:  Diagnosis Date   melanoma 1990   Past Surgical History:  Procedure Laterality Date   CESAREAN SECTION     FREE FLAP RADIAL FOREARM  03/06/2019   Dr. Nicolette Bang [place]  03/06/2019   NECK DISSECTION  03/06/2019   bilateral neck dissection   NERVE GRAFT Left 03/06/2019   left arm lower, Dr. Nico[facility] at [facility]   ORIF [place] Right 12/21/2021   Procedure: OPEN REDUCTION INTERNAL FIXATION (ORIF) RIGHT PROXIMAL ULNAR FRACTURE;  Surgeon: Meredith Pel, MD;  Location: [place];  Service: Orthopedics;  Laterality: Right;   PHARYNGECTOMY  03/06/2019   Dr. Nicolette Bang at [place]  03/06/2019   Dr. Nicolette Bang at [place]  03/06/2019   pharyngoplasty, voice prosthesis/TEP placement, Dr. 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 No  PATIENT GOALS To improve ability to use RUE as dominant during ADLs.   OBJECTIVE:   HAND DOMINANCE: Right  FUNCTIONAL OUTCOME MEASURES: Quick Dash: 36.36 02/08/22: 20.45  UPPER EXTREMITY ROM     Assessed seated, er/IR adducted  Active ROM Right eval Right 10/19  Shoulder internal rotation 90 90  Shoulder external rotation 40 84  Elbow flexion 83 106  Elbow extension -35 -20  Wrist pronation 60 90  Wrist supination 90 90  (Blank rows = not tested)      Assessed supine, assessed seated    Passive ROM Right eval Right 10/19  Elbow flexion 104 116  Elbow extension -17 -12  Wrist pronation 90  90  Wrist supination 90 90  (Blank rows = not tested)  UPPER EXTREMITY MMT:      Assessed seated, er/IR adducted  MMT Right eval Right 10/19  Shoulder internal rotation 5/5 5/5  Shoulder external rotation 5/5 5/5  Elbow flexion 5/5 5/5  Elbow extension 3-/5 3+/5  Wrist pronation 4+/5 5/5  Wrist supination 4+/5 5/5  (Blank rows = not tested)  HAND FUNCTION: Grip strength: Right: 38 lbs; Left: 48 lbs   SENSATION: Tingling along right ulnar nerve distribution-right 4th and 5th digits.  10/19: tingling improved to only 5th digit  EDEMA: Mild to moderate edema along medial  epicondyle region and proximal upper arm.  10/19: mild edema along lateral epicondyle and elbow   TODAY'S TREATMENT:  02/08/22 -Manual Therapy: myofascial release and trigger point applied to R elbow region, forearm and wrist to address fascial restrictions and pain, in order to improve ROM.  -Scar manipulation: small circular pressure along incision to assist with breaking up restrictions and softening scar to allow for further ROM. -P/ROM: elbow flexion, elbow extension, 10 reps each, 5 second holds at the end -Stretching: elbow flexion on the wall (start up in shoulder flexion, pulling down in extension while forearms remain on the wall to facilitate elbow stretch); elbow extension stretch at doorway (pt standing with shoulder and radial side of hand against wall, tissue/cloth under hand, stretching down with OT applying min-mod pressure at elbow for greater stretch). 3 reps, 10 second holds -Mini Reassessment for MD appt.  02/07/22 -Manual Therapy: myofascial release and trigger point applied to R elbow region, forearm and wrist to address fascial restrictions and pain, in order to improve ROM.  -Scar manipulation: small circular pressure along incision to assist with breaking up restrictions and softening scar to allow for further ROM. -P/ROM: elbow flexion, elbow extension, 10 reps each, 5 second holds at the end -Stretching: elbow flexion on the wall (start up in shoulder flexion, pulling down in extension while forearms remain on the wall to facilitate elbow stretch); elbow extension stretch at doorway (pt standing with shoulder and radial side of hand against wall, tissue/cloth under hand, stretching down with OT applying min-mod pressure at elbow for greater stretch). 3 reps, 10 second holds -weighted carry: pt holding 5# weight in full extension, carrying 2 minutes  01/31/22 -Manual Therapy: myofascial release and trigger point applied to R elbow region, forearm and wrist to address  fascial restrictions and pain, in order to improve ROM.  -Scar manipulation: small circular pressure along incision to assist with breaking up restrictions and softening scar to allow for further ROM. -P/ROM: elbow flexion, elbow extension, wrist flexion, wrist extension, supination,  pronation, 1x15  -AA/ROM: elbow flexion, up and out to assist in elbow extension. 1x10 -Stretching: elbow flexion on the wall (start up in shoulder flexion, pulling down in extension while forearms remain on the wall to facilitate elbow stretch), bicep stretch on the wall, wrist flexion, wrist extension, 5x15"  PATIENT EDUCATION: Education details: Reviewed HEP Person educated: Patient Education method: Explanation, Demonstration, and Handouts Education comprehension: verbalized understanding and returned demonstration   HOME EXERCISE PROGRAM: Eval: elbow flexion/extension AA/ROM, A/ROM, forearm supination/pronation A/ROM 10/9: forearm and bicep stretches 10/11: elbow flexion stretch on wall 10/18: elbow flexion and extension stretches  GOALS: Goals reviewed with patient? Yes  SHORT TERM GOALS: Target date: 03/08/2022    Pt will be provided with and educated on HEP to improve mobility of RUE required for use as dominant during ADLs.  Goal status: IN PROGRESS  2.  Pt will increase A/ROM of RUE by 30 degrees or more to improve ability to reach up to her shoulder and neck to perform hygiene and grooming tasks.    Goal status: IN PROGRESS  3.  Pt will increase strength in RUE to 4+/5 throughout to improve ability to lift pots, pans, boxes, and cases during her daily tasks.   Goal status: IN PROGRESS  4.  Pt will increase right grip strength by 10# or greater to improve ability to grasp and hold pots/pans/outdoor tools during use.   Goal status: IN PROGRESS  5.  Pt will decrease edema and fascial restrictions in RUE to minimal amounts to improve ability to perform functional reaching tasks during  dressing and bathing.   Goal status: IN PROGRESS  6.  Pt will decrease pain in RUE to 3/10 or less to improve ability to sleep in preferred position for 2+ hours without waking due to pain.   Goal status: IN PROGRESS      ASSESSMENT:  CLINICAL IMPRESSION: Pt seen for a mini Re-assessment this session for her follow up MD appointment. She is demonstrating improving ROM and strength with all elbow and forearm movements. This session focused on stretching for flexion and extension of her elbow. Flexion continues to have a tight end feel, and extension presents with severely tightened biceps and lateral epicondyle tendons. She continues to benefit from skilled OT to maximize ROM and strength in order to improve ADL's and IADL's.   PLAN: OT FREQUENCY: 2x/week  OT DURATION: 4 weeks  PLANNED INTERVENTIONS: self care/ADL training, therapeutic exercise, therapeutic activity, manual therapy, passive range of motion, splinting, electrical stimulation, ultrasound, patient/family education, and DME and/or AE instructions  CONSULTED AND AGREED WITH PLAN OF CARE: Patient  PLAN FOR NEXT SESSION: Follow up on HEP, Continue stretches, P/ROM, manual therapy   Paulita Fujita, OTR/L [phone] 02/08/2022, 11:38 AM

## 2022-02-09 ENCOUNTER — Ambulatory Visit (INDEPENDENT_AMBULATORY_CARE_PROVIDER_SITE_OTHER): Payer: Medicare HMO | Admitting: Surgical

## 2022-02-09 ENCOUNTER — Ambulatory Visit (INDEPENDENT_AMBULATORY_CARE_PROVIDER_SITE_OTHER): Payer: Medicare HMO

## 2022-02-09 DIAGNOSIS — S52001A Unspecified fracture of upper end of right ulna, initial encounter for closed fracture: Secondary | ICD-10-CM

## 2022-02-09 NOTE — Progress Notes (Signed)
Post-Op Visit Note   Patient: Melody Ward           Date of Birth: [birth year]           MRN: [id number] Visit Date: 02/09/2022 PCP: Pcp, No   Assessment & Plan:  Chief Complaint:  Chief Complaint  Patient presents with   Post-op Follow-up     12/21/21 (7w 1d)Open Reduction Internal Fixation (orif) Right Proximal Ulnar Fracture     Visit Diagnoses:  1. Closed fracture of proximal end of right ulna, unspecified fracture morphology, initial encounter     Plan: Patient is a 71 year old female who presents s/p right elbow ORIF on 12/21/2021.  Progressing well overall.  Working with occupational therapy focusing on achieving full range of motion.  Also doing a little bit of weighted exercises with 1 pound weight and with holding a 5 pound weight while keeping the arm straight while walking to help work on extension.  She notes the numbness and tingling in her fourth finger have completely resolved with a little bit of residual tingling in the fifth finger that is continually improving.  She feels her range of motion is improving but still having difficulty reaching her stoma.  No coarseness with range of motion that she is noticed.  Incision is healing well.    On exam, patient has well-healed incision.  She has range of motion from 120 degrees of flexion to about 10 to 15 degrees of extension with a fairly soft endpoint.  She has 2+ radial pulse of the operative extremity.  Intact EPL, FPL, finger abduction.  Intact pronation and supination equivalent range of motion to the contralateral extremity.  She has excellent tricep strength and bicep flexion strength.  Radiographs taken today of the right elbow demonstrate continued alignment of the right elbow fracture with no evidence of hardware failure.  There is no significant callus formation noted just yet.  Follow-Up Instructions: No follow-ups on file.   Orders:  Orders Placed This Encounter  Procedures   XR Elbow Complete Right  (3+View)   No orders of the defined types were placed in this encounter.   Imaging: No results found.  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 Nicolette Bang [facility]   MODIFIED RADICAL NECK DISSECTION  03/06/2019   NECK DISSECTION  03/06/2019   bilateral neck dissection   NERVE GRAFT Left 03/06/2019   left arm lower, Dr. Nicolette Bang at [facility]   ORIF ULNAR FRACTURE Right 12/21/2021   Procedure: OPEN REDUCTION INTERNAL FIXATION (ORIF) RIGHT PROXIMAL ULNAR FRACTURE;  Surgeon: Meredith Pel, MD;  Location: [place];  Service: Orthopedics;  Laterality: Right;   PHARYNGECTOMY  03/06/2019   Dr. Nicolette Bang at [place]  03/06/2019   Dr. Nicolette Bang at [place]  03/06/2019   pharyngoplasty, voice prosthesis/TEP placement, Dr. Nicolette Bang [place]  06/06/2018   ORIF distal radius fracture   Social History   Occupational History   Not on file  Tobacco Use   Smoking status: Never   Smokeless tobacco: Never  Vaping Use   Vaping Use: Never used  Substance and Sexual Activity   Alcohol use: Yes    Comment: wine occ   Drug  use: Not Currently   Sexual activity: Not on file

## 2022-02-10 ENCOUNTER — Encounter: Payer: Self-pay | Admitting: Orthopedic Surgery

## 2022-02-13 ENCOUNTER — Ambulatory Visit (HOSPITAL_COMMUNITY): Payer: Medicare HMO | Admitting: Occupational Therapy

## 2022-02-13 DIAGNOSIS — M25521 Pain in right elbow: Secondary | ICD-10-CM | POA: Diagnosis not present

## 2022-02-13 DIAGNOSIS — M25621 Stiffness of right elbow, not elsewhere classified: Secondary | ICD-10-CM | POA: Diagnosis not present

## 2022-02-13 DIAGNOSIS — R29898 Other symptoms and signs involving the musculoskeletal system: Secondary | ICD-10-CM | POA: Diagnosis not present

## 2022-02-13 NOTE — Therapy (Signed)
[place] TREATMENT AND MINI REASSESSMENT  Patient Name: Melody Ward MRN: [id number] DOB:1950-08-26, 71 y.o., female Today's Date: 02/13/2022  PCP: TBD REFERRING PROVIDER: Dr. Meredith Pel     Past Medical History:  Diagnosis Date   melanoma 1990   Past Surgical History:  Procedure Laterality Date   CESAREAN SECTION     FREE FLAP RADIAL FOREARM  03/06/2019   Dr. Nicolette Bang [facility]   MODIFIED RADICAL NECK DISSECTION  03/06/2019   NECK DISSECTION  03/06/2019   bilateral neck dissection   NERVE GRAFT Left 03/06/2019   left arm lower, Dr. Nico[facility] Bang at [facility]   ORIF ULNAR FRACTURE Right 12/21/2021   Procedure: OPEN REDUCTION INTERNAL FIXATION (ORIF) RIGHT PROXIMAL ULNAR FRACTURE;  Surgeon: Meredith Pel, MD;  Location: [place];  Service: Orthopedics;  Laterality: Right;   PHARYNGECTOMY  03/06/2019   Dr. Nicolette Bang at [place]  03/06/2019   Dr. Nicolette Bang at [place]  03/06/2019   pharyngoplasty, voice prosthesis/TEP placement, Dr. Nicolette Bang [place]  06/06/2018   ORIF distal radius fracture   Patient Active Problem List   Diagnosis Date Noted   Closed displaced fracture of olecranon process with intraarticular extension of right ulna    Subarachnoid bleed (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  Rationale for Evaluation and Treatment Rehabilitation  SUBJECTIVE:   SUBJECTIVE STATEMENT: S: "I've been going to  exercise classes and doing the movements without weights"   PRECAUTIONS: 1# lifting restriction  WEIGHT BEARING RESTRICTIONS Yes NWB  PAIN:  Are you having pain? No  PATIENT GOALS To improve ability to use RUE as dominant during ADLs.   OBJECTIVE:   HAND DOMINANCE: Right  FUNCTIONAL OUTCOME MEASURES: Quick Dash: 36.36 02/08/22: 20.45  UPPER EXTREMITY ROM     Assessed seated, er/IR adducted  Active ROM Right eval Right 10/19  Shoulder internal rotation 90 90  Shoulder external rotation 40 84  Elbow flexion 83 106  Elbow extension -35 -20  Wrist pronation 60 90  Wrist supination 90 90  (Blank rows = not tested)      Assessed supine, assessed seated    Passive ROM Right eval Right 10/19  Elbow flexion 104 116  Elbow extension -17 -12  Wrist pronation 90 90  Wrist supination 90 90  (Blank rows = not tested)  UPPER EXTREMITY MMT:      Assessed seated, er/IR adducted  MMT Right eval Right 10/19  Shoulder internal rotation 5/5 5/5  Shoulder external rotation 5/5 5/5  Elbow flexion 5/5 5/5  Elbow extension 3-/5 3+/5  Wrist pronation 4+/5 5/5  Wrist supination 4+/5 5/5  (Blank rows = not tested)  HAND FUNCTION: Grip strength: Right: 38 lbs; Left: 48 lbs   SENSATION: Tingling along right ulnar nerve distribution-right 4th and 5th digits.  10/19: tingling improved to only 5th digit  EDEMA: Mild to moderate edema along medial epicondyle region and proximal upper arm.  10/19: mild edema along lateral epicondyle  and elbow   TODAY'S TREATMENT:  02/13/22 -Manual Therapy: myofascial release and trigger point applied to R elbow region, forearm and wrist to address fascial restrictions and pain, in order to improve ROM.  -Scar manipulation: small circular pressure along incision to assist with breaking up restrictions and softening scar to allow for further ROM. -P/ROM: elbow flexion, elbow extension, 10 reps each, 5 second holds at the end -A/ROM: wrist  flexion, extension, ulnar/radial deviation, supination, pronation, elbow flexion and elbow extension, 1x10 -Stretching: elbow flexion on the wall (start up in shoulder flexion, pulling down in extension while forearms remain on the wall to facilitate elbow stretch); elbow extension stretch at doorway (pt standing with shoulder and radial side of hand against wall, tissue/cloth under hand, stretching down with OT applying min-mod pressure at elbow for greater stretch). 3 reps, 10 second holds  02/08/22 -Manual Therapy: myofascial release and trigger point applied to R elbow region, forearm and wrist to address fascial restrictions and pain, in order to improve ROM.  -Scar manipulation: small circular pressure along incision to assist with breaking up restrictions and softening scar to allow for further ROM. -P/ROM: elbow flexion, elbow extension, 10 reps each, 5 second holds at the end -Stretching: elbow flexion on the wall (start up in shoulder flexion, pulling down in extension while forearms remain on the wall to facilitate elbow stretch); elbow extension stretch at doorway (pt standing with shoulder and radial side of hand against wall, tissue/cloth under hand, stretching down with OT applying min-mod pressure at elbow for greater stretch). 3 reps, 10 second holds -Mini Reassessment for MD appt.  02/07/22 -Manual Therapy: myofascial release and trigger point applied to R elbow region, forearm and wrist to address fascial restrictions and pain, in order to improve ROM.  -Scar manipulation: small circular pressure along incision to assist with breaking up restrictions and softening scar to allow for further ROM. -P/ROM: elbow flexion, elbow extension, 10 reps each, 5 second holds at the end -Stretching: elbow flexion on the wall (start up in shoulder flexion, pulling down in extension while forearms remain on the wall to facilitate elbow stretch); elbow extension stretch at doorway (pt standing with  shoulder and radial side of hand against wall, tissue/cloth under hand, stretching down with OT applying min-mod pressure at elbow for greater stretch). 3 reps, 10 second holds -weighted carry: pt holding 5# weight in full extension, carrying 2 minutes   PATIENT EDUCATION: Education details: Reviewed HEP Person educated: Patient Education method: Explanation, Demonstration, and Handouts Education comprehension: verbalized understanding and returned demonstration   HOME EXERCISE PROGRAM: Eval: elbow flexion/extension AA/ROM, A/ROM, forearm supination/pronation A/ROM 10/9: forearm and bicep stretches 10/11: elbow flexion stretch on wall 10/18: elbow flexion and extension stretches  GOALS: Goals reviewed with patient? Yes  SHORT TERM GOALS: Target date: 03/13/2022    Pt will be provided with and educated on HEP to improve mobility of RUE required for use as dominant during ADLs.   Goal status: IN PROGRESS  2.  Pt will increase A/ROM of RUE by 30 degrees or more to improve ability to reach up to her shoulder and neck to perform hygiene and grooming tasks.    Goal status: IN PROGRESS  3.  Pt will increase strength in RUE to 4+/5 throughout to improve ability to lift pots, pans, boxes, and cases during her daily tasks.   Goal status: IN PROGRESS  4.  Pt will increase right grip strength by 10# or greater to improve ability to grasp  and hold pots/pans/outdoor tools during use.   Goal status: IN PROGRESS  5.  Pt will decrease edema and fascial restrictions in RUE to minimal amounts to improve ability to perform functional reaching tasks during dressing and bathing.   Goal status: IN PROGRESS  6.  Pt will decrease pain in RUE to 3/10 or less to improve ability to sleep in preferred position for 2+ hours without waking due to pain.   Goal status: IN PROGRESS      ASSESSMENT:  CLINICAL IMPRESSION: Pt seen for a mini Re-assessment this session for her follow up MD  appointment. She is demonstrating improving ROM and strength with all elbow and forearm movements. This session focused on stretching for flexion and extension of her elbow. Flexion continues to have a tight end feel, and extension presents with severely tightened biceps and lateral epicondyle tendons. She continues to benefit from skilled OT to maximize ROM and strength in order to improve ADL's and IADL's.   PLAN: OT FREQUENCY: 2x/week  OT DURATION: 4 weeks  PLANNED INTERVENTIONS: self care/ADL training, therapeutic exercise, therapeutic activity, manual therapy, passive range of motion, splinting, electrical stimulation, ultrasound, patient/family education, and DME and/or AE instructions  CONSULTED AND AGREED WITH PLAN OF CARE: Patient  PLAN FOR NEXT SESSION: Follow up on HEP, Continue stretches, P/ROM, manual therapy   Paulita Fujita, OTR/L [phone] 02/13/2022, 3:24 PM

## 2022-02-15 ENCOUNTER — Encounter (HOSPITAL_COMMUNITY): Payer: Self-pay | Admitting: Occupational Therapy

## 2022-02-16 ENCOUNTER — Encounter (HOSPITAL_COMMUNITY): Payer: Medicare HMO | Admitting: Occupational Therapy

## 2022-02-20 ENCOUNTER — Encounter (HOSPITAL_COMMUNITY): Payer: Self-pay | Admitting: Occupational Therapy

## 2022-02-20 ENCOUNTER — Ambulatory Visit (HOSPITAL_COMMUNITY): Payer: Medicare HMO | Admitting: Occupational Therapy

## 2022-02-20 DIAGNOSIS — R29898 Other symptoms and signs involving the musculoskeletal system: Secondary | ICD-10-CM

## 2022-02-20 DIAGNOSIS — M25521 Pain in right elbow: Secondary | ICD-10-CM | POA: Diagnosis not present

## 2022-02-20 DIAGNOSIS — M25621 Stiffness of right elbow, not elsewhere classified: Secondary | ICD-10-CM | POA: Diagnosis not present

## 2022-02-20 NOTE — Therapy (Signed)
OUTPATIENT OCCUPATIONAL THERAPY ORTHO TREATMENT   Patient Name: Melody Ward MRN: [id number] DOB:Dec 26, 1950, 71 y.o., female Today's Date: 02/13/2022  PCP: TBD REFERRING PROVIDER: Dr. Meredith Pel   OT End of Session - 02/20/22 1029     Visit Number 7    Number of Visits 9    Date for OT Re-Evaluation 02/25/22    Authorization Type [employer]    Authorization Time Period No visit limit    Progress Note Due on Visit 10    OT Start Time 0906    OT Stop Time 0948    OT Time Calculation (min) 42 min    Activity Tolerance Patient tolerated treatment well    Behavior During Therapy [facility] for tasks assessed/performed            Past Medical History:  Diagnosis Date   melanoma 1990   Past Surgical History:  Procedure Laterality Date   CESAREAN SECTION     FREE FLAP RADIAL FOREARM  03/06/2019   Dr. Nicolette Bang [facility]   MODIFIED RADICAL NECK DISSECTION  03/06/2019   NECK DISSECTION  03/06/2019   bilateral neck dissection   NERVE GRAFT Left 03/06/2019   left arm lower, Dr. Nicolette Bang at [facility]   ORIF [place] Right 12/21/2021   Procedure: OPEN REDUCTION INTERNAL FIXATION (ORIF) RIGHT PROXIMAL ULNAR FRACTURE;  Surgeon: Meredith Pel, MD;  Location: [place];  Service: Orthopedics;  Laterality: Right;   PHARYNGECTOMY  03/06/2019   Dr. Nicolette Bang at [place]  03/06/2019   Dr. Nicolette Bang at [place]  03/06/2019   pharyngoplasty, voice prosthesis/TEP placement, Dr. 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 No  PATIENT GOALS To improve ability to use RUE as dominant during ADLs.   OBJECTIVE:   HAND DOMINANCE: Right  FUNCTIONAL OUTCOME MEASURES: Quick Dash: 36.36 02/08/22: 20.45  UPPER EXTREMITY ROM     Assessed seated, er/IR adducted  Active ROM Right eval Right 10/19  Shoulder internal rotation 90 90  Shoulder external rotation 40 84  Elbow flexion 83 106  Elbow extension -35 -20  Wrist pronation 60 90  Wrist supination 90 90  (Blank rows = not tested)      Assessed supine, assessed seated    Passive ROM Right eval Right 10/19  Elbow flexion 104 116  Elbow extension -17 -12  Wrist pronation 90  90  Wrist supination 90 90  (Blank rows = not tested)  UPPER EXTREMITY MMT:      Assessed seated, er/IR adducted  MMT Right eval Right 10/19  Shoulder internal rotation 5/5 5/5  Shoulder external rotation 5/5 5/5  Elbow flexion 5/5 5/5  Elbow extension 3-/5 3+/5  Wrist pronation 4+/5 5/5  Wrist supination 4+/5 5/5  (Blank rows = not tested)  HAND FUNCTION: Grip strength: Right: 38 lbs; Left: 48 lbs   SENSATION: Tingling along right ulnar nerve distribution-right 4th and 5th digits.  10/19: tingling improved to only 5th digit  EDEMA: Mild to moderate edema along medial epicondyle  region and proximal upper arm.  10/19: mild edema along lateral epicondyle and elbow   TODAY'S TREATMENT:  02/20/22 -Manual Therapy: myofascial release and trigger point applied to R elbow region, forearm and wrist to address fascial restrictions and pain, in order to improve ROM.  -Scar manipulation: small circular pressure along incision to assist with breaking up restrictions and softening scar to allow for further ROM. -P/ROM: elbow flexion, elbow extension, 10 reps each, 5 second holds at the end -A/ROM: wrist flexion, extension, ulnar/radial deviation, supination, pronation, elbow flexion and elbow extension, 1x10 -Stretching: elbow flexion on the wall (start up in shoulder flexion, pulling down in extension while forearms remain on the wall to facilitate elbow stretch); elbow extension stretch at doorway (pt standing with shoulder and radial side of hand against wall, tissue/cloth under hand, stretching down with OT applying min-mod pressure at elbow for greater stretch). 5 reps, 10 second holds -Theraband: red theraband, abduction with elbows flexed at 90 degrees, ER, bicep curls, tricep extensions  02/13/22 -Manual Therapy: myofascial release and trigger point applied to R elbow region, forearm and wrist to address fascial restrictions and pain, in order to improve ROM.  -Scar manipulation: small circular pressure along incision to assist with breaking up restrictions and softening scar to allow for further ROM. -P/ROM: elbow flexion, elbow extension, 10 reps each, 5 second holds at the end -A/ROM: wrist flexion, extension, ulnar/radial deviation, supination, pronation, elbow flexion and elbow extension, 1x10 -Stretching: elbow flexion on the wall (start up in shoulder flexion, pulling down in extension while forearms remain on the wall to facilitate elbow stretch); elbow extension stretch at doorway (pt standing with shoulder and radial side of hand against wall, tissue/cloth under hand,  stretching down with OT applying min-mod pressure at elbow for greater stretch). 5 reps, 10 second holds  02/08/22 -Manual Therapy: myofascial release and trigger point applied to R elbow region, forearm and wrist to address fascial restrictions and pain, in order to improve ROM.  -Scar manipulation: small circular pressure along incision to assist with breaking up restrictions and softening scar to allow for further ROM. -P/ROM: elbow flexion, elbow extension, 10 reps each, 5 second holds at the end -Stretching: elbow flexion on the wall (start up in shoulder flexion, pulling down in extension while forearms remain on the wall to facilitate elbow stretch); elbow extension stretch at doorway (pt standing with shoulder and radial side of hand against wall, tissue/cloth under hand, stretching down with OT applying min-mod pressure at elbow for greater stretch). 3 reps, 10 second holds -Mini Reassessment for MD appt.   PATIENT EDUCATION: Education details: Theraband exercises Person educated: Patient Education method: Explanation, Demonstration, and Handouts Education comprehension: verbalized understanding and returned demonstration   HOME EXERCISE PROGRAM: Eval: elbow flexion/extension AA/ROM, A/ROM, forearm supination/pronation A/ROM 10/9: forearm and bicep stretches 10/11: elbow flexion stretch on  wall 10/18: elbow flexion and extension stretches 10/31: Theraband exercises  GOALS: Goals reviewed with patient? Yes  SHORT TERM GOALS: Target date: 03/20/2022    Pt will be provided with and educated on HEP to improve mobility of RUE required for use as dominant during ADLs.   Goal status: IN PROGRESS  2.  Pt will increase A/ROM of RUE by 30 degrees or more to improve ability to reach up to her shoulder and neck to perform hygiene and grooming tasks.    Goal status: IN PROGRESS  3.  Pt will increase strength in RUE to 4+/5 throughout to improve ability to lift pots, pans, boxes, and  cases during her daily tasks.   Goal status: IN PROGRESS  4.  Pt will increase right grip strength by 10# or greater to improve ability to grasp and hold pots/pans/outdoor tools during use.   Goal status: IN PROGRESS  5.  Pt will decrease edema and fascial restrictions in RUE to minimal amounts to improve ability to perform functional reaching tasks during dressing and bathing.   Goal status: IN PROGRESS  6.  Pt will decrease pain in RUE to 3/10 or less to improve ability to sleep in preferred position for 2+ hours without waking due to pain.   Goal status: IN PROGRESS   ASSESSMENT:  CLINICAL IMPRESSION: Pt presents to therapy this session reporting that she has not been able to complete her exercises due to going on vacation this past weekend. Therapist provided education on importance of completing exercises to improve strength and ROM. This session pt worked on Mud[employer] and continued aggressive stretching for improved ROM and strength. Therapist provides manual assist for aggressive stretching and verbal cuing for technique and positioning. Pt continues to benefit from skilled OT to maximize ROM and strength in order to improve ADL's and IADL's.   PLAN: OT FREQUENCY: 2x/week  OT DURATION: 4 weeks  PLANNED INTERVENTIONS: self care/ADL training, therapeutic exercise, therapeutic activity, manual therapy, passive range of motion, splinting, electrical stimulation, ultrasound, patient/family education, and DME and/or AE instructions  CONSULTED AND AGREED WITH PLAN OF CARE: Patient  PLAN FOR NEXT SESSION: Follow up on HEP, Continue stretches, P/ROM, manual therapy, AA/ROM, A/ROM, light resistance strengthening   Paulita Fujita, OTR/L [phone] 02/20/2022, 10:32 AM

## 2022-02-22 ENCOUNTER — Encounter (HOSPITAL_COMMUNITY): Payer: Self-pay | Admitting: Occupational Therapy

## 2022-02-22 ENCOUNTER — Ambulatory Visit (HOSPITAL_COMMUNITY): Payer: Medicare HMO | Attending: Orthopedic Surgery | Admitting: Occupational Therapy

## 2022-02-22 DIAGNOSIS — M25521 Pain in right elbow: Secondary | ICD-10-CM | POA: Insufficient documentation

## 2022-02-22 DIAGNOSIS — R29898 Other symptoms and signs involving the musculoskeletal system: Secondary | ICD-10-CM | POA: Insufficient documentation

## 2022-02-22 DIAGNOSIS — M25621 Stiffness of right elbow, not elsewhere classified: Secondary | ICD-10-CM | POA: Insufficient documentation

## 2022-02-22 NOTE — Therapy (Signed)
OUTPATIENT OCCUPATIONAL THERAPY ORTHO TREATMENT   Patient Name: Melody Ward MRN: [id number] DOB:02-22-1951, 71 y.o., female Today's Date: 02/13/2022  PCP: TBD REFERRING PROVIDER: Dr. Meredith Pel   OT End of Session - 02/22/22 1209     Visit Number 8    Number of Visits 9    Date for OT Re-Evaluation 02/25/22    Authorization Type [employer] Medicare    Authorization Time Period No visit limit    Progress Note Due on Visit 10    OT Start Time 1125    OT Stop Time 1205    OT Time Calculation (min) 40 min    Activity Tolerance Patient tolerated treatment well    Behavior During Therapy [facility] for tasks assessed/performed            Past Medical History:  Diagnosis Date   melanoma 1990   Past Surgical History:  Procedure Laterality Date   CESAREAN SECTION     FREE FLAP RADIAL FOREARM  03/06/2019   Dr. Nicolette Bang [facility]   MODIFIED RADICAL NECK DISSECTION  03/06/2019   NECK DISSECTION  03/06/2019   bilateral neck dissection   NERVE GRAFT Left 03/06/2019   left arm lower, Dr. Nicolette Bang at [facility]   ORIF [place] Right 12/21/2021   Procedure: OPEN REDUCTION INTERNAL FIXATION (ORIF) RIGHT PROXIMAL ULNAR FRACTURE;  Surgeon: Meredith Pel, MD;  Location: [facility];  Service: Orthopedics;  Laterality: Right;   PHARYNGECTOMY  03/06/2019   Dr. Nicolette Bang at [place]  03/06/2019   Dr. Nicolette Bang at [place]  03/06/2019   pharyngoplasty, voice prosthesis/TEP placement, Dr. 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 No  PATIENT GOALS To improve ability to use RUE as dominant during ADLs.   OBJECTIVE:   HAND DOMINANCE: Right  FUNCTIONAL OUTCOME MEASURES: Quick Dash: 36.36 02/08/22: 20.45  UPPER EXTREMITY ROM     Assessed seated, er/IR adducted  Active ROM Right eval Right 10/19  Shoulder internal rotation 90 90  Shoulder external rotation 40 84  Elbow flexion 83 106  Elbow extension -35 -20  Wrist pronation 60 90  Wrist supination 90 90  (Blank rows = not tested)      Assessed supine, assessed seated    Passive ROM Right eval Right 10/19  Elbow flexion 104 116  Elbow extension -17 -12  Wrist pronation 90 90  Wrist supination  90 90  (Blank rows = not tested)  UPPER EXTREMITY MMT:      Assessed seated, er/IR adducted  MMT Right eval Right 10/19  Shoulder internal rotation 5/5 5/5  Shoulder external rotation 5/5 5/5  Elbow flexion 5/5 5/5  Elbow extension 3-/5 3+/5  Wrist pronation 4+/5 5/5  Wrist supination 4+/5 5/5  (Blank rows = not tested)  HAND FUNCTION: Grip strength: Right: 38 lbs; Left: 48 lbs   SENSATION: Tingling along right ulnar nerve distribution-right 4th and 5th digits.  10/19: tingling improved to only 5th digit  EDEMA: Mild to moderate edema along medial epicondyle region and  proximal upper arm.  10/19: mild edema along lateral epicondyle and elbow   TODAY'S TREATMENT:  02/21/22 -Manual Therapy: myofascial release and trigger point applied to R elbow region, forearm and wrist to address fascial restrictions and pain, in order to improve ROM.  -Scar manipulation: small circular pressure along incision to assist with breaking up restrictions and softening scar to allow for further ROM. -P/ROM: elbow flexion, elbow extension, 10 reps each, 5 second holds at the end -A/ROM: wrist flexion, extension, ulnar/radial deviation, supination, pronation, elbow flexion and elbow extension, 1x10 -Stretching: elbow flexion on the wall (start up in shoulder flexion, pulling down in extension while forearms remain on the wall to facilitate elbow stretch); elbow extension stretch at doorway (pt standing with shoulder and radial side of hand against wall, tissue/cloth under hand, stretching down with OT applying min-mod pressure at elbow for greater stretch). 5 reps, 10 second holds -Strengthening: Red theraband, bicep curls, tricep extensions, 1x15 -Theraband: red theraband, abduction with elbows flexed at 90 degrees, ER, 1x15 -Weighted holds: 5lbs, arm in neutral down by her side, 2x90"  02/20/22 -Manual Therapy: myofascial release and trigger point applied to R elbow region, forearm and wrist to address fascial restrictions and pain, in order to improve ROM.  -Scar manipulation: small circular pressure along incision to assist with breaking up restrictions and softening scar to allow for further ROM. -P/ROM: elbow flexion, elbow extension, 10 reps each, 5 second holds at the end -A/ROM: wrist flexion, extension, ulnar/radial deviation, supination, pronation, elbow flexion and elbow extension, 1x10 -Stretching: elbow flexion on the wall (start up in shoulder flexion, pulling down in extension while forearms remain on the wall to facilitate elbow stretch); elbow extension stretch at  doorway (pt standing with shoulder and radial side of hand against wall, tissue/cloth under hand, stretching down with OT applying min-mod pressure at elbow for greater stretch). 5 reps, 10 second holds -Theraband: red theraband, abduction with elbows flexed at 90 degrees, ER, bicep curls, tricep extensions  02/13/22 -Manual Therapy: myofascial release and trigger point applied to R elbow region, forearm and wrist to address fascial restrictions and pain, in order to improve ROM.  -Scar manipulation: small circular pressure along incision to assist with breaking up restrictions and softening scar to allow for further ROM. -P/ROM: elbow flexion, elbow extension, 10 reps each, 5 second holds at the end -A/ROM: wrist flexion, extension, ulnar/radial deviation, supination, pronation, elbow flexion and elbow extension, 1x10 -Stretching: elbow flexion on the wall (start up in shoulder flexion, pulling down in extension while forearms remain on the wall to facilitate elbow stretch); elbow extension stretch at doorway (pt standing with shoulder and radial side of hand against wall, tissue/cloth under hand, stretching down with OT applying min-mod pressure at elbow for greater stretch). 5 reps, 10 second holds   PATIENT EDUCATION: Education details: Theraband exercises Person educated: Patient  Education method: Explanation, Demonstration, and Handouts Education comprehension: verbalized understanding and returned demonstration   HOME EXERCISE PROGRAM: Eval: elbow flexion/extension AA/ROM, A/ROM, forearm supination/pronation A/ROM 10/9: forearm and bicep stretches 10/11: elbow flexion stretch on wall 10/18: elbow flexion and extension stretches 10/31: Theraband exercises  GOALS: Goals reviewed with patient? Yes  SHORT TERM GOALS: Target date: 03/22/2022    Pt will be provided with and educated on HEP to improve mobility of RUE required for use as dominant during ADLs.   Goal status: IN  PROGRESS  2.  Pt will increase A/ROM of RUE by 30 degrees or more to improve ability to reach up to her shoulder and neck to perform hygiene and grooming tasks.    Goal status: IN PROGRESS  3.  Pt will increase strength in RUE to 4+/5 throughout to improve ability to lift pots, pans, boxes, and cases during her daily tasks.   Goal status: IN PROGRESS  4.  Pt will increase right grip strength by 10# or greater to improve ability to grasp and hold pots/pans/outdoor tools during use.   Goal status: IN PROGRESS  5.  Pt will decrease edema and fascial restrictions in RUE to minimal amounts to improve ability to perform functional reaching tasks during dressing and bathing.   Goal status: IN PROGRESS  6.  Pt will decrease pain in RUE to 3/10 or less to improve ability to sleep in preferred position for 2+ hours without waking due to pain.   Goal status: IN PROGRESS   ASSESSMENT:  CLINICAL IMPRESSION: Pt with improving pain levels. She continues to have limited extension in her elbows, however her flexion is progressing well with ability to touch her stoma. This session therapist added more strengthening with light theraband's, as well as continued aggressive stretching for elbow extension and flexion. Therapist provides manual assist for aggressive stretching and verbal cuing for technique and positioning. Pt continues to benefit from skilled OT to maximize ROM and strength in order to improve ADL's and IADL's.   PLAN: OT FREQUENCY: 2x/week  OT DURATION: 4 weeks  PLANNED INTERVENTIONS: self care/ADL training, therapeutic exercise, therapeutic activity, manual therapy, passive range of motion, splinting, electrical stimulation, ultrasound, patient/family education, and DME and/or AE instructions  CONSULTED AND AGREED WITH PLAN OF CARE: Patient  PLAN FOR NEXT SESSION: Follow up on HEP, Continue stretches, P/ROM, manual therapy, AA/ROM, A/ROM, light resistance strengthening, reassessment  for possible [place], OTR/L [phone] 02/22/2022, 12:09 PM

## 2022-03-09 ENCOUNTER — Encounter: Payer: Self-pay | Admitting: Orthopedic Surgery

## 2022-03-09 ENCOUNTER — Ambulatory Visit (INDEPENDENT_AMBULATORY_CARE_PROVIDER_SITE_OTHER): Payer: Medicare HMO | Admitting: Orthopedic Surgery

## 2022-03-09 ENCOUNTER — Ambulatory Visit (INDEPENDENT_AMBULATORY_CARE_PROVIDER_SITE_OTHER): Payer: Medicare HMO

## 2022-03-09 DIAGNOSIS — S52001A Unspecified fracture of upper end of right ulna, initial encounter for closed fracture: Secondary | ICD-10-CM

## 2022-03-09 NOTE — Progress Notes (Signed)
Post-Op Visit Note   Patient: Melody Ward           Date of Birth: 1951/01/29           MRN: [id number] Visit Date: 03/09/2022 PCP: Pcp, No   Assessment & Plan:  Chief Complaint:  Chief Complaint  Patient presents with   Right Elbow - Follow-up    12/21/21 right ORIF proximal ulnar fx   Visit Diagnoses:  1. Closed fracture of proximal end of right ulna, unspecified fracture morphology, initial encounter     Plan: Is a 71 year old patient who is about 10 weeks out right proximal ulna fracture fixation.  She did have a very low vitamin D early on.  That has been supplemented over the past 8 weeks.  She is doing well from a activity standpoint.  Has some occasional pain in the elbow.  On examination she has improved range of motion to about 20-1 10 of flexion.  Pronation supination is full.  No real tenderness around the fracture site.  Plan at this time is radiographs show no worsening of implant position.  Questionable slight lucency around some of the distal screws.  Would like to have her maintain her current activity level with no resistance training or planks or anything of that nature but just focus on range of motion exercises.  6-week return with decision for or against CT scanning of the elbow at that time.  Follow-Up Instructions: No follow-ups on file.   Orders:  Orders Placed This Encounter  Procedures   XR Elbow Complete Right (3+View)   No orders of the defined types were placed in this encounter.   Imaging: No results found.  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 Nicolette Bang [facility]   MODIFIED RADICAL NECK DISSECTION  03/06/2019   NECK DISSECTION  03/06/2019   bilateral neck dissection   NERVE GRAFT Left 03/06/2019   left arm lower, Dr. Nicolette Bang at [facility]   ORIF ULNAR FRACTURE Right 12/21/2021   Procedure: OPEN REDUCTION INTERNAL FIXATION (ORIF) RIGHT PROXIMAL ULNAR FRACTURE;  Surgeon: Meredith Pel, MD;  Location: [place];  Service: Orthopedics;  Laterality: Right;   PHARYNGECTOMY  03/06/2019   Dr. Nicolette Bang at [place]  03/06/2019   Dr. Nicolette Bang at [place]  03/06/2019   pharyngoplasty, voice prosthesis/TEP placement, Dr. Nicolette Bang [place]  06/06/2018   ORIF distal radius fracture   Social History   Occupational History   Not on file  Tobacco Use   Smoking status: Never   Smokeless tobacco: Never  Vaping Use  Vaping Use: Never used  Substance and Sexual Activity   Alcohol use: Yes    Comment: wine occ   Drug use: Not Currently   Sexual activity: Not on file

## 2022-03-13 DIAGNOSIS — C329 Malignant neoplasm of larynx, unspecified: Secondary | ICD-10-CM | POA: Diagnosis not present

## 2022-03-20 DIAGNOSIS — Z4589 Encounter for adjustment and management of other implanted devices: Secondary | ICD-10-CM | POA: Diagnosis not present

## 2022-03-20 DIAGNOSIS — Z963 Presence of artificial larynx: Secondary | ICD-10-CM | POA: Diagnosis not present

## 2022-03-20 NOTE — Progress Notes (Signed)
Ms. Whitehorn presents for follow up for treatment for glottic cancer. She completed treatment on 06-01-19.  Pain issues, if any:no pain issues Using a feeding tube?: no feeding tube now Weight changes, if any: gaining weight Wt Readings from Last 3 Encounters:  03/27/22 121 lb 6 oz (55.1 kg)  12/21/21 120 lb (54.4 kg)  12/18/21 120 lb (54.4 kg)    Swallowing issues, if any: big food and pills get stuck, esophagus stretched three times Smoking or chewing tobacco? no Using fluoride trays daily? no Last ENT visit was on: Dr. Nicolette Bang Tuesday before thanksgiving. Did not scope. Other notable issues, if any:  None at this time  Vitals:   03/27/22 1403  BP: 124/79  Pulse: 61  Resp: 18  Temp: 98 F (36.7 C)  SpO2: 100%

## 2022-03-26 ENCOUNTER — Telehealth: Payer: Self-pay | Admitting: *Deleted

## 2022-03-26 NOTE — Telephone Encounter (Signed)
CALLED PATIENT TO REMIND OF LAB AND FU FOR 03-27-22, SPOKE WITH PATIENT AND SHE IS AWARE OF THESE APPTS.

## 2022-03-26 NOTE — Progress Notes (Signed)
Radiation Oncology         [phone] ________________________________  Name: Melody Ward MRN: [id number]  Date: 03/27/2022  DOB: 02-Feb-1951  Follow-Up Visit Note in person  CC: Pcp, No  [name], MD  Diagnosis and Prior Radiotherapy:       ICD-10-CM   1. Malignant neoplasm of glottis ([place])  C32.0 Amb Referral to Survivorship Program      Cancer Staging  Malignant neoplasm of glottis [facility]) Staging form: Larynx - Glottis, AJCC 8th Edition - Pathologic stage from 04/03/2019: Stage III (pT3, pN1, cM0) - Signed by Eppie Gibson, MD on 04/06/2019 Stage prefix: Initial diagnosis - Clinical: No stage assigned - Unsigned  CHIEF COMPLAINT:  Here for follow-up and surveillance of laryngeal cancer  Narrative:     Ms. Westergard presents for 6 month follow up of radiation completed on 06/01/2019 to her neck.   Pain issues, if any: No pain issues Using a feeding tube?: No feeding tube now Weight changes, if any:  Wt Readings from Last 3 Encounters:  03/27/22 121 lb 6 oz (55.1 kg)  12/21/21 120 lb (54.4 kg)  12/18/21 120 lb (54.4 kg)   Swallowing issues, if any: Big food and pills get stuck, esophagus stretched three times. Smoking or chewing tobacco? No Using fluoride trays daily? N/A--Sees her community dentist every 4 months. Seeing her dentist the week before Christmas.  Last ENT visit was on: 03/13/2022 Saw Dr. [name]  "Impression  -- s/p laryngopharyngectomy, BND, and TEP, with a RFFF reconstruction ([name]) on 03/06/19 . Pathology with 2 cm tumor, negative margins, +PNI, no LVI, and 1/47 nodes ( 4 mm, left neck, no ENE). She underwent postop RT, completed 06/01/19 -- There is no evidence of disease on exam today.  -- She sees [name] in Dec. F/u with me in 6 months , certainly sooner if there are problems or questions  -- She is agreeable with this plan. All her questions were answered."  Other notable issues, if any: None today   ALLERGIES:  has No Known  Allergies.  Meds: Current Outpatient Medications  Medication Sig Dispense Refill   ibuprofen (ADVIL) 200 MG tablet Take 3 tablets (600 mg total) by mouth every 8 (eight) hours as needed for moderate pain. 60 tablet 0   cephALEXin (KEFLEX) 500 MG capsule Take one capsule by mouth tonight before bed and one capsule tomorrow morning with breakfast. (Patient not taking: Reported on 03/27/2022) 2 capsule 0   methocarbamol (ROBAXIN) 500 MG tablet Take 1 tablet (500 mg total) by mouth every 8 (eight) hours as needed for muscle spasms. (Patient not taking: Reported on 03/27/2022) 30 tablet 1   oxyCODONE-acetaminophen (PERCOCET) 5-325 MG tablet Take 1 tablet by mouth every 4 (four) hours as needed for severe pain. (Patient not taking: Reported on 03/27/2022) 30 tablet 0   Vitamin D, Ergocalciferol, (DRISDOL) 1.25 MG (50000 UNIT) CAPS capsule Take 1 capsule (50,000 Units total) by mouth every 7 (seven) days. (Patient not taking: Reported on 03/27/2022) 8 capsule 0   No current facility-administered medications for this encounter.    Physical Findings: The patient is in no acute distress. Patient is alert and oriented. Wt Readings from Last 3 Encounters:  03/27/22 121 lb 6 oz (55.1 kg)  12/21/21 120 lb (54.4 kg)  12/18/21 120 lb (54.4 kg)    height is '5\' 3"'$  (1.6 m) and weight is 121 lb 6 oz (55.1 kg). Her oral temperature is 98 F (36.7 C). Her blood  pressure is 124/79 and her pulse is 61. Her respiration is 18 and oxygen saturation is 100%. .   General: Alert and oriented, in no acute distress HEENT: Head is normocephalic. Extraocular movements are intact. Mouth clear. Oropharynx is notable for no mucositis or thrush. Stoma intact, without any lesions. Neck: No palpable lymphadenopathy  Skin: Skin in treatment fields shows satisfactory healing  Heart is regular in rate and rhythm Chest is clear to auscultation bilaterally Abd: Soft, NT/ND Ext: no edema Psychiatric: Judgment and insight are intact.  Affect is appropriate.    Lab Findings: Lab Results  Component Value Date   WBC 8.4 08/17/2020   HGB 15.3 (H) 12/21/2021   HCT 45.0 12/21/2021   MCV 95.6 08/17/2020   PLT 192 08/17/2020    Lab Results  Component Value Date   TSH 3.474 03/27/2022   CMP     Component Value Date/Time   NA 137 12/21/2021 1509   NA 143 03/29/2016 1429   K 3.7 12/21/2021 1509   CL 112 (H) 12/21/2021 1509   CO2 21 (L) 08/17/2020 0414   GLUCOSE 92 12/21/2021 1509   BUN 16 12/21/2021 1509   BUN 9 03/29/2016 1429   CREATININE 0.60 12/21/2021 1509   CREATININE 0.71 10/02/2019 1157   CALCIUM 8.2 (L) 08/17/2020 0414   PROT 6.6 08/17/2020 0414   PROT 7.1 03/29/2016 1429   ALBUMIN 3.7 08/17/2020 0414   ALBUMIN 4.8 03/29/2016 1429   AST 25 08/17/2020 0414   ALT 16 08/17/2020 0414   ALKPHOS 75 08/17/2020 0414   BILITOT 1.4 (H) 08/17/2020 0414   BILITOT 0.8 03/29/2016 1429   GFRNONAA >60 08/17/2020 0414   GFRNONAA >60 10/02/2019 1157   GFRAA >60 10/02/2019 1157    Radiographic Findings: XR Elbow Complete Right (3+View)  Result Date: 03/09/2022 AP lateral oblique radiographs right elbow reviewed.  Hardware unchanged in alignment and position.  Questionable rim of lucency around one of the central screws.  Joint itself remains reduced and in good position.  Some callus formation is present but it is not robust    Impression/Plan:    1) Head and Neck Cancer Status: No evidence of disease recurrence on clinical exam today. Patient is doing very well overall and is likely cured.  2) Nutritional Status: Stable. Gaining weight.   3) Risk Factors:  The patient has been educated about risk factors including alcohol and tobacco abuse; they understand that avoidance of alcohol and tobacco is important to prevent recurrences as well as other cancers; abstaining   4) Swallowing: Functional, continue SLP exercises and esophageal dilatation at the discretion of ENT/GI as needed  5) Dental: Seeing her  dentist in a couple weeks. She is seeing him every 4 months.  Mouth is moist.  6) Thyroid function: check annually; obtained today.  WNL  Lab Results  Component Value Date   TSH 3.474 03/27/2022   7)  She will follow-up with otolaryngology in May. Consultation sent to enroll her in our survivorship program. Plan to begin this in August, 3 months after her otolaryngology follow up. Patient educated on signs or symptoms of disease recurrence. She understands to call back with any questions or concerns.  TSH labwork can continue in our survivorship clinic. She is pleased with this plan.  On service date, in total, I spent 25 minutes on this encounter.  She was seen in person. Note signed day after encounter - minutes pertain to date of encounter only. _____________________________________   Leona Singleton, PA  Jeffre Enriques, MD 

## 2022-03-27 ENCOUNTER — Ambulatory Visit
Admission: RE | Admit: 2022-03-27 | Discharge: 2022-03-27 | Disposition: A | Discharge status: Home / Self Care | Payer: Medicare HMO | Source: Ambulatory Visit | Attending: Radiation Oncology | Admitting: Radiation Oncology

## 2022-03-27 ENCOUNTER — Encounter: Payer: Self-pay | Admitting: Radiation Oncology

## 2022-03-27 VITALS — BP 124/79 | HR 61 | Temp 98.0°F | Resp 18 | Ht 63.0 in | Wt 121.4 lb

## 2022-03-27 DIAGNOSIS — Z923 Personal history of irradiation: Secondary | ICD-10-CM | POA: Insufficient documentation

## 2022-03-27 DIAGNOSIS — C320 Malignant neoplasm of glottis: Secondary | ICD-10-CM | POA: Diagnosis not present

## 2022-03-27 DIAGNOSIS — Z85819 Personal history of malignant neoplasm of unspecified site of lip, oral cavity, and pharynx: Secondary | ICD-10-CM | POA: Insufficient documentation

## 2022-03-27 DIAGNOSIS — Z08 Encounter for follow-up examination after completed treatment for malignant neoplasm: Secondary | ICD-10-CM | POA: Insufficient documentation

## 2022-03-27 DIAGNOSIS — Z8521 Personal history of malignant neoplasm of larynx: Secondary | ICD-10-CM | POA: Diagnosis not present

## 2022-03-27 DIAGNOSIS — R635 Abnormal weight gain: Secondary | ICD-10-CM | POA: Insufficient documentation

## 2022-03-27 DIAGNOSIS — M8580 Other specified disorders of bone density and structure, unspecified site: Secondary | ICD-10-CM | POA: Diagnosis not present

## 2022-03-27 DIAGNOSIS — Z1329 Encounter for screening for other suspected endocrine disorder: Secondary | ICD-10-CM | POA: Diagnosis not present

## 2022-03-27 LAB — TSH: TSH: 3.474 u[IU]/mL (ref 0.350–4.500)

## 2022-03-28 ENCOUNTER — Encounter: Payer: Self-pay | Admitting: Radiation Oncology

## 2022-04-25 ENCOUNTER — Ambulatory Visit: Payer: Medicare HMO | Admitting: Orthopedic Surgery

## 2022-04-25 ENCOUNTER — Ambulatory Visit (INDEPENDENT_AMBULATORY_CARE_PROVIDER_SITE_OTHER): Payer: Medicare HMO

## 2022-04-25 DIAGNOSIS — S52001A Unspecified fracture of upper end of right ulna, initial encounter for closed fracture: Secondary | ICD-10-CM

## 2022-04-25 NOTE — Progress Notes (Signed)
Office Visit Note   Patient: Melody Ward           Date of Birth: June 25, 1950           MRN: [id number] Visit Date: 04/25/2022 Requested by: No referring provider defined for this encounter. PCP: Pcp, No  Subjective: Chief Complaint  Patient presents with   Right Elbow - Routine Post Op    HPI: Melody Ward is a 72 y.o. female who presents to the office reporting right elbow pain.  She underwent right elbow open reduction internal fixation 12/21/2021.  She has minimal pain.  Working on range of motion.  She is 4 months out.  She did have low vitamin D at the time of fixation which was at a level of 21..                ROS: All systems reviewed are negative as they relate to the chief complaint within the history of present illness.  Patient denies fevers or chills.  Assessment & Plan: Visit Diagnoses:  1. Closed fracture of proximal end of right ulna, unspecified fracture morphology, initial encounter     Plan: Impression is well-functioning right elbow following open reduction internal fixation.  She has nearly full flexion and lacks about 12 degrees of full extension.  Radiographs show some fracture consolidation with no lucencies around the screws.  However based on her low vitamin D at the time of fixation I want to let her start weight up to but not exceeding 2 and half pounds.  61-monthreturn with repeat radiographs and final check at that time.  Would likely release her to regular activity at that time but strongly encouraged her not to do push-ups or planks even after she has release.  Follow-Up Instructions: No follow-ups on file.   Orders:  Orders Placed This Encounter  Procedures   XR Elbow Complete Right (3+View)   No orders of the defined types were placed in this encounter.     Procedures: No procedures performed   Clinical Data: No additional findings.  Objective: Vital Signs: There were no vitals taken for this visit.  Physical Exam:  Constitutional:  Patient appears well-developed HEENT:  Head: Normocephalic Eyes:EOM are normal Neck: Normal range of motion Cardiovascular: Normal rate Pulmonary/chest: Effort normal Neurologic: Patient is alert Skin: Skin is warm Psychiatric: Patient has normal mood and affect  Ortho Exam: Ortho exam demonstrates well-healed surgical incision.  Excellent pronation supination.  She has flexion to about 130 and lacks about 12 to 13 degrees of full extension.  Grip strength is nearly symmetric to right versus left.  No crepitus with internal/external rotation flexion extension of that right elbow.  Specialty Comments:  No specialty comments available.  Imaging: No results found.   PMFS History: Patient Active Problem List   Diagnosis Date Noted   Closed displaced fracture of olecranon process with intraarticular extension of right ulna    Subarachnoid bleed ([facility]) 08/30/2020   Subdural hematoma ([facility]) 08/17/2020   Scalp laceration 08/17/2020   Headache 08/17/2020   Retro-orbital pain of right eye 08/17/2020   Syncope 08/17/2020   GERD (gastroesophageal reflux disease) 08/17/2020   History of malignant neoplasm of glottis 08/17/2020   [place] 08/16[place]   [place] 04/[place]   Osteopenia 04/17/2016   Sun-damaged skin 03/31/2016   Past Medical History:  Diagnosis Date   melanoma 1990    Family History  Problem Relation Age of Onset   Hypertension  Mother    Lung cancer Father     Past Surgical History:  Procedure Laterality Date   CESAREAN SECTION     FREE FLAP RADIAL FOREARM  03/06/2019   Dr. Nicolette Bang [facility]   MODIFIED RADICAL NECK DISSECTION  03/06/2019   NECK DISSECTION  03/06/2019   bilateral neck dissection   NERVE GRAFT Left 03/06/2019   left arm lower, Dr[facility]ico[facility] at [facility]   ORIF ULNAR FRACTURE Right 12/21/2021   Procedure: OPEN REDUCTION INTERNAL FIXATION (ORIF) RIGHT PROXIMAL ULNAR FRACTURE;  Surgeon: Meredith Pel, MD;   Location: [place];  Service: Orthopedics;  Laterality: Right;   PHARYNGECTOMY  03/06/2019   Dr. Nicolette Bang at [place]  03/06/2019   Dr. Nicolette Bang at [place]  03/06/2019   pharyngoplasty, voice prosthesis/TEP placement, Dr. Nicolette Bang [place]  06/06/2018   ORIF distal radius fracture   Social History   Occupational History   Not on file  Tobacco Use   Smoking status: Never   Smokeless tobacco: Never  Vaping Use   Vaping Use: Never used  Substance and Sexual Activity   Alcohol use: Yes    Comment: wine occ   Drug use: Not Currently   Sexual activity: Not on file

## 2022-04-28 ENCOUNTER — Encounter: Payer: Self-pay | Admitting: Orthopedic Surgery

## 2022-05-08 DIAGNOSIS — X32XXXD Exposure to sunlight, subsequent encounter: Secondary | ICD-10-CM | POA: Diagnosis not present

## 2022-05-08 DIAGNOSIS — L570 Actinic keratosis: Secondary | ICD-10-CM | POA: Diagnosis not present

## 2022-05-13 IMAGING — CT CT CHEST W/ CM
2 of 3 series · 15 of 36 positions shown, 18 images · IV contrast (Omnipaque or Isovue)
Comparison: No comparison imaging of the chest.

CLINICAL DATA: Head neck cancer, assess treatment response.

EXAM:
CT CHEST WITH CONTRAST
TECHNIQUE: Multidetector CT imaging of the chest was performed during
intravenous contrast administration.
CONTRAST:  75mL OMNIPAQUE IOHEXOL 300 MG/ML  SOLN

[Series 2: routine chest with · axial · 0.54mm/px · z∈[+1113,+1379]mm · 12 of 157 slices shown, 15 images]
[im 12/157  mediastinal]
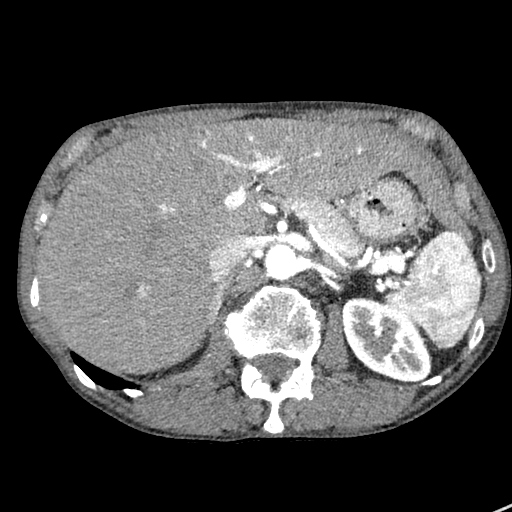
[im 12/157  lung]
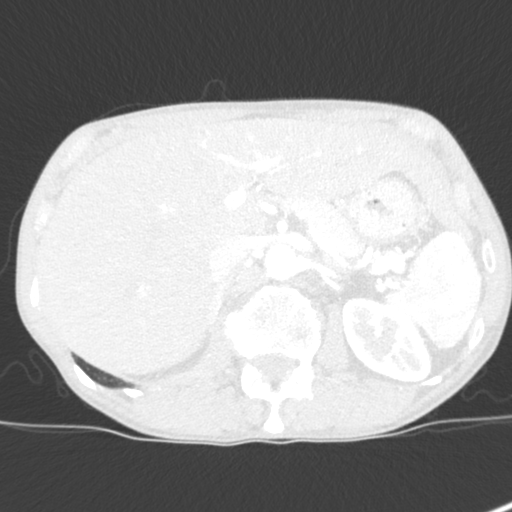
[im 24/157  lung]
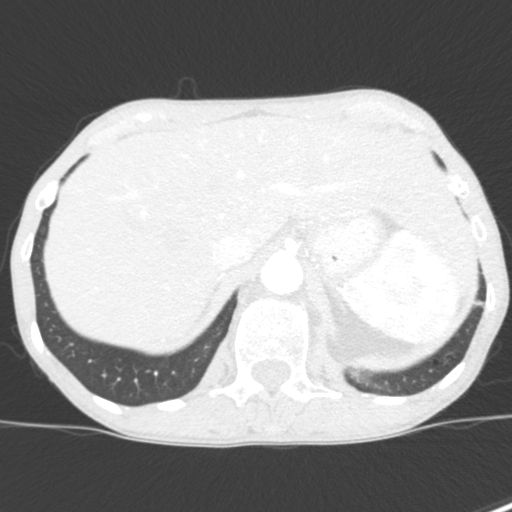
[im 35/157  lung]
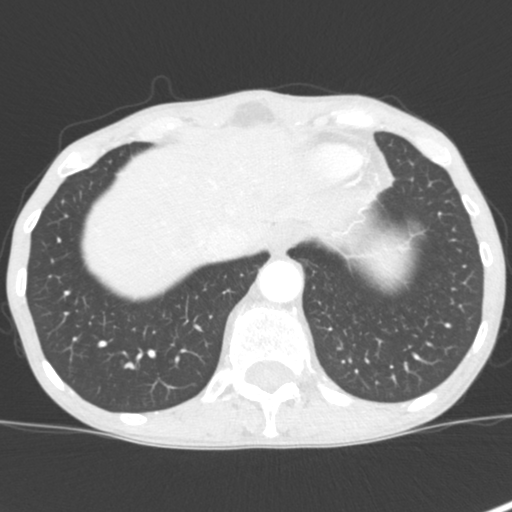
[im 47/157  lung]
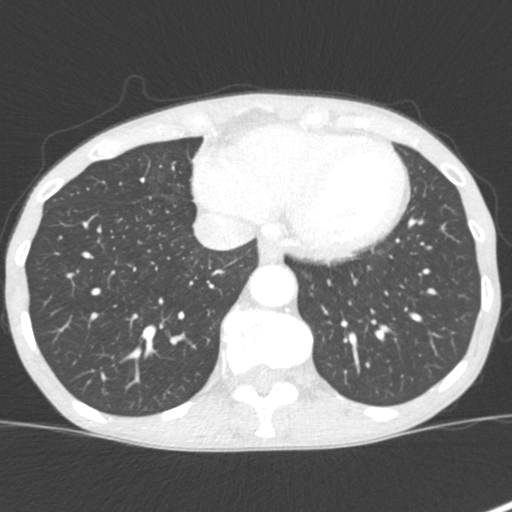
[im 58/157  mediastinal]
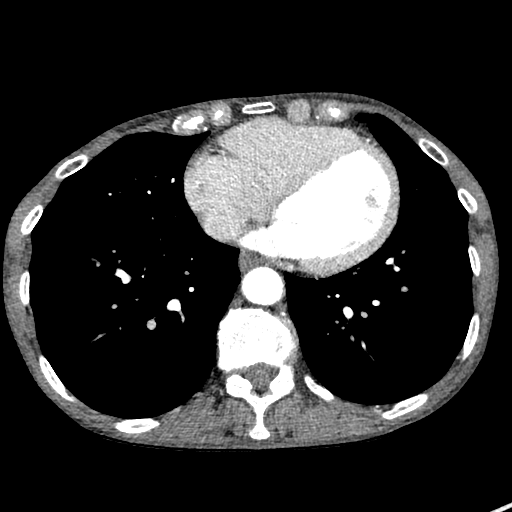
[im 58/157  lung]
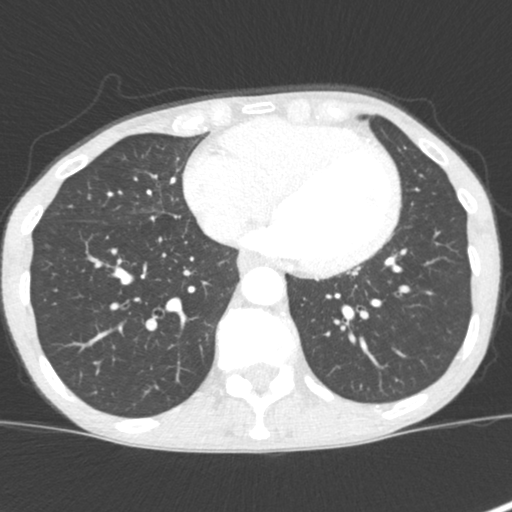
[im 70/157  lung]
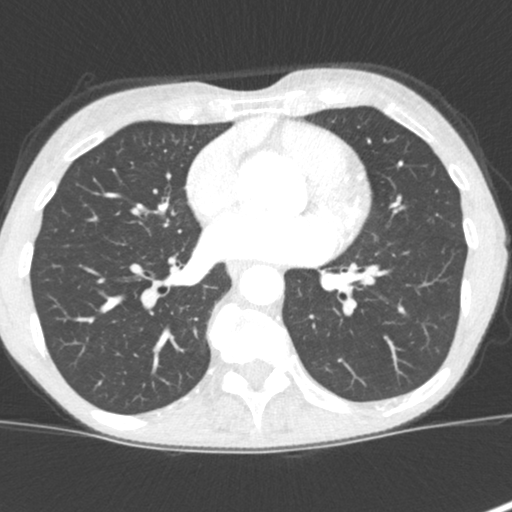
[im 87/157  lung]
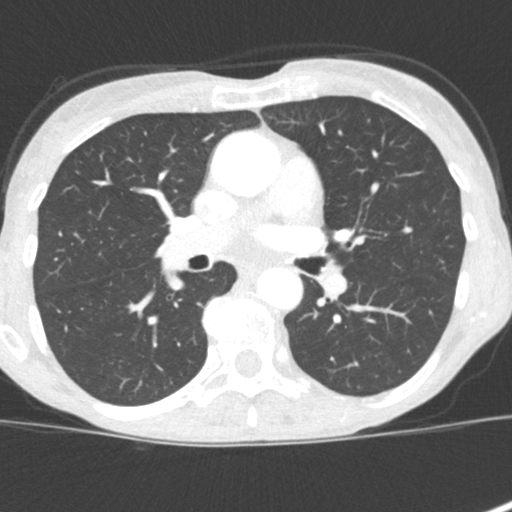
[im 99/157  lung]
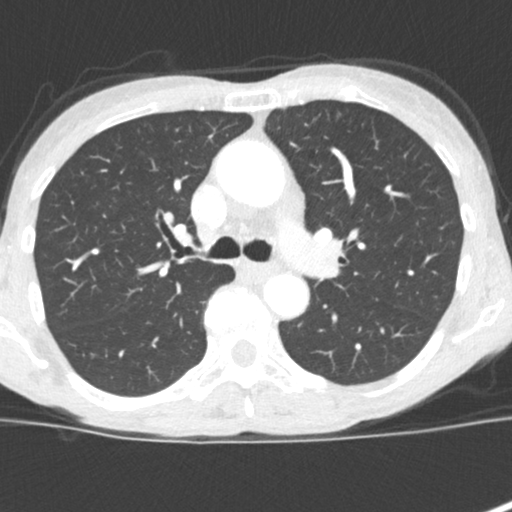
[im 110/157  mediastinal]
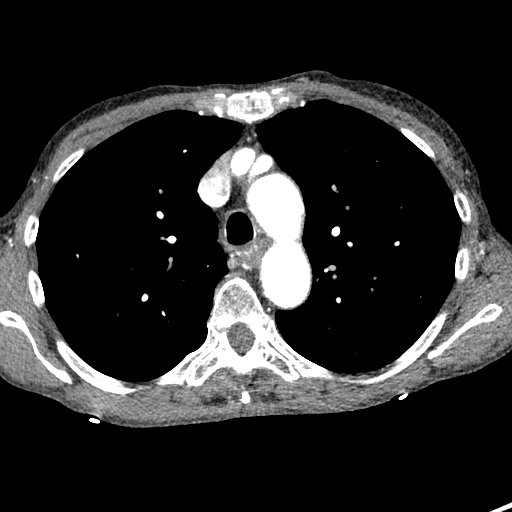
[im 110/157  lung]
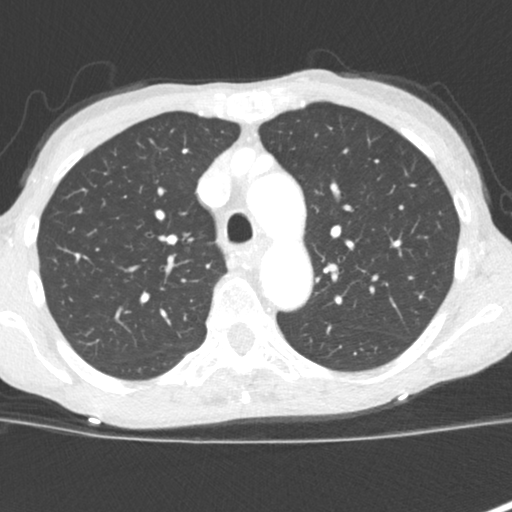
[im 122/157  lung]
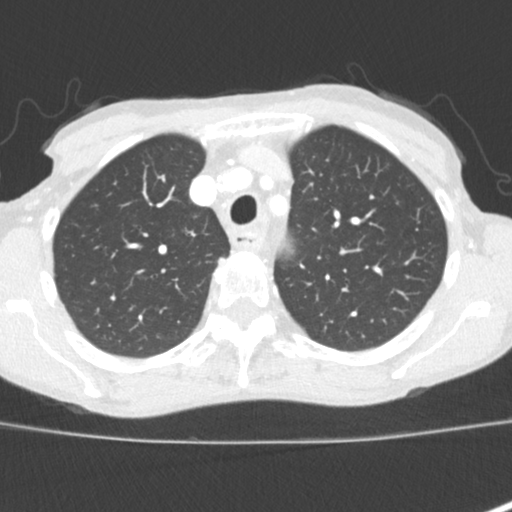
[im 133/157  lung]
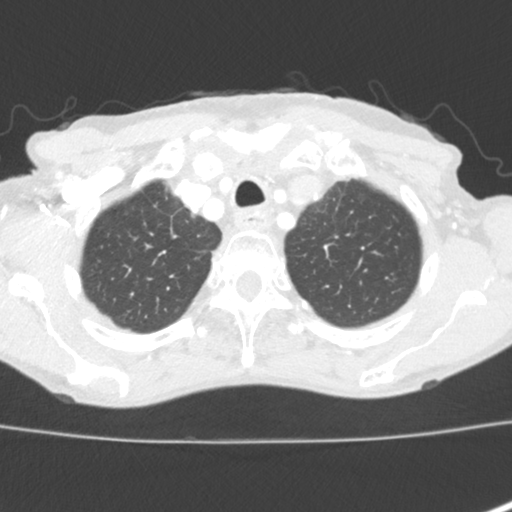
[im 145/157  lung]
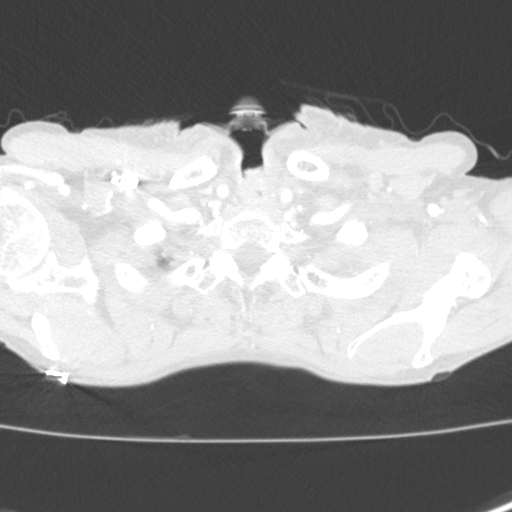

[Series 5: coronal · coronal · 0.56mm/px · 3 of 117 slices shown]
[im 24/117  lung]
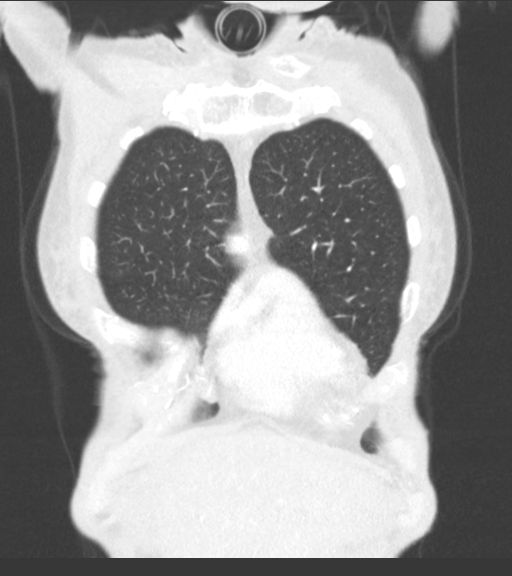
[im 47/117  lung]
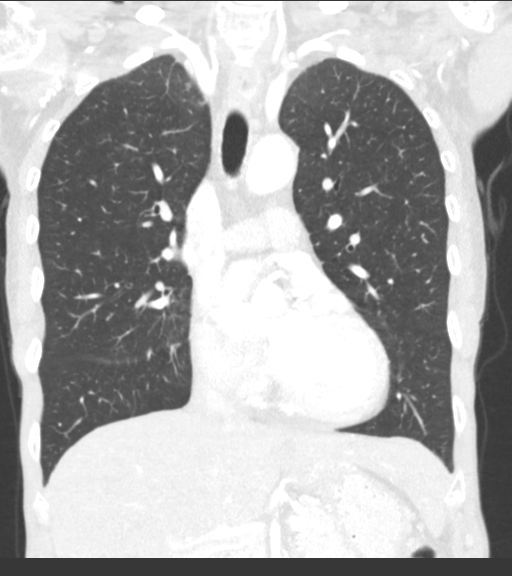
[im 70/117  lung]
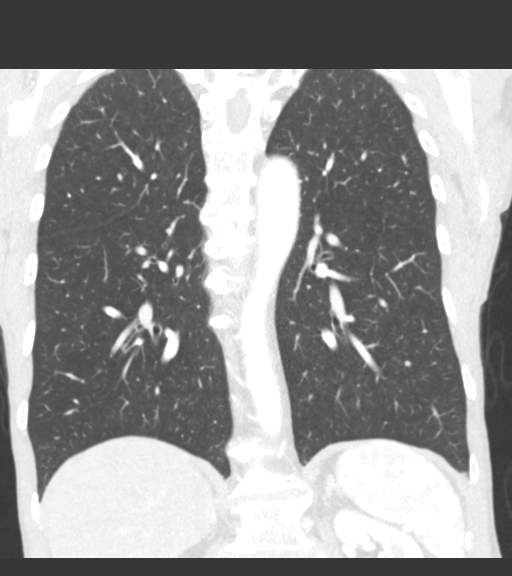

[15 of 36 positions shown; findings below may reference images not displayed]

FINDINGS: Cardiovascular: Heart size normal. Aortic caliber mildly dilated
cm with minimal calcified atheromatous plaque in the thoracic aorta
and noncalcified plaque as well. Central pulmonary vasculature
unremarkable on venous phase assessment.

Mediastinum/Nodes: Post laryngectomy at the superior mediastinum.
Cannula at the site of tracheostomy partially visualized. Stranding
in the soft tissues may relate to prior post treatment changes. No
fluid collection. Area is incompletely imaged. Refer to CT neck from
[REDACTED] for further detail.

No mediastinal lymphadenopathy.  No hilar lymphadenopathy.

Lungs/Pleura: Emphysema. No consolidation. No pleural effusion.
Airways are patent. No suspicious nodule or mass.

Upper Abdomen: Area of enhancement in the LEFT hepatic lobe
measuring 9 mm with some suggestion of peripheral, nodular and
discontinuous enhancement but lesion not well displayed on current
phase of contrast (image 130, series 2) another tiny hypervascular
focus in the dome of the RIGHT hemi liver (image 122, series 2) 4 mm
adrenal glands are normal. No acute upper abdominal process.

Musculoskeletal: No acute bone finding. No destructive bone process.
Spinal degenerative change.
IMPRESSION: 1. Postoperative changes in the neck better seen on recent neck CT
2. No suspicious nodule or mass.
3. Area of enhancement in the LEFT hepatic lobe measuring 9 mm with
some suggestion of peripheral, nodular and discontinuous enhancement
but lesion not well displayed on current phase of contrast. Another
tiny hypervascular focus in the dome of the RIGHT hemi liver. Given
lack of definitive characterization of the small area of enhancement
in the LEFT hepatic lobe with suggest a liver MRI for further
assessment or comparison with prior, remote imaging of the abdomen.
4. Emphysema and aortic atherosclerosis.

Aortic Atherosclerosis (ZR658-KT6.6) and Emphysema (ZR658-S0G.J).

## 2022-05-28 DIAGNOSIS — Z930 Tracheostomy status: Secondary | ICD-10-CM | POA: Diagnosis not present

## 2022-05-29 ENCOUNTER — Encounter: Payer: Self-pay | Admitting: *Deleted

## 2022-06-20 DIAGNOSIS — R1310 Dysphagia, unspecified: Secondary | ICD-10-CM | POA: Diagnosis not present

## 2022-06-21 ENCOUNTER — Encounter: Payer: Self-pay | Admitting: Radiology

## 2022-06-27 ENCOUNTER — Ambulatory Visit (INDEPENDENT_AMBULATORY_CARE_PROVIDER_SITE_OTHER): Payer: Medicare HMO

## 2022-06-27 ENCOUNTER — Ambulatory Visit: Payer: Medicare HMO | Admitting: Orthopedic Surgery

## 2022-06-27 DIAGNOSIS — M25561 Pain in right knee: Secondary | ICD-10-CM

## 2022-06-27 DIAGNOSIS — S52001A Unspecified fracture of upper end of right ulna, initial encounter for closed fracture: Secondary | ICD-10-CM

## 2022-06-27 DIAGNOSIS — M1711 Unilateral primary osteoarthritis, right knee: Secondary | ICD-10-CM | POA: Diagnosis not present

## 2022-06-30 ENCOUNTER — Encounter: Payer: Self-pay | Admitting: Orthopedic Surgery

## 2022-06-30 MED ORDER — LIDOCAINE HCL 1 % IJ SOLN
5.0000 mL | INTRAMUSCULAR | Status: AC | PRN
  Administered 2022-06-27: 5 mL

## 2022-06-30 MED ORDER — METHYLPREDNISOLONE ACETATE 40 MG/ML IJ SUSP
40.0000 mg | INTRAMUSCULAR | Status: AC | PRN
  Administered 2022-06-27: 40 mg via INTRA_ARTICULAR

## 2022-06-30 MED ORDER — BUPIVACAINE HCL 0.25 % IJ SOLN
4.0000 mL | INTRAMUSCULAR | Status: AC | PRN
  Administered 2022-06-27: 4 mL via INTRA_ARTICULAR

## 2022-06-30 NOTE — Progress Notes (Signed)
Office Visit Note   Patient: Melody Ward           Date of Birth: [birth year]           MRN: [id number] Visit Date: 06/27/2022 Requested by: No referring provider defined for this encounter. PCP: Pcp, No  Subjective: Chief Complaint  Patient presents with   Right Knee - Pain   Right Elbow - Follow-up    right elbow open reduction internal fixation 12/21/2021    HPI: Melody Ward is a 72 y.o. female who presents to the office reporting improving right elbow symptoms and right knee pain which is chronic.  Overall she is doing well with right elbow open reduction internal fixation 12/21/2021.  Patient also reports right knee pain with weakness giving way but no locking.  Describes pain primarily medially.  Reports some grinding.  No definite mechanical symptoms.                ROS: All systems reviewed are negative as they relate to the chief complaint within the history of present illness.  Patient denies fevers or chills.  Assessment & Plan: Visit Diagnoses:  1. Right knee pain, unspecified chronicity   2. Closed fracture of proximal end of right ulna, unspecified fracture morphology, initial encounter     Plan: Impression is patient is doing well following right elbow open reduction internal fixation.  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  Injection performed today.  Follow-up as needed.  Follow-Up Instructions: No follow-ups on file.   Orders:  Orders Placed This Encounter  Procedures   XR KNEE 3 VIEW RIGHT   XR Elbow Complete Right (3+View)   No orders of the defined types were placed in this encounter.     Procedures: Large Joint Inj: R knee on 06/27/2022 8:31 PM Indications: diagnostic evaluation, joint swelling and pain Details: 18 G 1.5 in needle, superolateral approach  Arthrogram: No  Medications: 5 mL lidocaine 1 %; 40 mg methylPREDNISolone acetate 40 MG/ML; 4 mL bupivacaine 0.25 % Outcome: tolerated well, no immediate complications Procedure,  treatment alternatives, risks and benefits explained, specific risks discussed. Consent was given by the patient. Immediately prior to procedure a time out was called to verify the correct patient, procedure, equipment, support staff and site/side marked as required. Patient was prepped and draped in the usual sterile fashion.       Clinical Data: No additional findings.  Objective: Vital Signs: There were no vitals taken for this visit.  Physical Exam:  Constitutional: Patient appears well-developed HEENT:  Head: Normocephalic Eyes:EOM are normal Neck: Normal range of motion Cardiovascular: Normal rate Pulmonary/chest: Effort normal Neurologic: Patient is alert Skin: Skin is warm Psychiatric: Patient has normal mood and affect  Ortho Exam: Ortho exam demonstrates range of motion on the right knee of 5-1 20.  Medial greater than lateral joint line tenderness.  Mild effusion is present.  Extensor mechanism intact.  No groin pain with internal or external rotation of the leg.  Right elbow demonstrates range of motion of 20-1 25 with intact incision and good motor or sensory function to the hand.  Specialty Comments:  No specialty comments available.  Imaging: No results found.   PMFS History: Patient Active Problem List   Diagnosis Date Noted   Closed displaced fracture of olecranon process with intraarticular extension of right ulna    Subarachnoid bleed ([place]) 08/30/2020   Subdural hematoma ([facility]) 08/17/2020   Scalp laceration 08/17/2020   Headache 08/17/2020  Retro-orbital pain of right eye 08/17/2020   Syncope 08/17/2020   GERD (gastroesophageal reflux disease) 08/17/2020   History of malignant neoplasm of glottis 08/17/2020   Subarachnoid hemorrhage ([place]) 08/16/2020   Malignant neoplasm of glottis ([place]) 04/06/2019   Osteopenia 04/17/2016   Sun-damaged skin 03/31/2016   Past Medical History:  Diagnosis Date   melanoma 1990    Family History  Problem Relation  Age of Onset   Hypertension Mother    Lung cancer Father     Past Surgical History:  Procedure Laterality Date   CESAREAN SECTION     FREE FLAP RADIAL FOREARM  03/06/2019   Dr. Nicolette Bang [place]  03/06/2019   NECK DISSECTION  03/06/2019   bilateral neck dissection   NERVE GRAFT Left 03/06/2019   left arm lower, [facility]   ORIF [place] Right 12/21/2021   Procedure: OPEN REDUCTION INTERNAL FIXATION (ORIF) RIGHT PROXIMAL ULNAR FRACTURE;  Surgeon: Meredith Pel, MD;  Location: [place];  Service: Orthopedics;  Laterality: Right;   PHARYNGECTOMY  03/06/2019   Dr. Nicolette Bang at [place]  03/06/2019   Dr. Nicolette Bang at [place]  03/06/2019   pharyngoplasty, voice prosthesis/TEP placement, Dr. Nicolette Bang [place]  06/06/2018   ORIF distal radius fracture   Social History   Occupational History   Not on file  Tobacco Use   Smoking status: Never   Smokeless tobacco: Never  Vaping Use   Vaping Use: Never used  Substance and Sexual Activity   Alcohol use: Yes    Comment: wine occ   Drug use: Not Currently   Sexual activity: Not on file

## 2022-07-16 DIAGNOSIS — Z930 Tracheostomy status: Secondary | ICD-10-CM | POA: Diagnosis not present

## 2022-07-17 DIAGNOSIS — L570 Actinic keratosis: Secondary | ICD-10-CM | POA: Diagnosis not present

## 2022-07-17 DIAGNOSIS — X32XXXD Exposure to sunlight, subsequent encounter: Secondary | ICD-10-CM | POA: Diagnosis not present

## 2022-08-07 DIAGNOSIS — H401131 Primary open-angle glaucoma, bilateral, mild stage: Secondary | ICD-10-CM | POA: Diagnosis not present

## 2022-08-07 DIAGNOSIS — H25093 Other age-related incipient cataract, bilateral: Secondary | ICD-10-CM | POA: Diagnosis not present

## 2022-08-22 DIAGNOSIS — Z930 Tracheostomy status: Secondary | ICD-10-CM | POA: Diagnosis not present

## 2022-09-11 DIAGNOSIS — C329 Malignant neoplasm of larynx, unspecified: Secondary | ICD-10-CM | POA: Diagnosis not present

## 2022-10-10 DIAGNOSIS — D225 Melanocytic nevi of trunk: Secondary | ICD-10-CM | POA: Diagnosis not present

## 2022-10-10 DIAGNOSIS — Z08 Encounter for follow-up examination after completed treatment for malignant neoplasm: Secondary | ICD-10-CM | POA: Diagnosis not present

## 2022-10-10 DIAGNOSIS — C44519 Basal cell carcinoma of skin of other part of trunk: Secondary | ICD-10-CM | POA: Diagnosis not present

## 2022-10-10 DIAGNOSIS — Z1283 Encounter for screening for malignant neoplasm of skin: Secondary | ICD-10-CM | POA: Diagnosis not present

## 2022-10-10 DIAGNOSIS — C44521 Squamous cell carcinoma of skin of breast: Secondary | ICD-10-CM | POA: Diagnosis not present

## 2022-10-10 DIAGNOSIS — L570 Actinic keratosis: Secondary | ICD-10-CM | POA: Diagnosis not present

## 2022-10-10 DIAGNOSIS — Z85820 Personal history of malignant melanoma of skin: Secondary | ICD-10-CM | POA: Diagnosis not present

## 2022-10-10 DIAGNOSIS — X32XXXD Exposure to sunlight, subsequent encounter: Secondary | ICD-10-CM | POA: Diagnosis not present

## 2022-10-12 DIAGNOSIS — Z930 Tracheostomy status: Secondary | ICD-10-CM | POA: Diagnosis not present

## 2022-11-14 DIAGNOSIS — Z4689 Encounter for fitting and adjustment of other specified devices: Secondary | ICD-10-CM | POA: Diagnosis not present

## 2022-11-14 DIAGNOSIS — R490 Dysphonia: Secondary | ICD-10-CM | POA: Diagnosis not present

## 2022-11-14 DIAGNOSIS — Z9002 Acquired absence of larynx: Secondary | ICD-10-CM | POA: Diagnosis not present

## 2022-11-14 DIAGNOSIS — C329 Malignant neoplasm of larynx, unspecified: Secondary | ICD-10-CM | POA: Diagnosis not present

## 2022-11-14 DIAGNOSIS — Z963 Presence of artificial larynx: Secondary | ICD-10-CM | POA: Diagnosis not present

## 2022-11-27 DIAGNOSIS — D0439 Carcinoma in situ of skin of other parts of face: Secondary | ICD-10-CM | POA: Diagnosis not present

## 2022-11-27 DIAGNOSIS — D0421 Carcinoma in situ of skin of right ear and external auricular canal: Secondary | ICD-10-CM | POA: Diagnosis not present

## 2022-11-27 DIAGNOSIS — L570 Actinic keratosis: Secondary | ICD-10-CM | POA: Diagnosis not present

## 2022-11-27 DIAGNOSIS — L928 Other granulomatous disorders of the skin and subcutaneous tissue: Secondary | ICD-10-CM | POA: Diagnosis not present

## 2022-11-27 DIAGNOSIS — X32XXXD Exposure to sunlight, subsequent encounter: Secondary | ICD-10-CM | POA: Diagnosis not present

## 2022-11-29 ENCOUNTER — Telehealth: Payer: Self-pay | Admitting: Nurse Practitioner

## 2022-11-29 NOTE — Telephone Encounter (Signed)
Contacted patient to scheduled appointments. Patient is aware of appointments that are scheduled.   

## 2022-12-02 NOTE — Progress Notes (Unsigned)
CLINIC:  Survivorship  Patient Care Team: Pcp, No as PCP - General Corey Skains, MD as Referring Physician (Otolaryngology) Lonie Peak, MD as Attending Physician (Radiation Oncology) Barron Alvine, CCC-SLP as Speech Language Pathologist (Speech Pathology) Barrie Folk, RN (Inactive) as Oncology Nurse Navigator Anabel Bene, RD as Dietitian (Nutrition) Jeanella Craze, Remi Deter, PT as Physical Therapist (Physical Therapy) Ernestene Mention, LCSW as Social Worker Malmfelt, Lise Auer, RN as Oncology Nurse Navigator   REASON FOR VISIT:  Routine follow-up for history of head & neck cancer.  BRIEF ONCOLOGIC HISTORY:  Oncology History  Malignant neoplasm of glottis (HCC)  04/03/2019 Cancer Staging   Staging form: Larynx - Glottis, AJCC 8th Edition - Pathologic stage from 04/03/2019: Stage III (pT3, pN1, cM0) - Signed by Lonie Peak, MD on 04/06/2019   04/06/2019 Initial Diagnosis   Malignant neoplasm of glottis (HCC)    Miscellaneous   -s/p laryngopharyngectomy, BND, and TEP, with a RFFF reconstruction (Dr Christoper Allegra) on 03/06/19 -Pathology with 2 cm tumor, negative margins, +PNI, no LVI, and 1/47 nodes ( 4 mm, left neck, no ENE) -S/p adjuvant RT (Dr. Basilio Cairo), completed 06/01/19    09/02/2019 Imaging   Restaging CT neck: IMPRESSION: NI-RADS category 1. No CT evidence of residual or recurrent disease. Extensive postsurgical changes as outlined above.     12/03/2022 Survivorship   Survivorship visit with Santiago Glad, NP      INTERVAL HISTORY: Ms. Niederhauser presents to survivorship clinic. Doing well in general, no significant changes in her health. Good energy and appetite, gaining weight. Tolerating solids now for the most part, needs SLP every 3 months or so to replace TEP. Seeing dentist later today. Needs PCP for routine care and cancer screenings.   -Pain: None -Nutrition/Diet: Normal  -Dysphagia?: None currently, required stoma dilation PRN -Dental issues?: Yes  (next visit later today); using fluoride trays? No  -Last TSH: 03/2022 WNL -Weight:  since (last visit) GAIN  -Last ENT visit:  Dr. Hezzie Bump 09/11/22 -Last Rad Onc visit: Dr. Basilio Cairo 03/27/22 -Last Dentist visit: Next visit is today (q4 months)    ADDITIONAL REVIEW OF SYSTEMS:  ROS    CURRENT MEDICATIONS:  Current Outpatient Medications on File Prior to Visit  Medication Sig Dispense Refill   cephALEXin (KEFLEX) 500 MG capsule Take one capsule by mouth tonight before bed and one capsule tomorrow morning with breakfast. 2 capsule 0   ibuprofen (ADVIL) 200 MG tablet Take 3 tablets (600 mg total) by mouth every 8 (eight) hours as needed for moderate pain. 60 tablet 0   methocarbamol (ROBAXIN) 500 MG tablet Take 1 tablet (500 mg total) by mouth every 8 (eight) hours as needed for muscle spasms. 30 tablet 1   oxyCODONE-acetaminophen (PERCOCET) 5-325 MG tablet Take 1 tablet by mouth every 4 (four) hours as needed for severe pain. 30 tablet 0   Vitamin D, Ergocalciferol, (DRISDOL) 1.25 MG (50000 UNIT) CAPS capsule Take 1 capsule (50,000 Units total) by mouth every 7 (seven) days. 8 capsule 0   No current facility-administered medications on file prior to visit.    ALLERGIES:  No Known Allergies   PHYSICAL EXAM:  Vitals:   12/03/22 1049  BP: 116/79  Pulse: 62  Resp: 17  Temp: 98 F (36.7 C)  SpO2: 100%   Filed Weights   12/03/22 1049  Weight: 124 lb 6.4 oz (56.4 kg)    General: Well-nourished, well-appearing female in no acute distress.  HEENT:  Sclerae anicteric. Oral mucosa is pink  and moist without lesions.  Tongue pink, moist, and midline. Oropharynx is pink and moist, without lesions. stoma WNL Lymph: No preauricular, postauricular, cervical, supraclavicular, or infraclavicular lymphadenopathy noted on palpation.   Neck: No palpable masses. Cardiovascular: Normal rate and rhythm. Respiratory: Clear; breathing non-labored.  GI: Abdomen soft and round. Non-tender,  non-distended. Bowel sounds normoactive.  Neuro: No focal deficits. Steady gait.   Psych: Normal mood and affect for situation. Extremities: No edema.  Skin: Warm and dry.    LABORATORY DATA:  None at this visit.  DIAGNOSTIC IMAGING:  None at this visit.    ASSESSMENT & PLAN:  Ms. Lennert is a pleasant 72 y.o. female with history of laryngeal cancer, diagnosed in 03/2019;  treated with laryngectomy and adjuvant radiation completed treatment on 06/01/19.  Patient presents to survivorship clinic today for routine long term follow-up and first meeting.   1. Laryngeal cancer:  Ms. Henshaw is clinically without evidence of disease or recurrence on physical exam today.     2. Nutritional status: Ms. Plack reports that she is currently able to consume adequate nutrition by mouth.  Gaining weight. Encouraged to continue.  3. At risk for dysphagia: Given Ms. Husser's treatment for head/neck cancer, which included surgery and radiation therapy, she is at risk for chronic dysphagia. Currently doing ok. Previously bulky foods and pills were getting stuck. S/p SLP eval/mgmt and esophageal dilation. No intervention needed at this time.  4.  At risk for neck lymphedema:  no evidence of LE   5.  At risk for hypothyroidism: TSH normal 03/2022. We discussed that she will continue to have serial TSH monitoring for at least the next 5 years as part of her routine follow-up and post-cancer treatment care.   6. At risk for tooth decay/dental concerns: Sees dentist q4 months, next visit later today. She does not recall using fluoride trays. Encouraged her to discuss this with her dentist today.  7.  Lung cancer screening:  [facility] now offers eligible patients lung cancer screening with a low-dose chest CT to aid in early detection, provide more effective treatment options, and ultimately improve survival benefits for patients diagnosed early.  Below is the selection criteria for screening:  Medicare  patients: 55-77 years; privately insured patients 55-80 years. Active or former smokers who have quit within the last 15 years. 30+ pack-year history of smoking  Exclusion criteria - No signs/symptoms of lung cancer (i.e., no recent history of hemoptysis and no unexplained weight loss >15 pounds in the last 6 months). Willing and healthy enough to undergo biopsies/surgery if needed.  Ms. Demars currently does/does not meet criteria for lung cancer screening.    8. Tobacco & alcohol use: Ms. Tawney reports that she is a never smoker and does not consume much alcohol.   9. Health maintenance and wellness promotion: Cancer patients who consume a diet rich in fruits and vegetables have better overall health and decreased risk of cancer recurrence. Ms. Miga was encouraged to consume 5-7 servings of fruits and vegetables per day, as tolerated. Ms. Kinderman was also encouraged to engage in moderate to vigorous exercise for 30 minutes per day most days of the week. She is not up to date on age appropriate/routine health care or cancer screenings. She needs routine labs, DEXA, colonoscopy, mammogram at the very least. I referred her to PCP in [place] per her request.  10. Support services/counseling: Ms Sniffen appears well adjusted and coping well with her history of cancer and treatment. She does  not need referrals today.  The patient was offered support today through active listening and expressive supportive counseling.     Dispo:  -See Dr. Hezzie Bump (ENT) in 02/2023 as scheduled -Return to cancer center to see Survivorship NP in 1 year    Orders Placed This Encounter  Procedures   Ambulatory referral to Internal Medicine    Referral Priority:   Routine    Referral Type:   Consultation    Referral Reason:   Specialty Services Required    Requested Specialty:   Internal Medicine    Number of Visits Requested:   1     A total of 30 minutes was spent in the face-to-face care of this patient,  with greater than 50% of that time spent in counseling and care-coordination.    Santiago Glad, NP Survivorship Program [facility] [phone]

## 2022-12-03 ENCOUNTER — Other Ambulatory Visit: Payer: Self-pay

## 2022-12-03 ENCOUNTER — Inpatient Hospital Stay: Payer: Medicare HMO | Attending: Nurse Practitioner | Admitting: Nurse Practitioner

## 2022-12-03 ENCOUNTER — Encounter: Payer: Self-pay | Admitting: Nurse Practitioner

## 2022-12-03 VITALS — BP 116/79 | HR 62 | Temp 98.0°F | Resp 17 | Ht 63.0 in | Wt 124.4 lb

## 2022-12-03 DIAGNOSIS — Z923 Personal history of irradiation: Secondary | ICD-10-CM | POA: Diagnosis not present

## 2022-12-03 DIAGNOSIS — C320 Malignant neoplasm of glottis: Secondary | ICD-10-CM | POA: Insufficient documentation

## 2022-12-04 ENCOUNTER — Inpatient Hospital Stay: Payer: Medicare HMO | Admitting: Nurse Practitioner

## 2022-12-25 DIAGNOSIS — L570 Actinic keratosis: Secondary | ICD-10-CM | POA: Diagnosis not present

## 2022-12-25 DIAGNOSIS — X32XXXD Exposure to sunlight, subsequent encounter: Secondary | ICD-10-CM | POA: Diagnosis not present

## 2022-12-25 DIAGNOSIS — L0100 Impetigo, unspecified: Secondary | ICD-10-CM | POA: Diagnosis not present

## 2022-12-26 DIAGNOSIS — Z9002 Acquired absence of larynx: Secondary | ICD-10-CM | POA: Diagnosis not present

## 2022-12-26 DIAGNOSIS — R490 Dysphonia: Secondary | ICD-10-CM | POA: Diagnosis not present

## 2022-12-26 DIAGNOSIS — C329 Malignant neoplasm of larynx, unspecified: Secondary | ICD-10-CM | POA: Diagnosis not present

## 2022-12-26 DIAGNOSIS — Z963 Presence of artificial larynx: Secondary | ICD-10-CM | POA: Diagnosis not present

## 2022-12-26 DIAGNOSIS — Z4689 Encounter for fitting and adjustment of other specified devices: Secondary | ICD-10-CM | POA: Diagnosis not present

## 2023-01-08 DIAGNOSIS — L570 Actinic keratosis: Secondary | ICD-10-CM | POA: Diagnosis not present

## 2023-01-08 DIAGNOSIS — X32XXXD Exposure to sunlight, subsequent encounter: Secondary | ICD-10-CM | POA: Diagnosis not present

## 2023-01-08 DIAGNOSIS — L905 Scar conditions and fibrosis of skin: Secondary | ICD-10-CM | POA: Diagnosis not present

## 2023-01-22 DIAGNOSIS — H5203 Hypermetropia, bilateral: Secondary | ICD-10-CM | POA: Diagnosis not present

## 2023-02-05 DIAGNOSIS — H401131 Primary open-angle glaucoma, bilateral, mild stage: Secondary | ICD-10-CM | POA: Diagnosis not present

## 2023-02-05 DIAGNOSIS — H25093 Other age-related incipient cataract, bilateral: Secondary | ICD-10-CM | POA: Diagnosis not present

## 2023-03-19 DIAGNOSIS — Z9002 Acquired absence of larynx: Secondary | ICD-10-CM | POA: Diagnosis not present

## 2023-03-19 DIAGNOSIS — C329 Malignant neoplasm of larynx, unspecified: Secondary | ICD-10-CM | POA: Diagnosis not present

## 2023-03-19 DIAGNOSIS — Z963 Presence of artificial larynx: Secondary | ICD-10-CM | POA: Diagnosis not present

## 2023-03-19 DIAGNOSIS — R490 Dysphonia: Secondary | ICD-10-CM | POA: Diagnosis not present

## 2023-03-19 DIAGNOSIS — Z4689 Encounter for fitting and adjustment of other specified devices: Secondary | ICD-10-CM | POA: Diagnosis not present

## 2023-04-18 DIAGNOSIS — Z930 Tracheostomy status: Secondary | ICD-10-CM | POA: Diagnosis not present

## 2023-06-19 DIAGNOSIS — R490 Dysphonia: Secondary | ICD-10-CM | POA: Diagnosis not present

## 2023-06-19 DIAGNOSIS — C329 Malignant neoplasm of larynx, unspecified: Secondary | ICD-10-CM | POA: Diagnosis not present

## 2023-06-19 DIAGNOSIS — Z4689 Encounter for fitting and adjustment of other specified devices: Secondary | ICD-10-CM | POA: Diagnosis not present

## 2023-06-19 DIAGNOSIS — Z963 Presence of artificial larynx: Secondary | ICD-10-CM | POA: Diagnosis not present

## 2023-06-19 DIAGNOSIS — Z9002 Acquired absence of larynx: Secondary | ICD-10-CM | POA: Diagnosis not present

## 2023-07-16 DIAGNOSIS — Z963 Presence of artificial larynx: Secondary | ICD-10-CM | POA: Diagnosis not present

## 2023-07-16 DIAGNOSIS — Z9002 Acquired absence of larynx: Secondary | ICD-10-CM | POA: Diagnosis not present

## 2023-07-16 DIAGNOSIS — R490 Dysphonia: Secondary | ICD-10-CM | POA: Diagnosis not present

## 2023-07-16 DIAGNOSIS — C329 Malignant neoplasm of larynx, unspecified: Secondary | ICD-10-CM | POA: Diagnosis not present

## 2023-07-16 DIAGNOSIS — Z4689 Encounter for fitting and adjustment of other specified devices: Secondary | ICD-10-CM | POA: Diagnosis not present

## 2023-08-06 DIAGNOSIS — H25093 Other age-related incipient cataract, bilateral: Secondary | ICD-10-CM | POA: Diagnosis not present

## 2023-08-06 DIAGNOSIS — H401131 Primary open-angle glaucoma, bilateral, mild stage: Secondary | ICD-10-CM | POA: Diagnosis not present

## 2023-08-29 DIAGNOSIS — R490 Dysphonia: Secondary | ICD-10-CM | POA: Diagnosis not present

## 2023-08-29 DIAGNOSIS — C329 Malignant neoplasm of larynx, unspecified: Secondary | ICD-10-CM | POA: Diagnosis not present

## 2023-08-29 DIAGNOSIS — Z963 Presence of artificial larynx: Secondary | ICD-10-CM | POA: Diagnosis not present

## 2023-08-29 DIAGNOSIS — Z9002 Acquired absence of larynx: Secondary | ICD-10-CM | POA: Diagnosis not present

## 2023-08-29 DIAGNOSIS — Z4689 Encounter for fitting and adjustment of other specified devices: Secondary | ICD-10-CM | POA: Diagnosis not present

## 2023-09-24 DIAGNOSIS — C329 Malignant neoplasm of larynx, unspecified: Secondary | ICD-10-CM | POA: Diagnosis not present

## 2023-10-23 DIAGNOSIS — L570 Actinic keratosis: Secondary | ICD-10-CM | POA: Diagnosis not present

## 2023-10-23 DIAGNOSIS — Z85828 Personal history of other malignant neoplasm of skin: Secondary | ICD-10-CM | POA: Diagnosis not present

## 2023-10-23 DIAGNOSIS — D045 Carcinoma in situ of skin of trunk: Secondary | ICD-10-CM | POA: Diagnosis not present

## 2023-10-23 DIAGNOSIS — X32XXXD Exposure to sunlight, subsequent encounter: Secondary | ICD-10-CM | POA: Diagnosis not present

## 2023-10-23 DIAGNOSIS — Z08 Encounter for follow-up examination after completed treatment for malignant neoplasm: Secondary | ICD-10-CM | POA: Diagnosis not present

## 2023-11-12 DIAGNOSIS — Z930 Tracheostomy status: Secondary | ICD-10-CM | POA: Diagnosis not present

## 2023-12-04 DIAGNOSIS — C44622 Squamous cell carcinoma of skin of right upper limb, including shoulder: Secondary | ICD-10-CM | POA: Diagnosis not present

## 2023-12-08 NOTE — Progress Notes (Unsigned)
 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  BRIEF ONCOLOGIC HISTORY:  Oncology History  Malignant neoplasm of glottis (HCC)  04/03/2019 Cancer Staging   Staging form: Larynx - Glottis, AJCC 8th Edition - Pathologic stage from 04/03/2019: Stage III (pT3, pN1, cM0) - Signed by Izell Domino, MD on 04/06/2019   04/06/2019 Initial Diagnosis   Malignant neoplasm of glottis (HCC)    Miscellaneous   -s/p laryngopharyngectomy, BND, and TEP, with a RFFF reconstruction (Dr Murry) on 03/06/19 -Pathology with 2 cm tumor, negative margins, +PNI, no LVI, and 1/47 nodes ( 4 mm, left neck, no ENE) -S/p adjuvant RT (Dr. Izell), completed 06/01/19    09/02/2019 Imaging   Restaging CT neck: IMPRESSION: NI-RADS category 1. No CT evidence of residual or recurrent disease. Extensive postsurgical changes as outlined above.     12/03/2022 Survivorship   Survivorship visit with Iceis Knab, NP      INTERVAL HISTORY:  Ms. Shed returns for follow up as scheduled. Last seen by me 12/03/22. Doing well overall. PCP I referred her to never called. She plans to pursue health care outside of [facility]. Continues SLP. Eating/drinking well.   -Pain: None -Nutrition/Diet: good  -Dysphagia?: intermittent, dilations PRN -Dental issues?: q4 months  -Last TSH: Today -Weight: stable  -Last ENT visit:  Dr. Lauralee 09/2023 -Last Rad Onc visit: Dr. Izell, 03/2022 PRN -Last Dentist visit:  q4 months      ADDITIONAL REVIEW OF SYSTEMS:  ROS    CURRENT MEDICATIONS:  Current Outpatient Medications on File Prior to Visit  Medication Sig Dispense Refill   cephALEXin  (KEFLEX ) 500 MG capsule Take one capsule by mouth tonight before bed and one capsule tomorrow morning with breakfast. 2 capsule 0   ibuprofen  (ADVIL ) 200 MG tablet Take 3 tablets (600 mg total) by mouth every 8 (eight) hours as needed for moderate pain. 60 tablet 0   methocarbamol  (ROBAXIN ) 500 MG tablet Take 1 tablet (500 mg total) by mouth every 8 (eight) hours as needed for muscle spasms. 30 tablet 1   Vitamin D , Ergocalciferol , (DRISDOL ) 1.25 MG (50000 UNIT) CAPS capsule Take 1 capsule (50,000 Units total) by mouth every 7 (seven) days. 8 capsule 0   No current facility-administered medications on file prior to visit.    ALLERGIES:  No Known Allergies   PHYSICAL EXAM:  Vitals:   12/09/23 1208  BP: 130/70  Pulse: 62  Resp: 17  Temp: 97.8 F (36.6 C)  SpO2: 99%   Filed Weights   12/09/23 1208  Weight: 118 lb 11.2 oz (53.8 kg)    General: Well-nourished, well-appearing female in no acute distress.   HEENT: Sclerae anicteric. Oral mucosa is pink and moist without lesions.  Tongue pink, moist, and midline. Oropharynx is pink and moist, without lesions. TEP in place Lymph: No preauricular, postauricular, cervical, supraclavicular, or infraclavicular lymphadenopathy noted on palpation.   Neck: No palpable masses. Cardiovascular: Normal rate and rhythm. Respiratory: Clear to auscultation bilaterally. Chest expansion symmetric without accessory muscle use; breathing non-labored.  GI: Abdomen soft and  round. Non-tender, non-distended. Bowel sounds normoactive.  Neuro: No focal deficits. Steady gait.   Psych: Normal mood and affect for situation. Extremities: No edema.  Skin: Warm and dry.    LABORATORY DATA:     Latest Ref Rng & Units 12/09/2023   12:55 PM 12/21/2021    3:09 PM 08/17/2020    4:14 AM  CBC   WBC 4.0 - 10.5 K/uL 6.0   8.4   Hemoglobin 12.0 - 15.0 g/dL 86.0  84.6  87.6   Hematocrit 36.0 - 46.0 % 39.8  45.0  37.0   Platelets 150 - 400 K/uL 242   192        Latest Ref Rng & Units 12/21/2021    3:09 PM 08/17/2020    4:14 AM 08/16/2020    9:13 PM  CMP  Glucose 70 - 99 mg/dL 92  874  897   BUN 8 - 23 mg/dL 16  9  10    Creatinine 0.44 - 1.00 mg/dL 9.39  9.42  9.34   Sodium 135 - 145 mmol/L 137  135  137   Potassium 3.5 - 5.1 mmol/L 3.7  3.7  3.5   Chloride 98 - 111 mmol/L 112  104  101   CO2 22 - 32 mmol/L  21  25   Calcium 8.9 - 10.3 mg/dL  8.2  8.9   Total Protein 6.5 - 8.1 g/dL  6.6  7.4   Total Bilirubin 0.3 - 1.2 mg/dL  1.4  1.1   Alkaline Phos 38 - 126 U/L  75  82   AST 15 - 41 U/L  25  27   ALT 0 - 44 U/L  16  18      DIAGNOSTIC IMAGING:  None at this visit.    ASSESSMENT & PLAN:  Ms. Vanderloop is a pleasant 73 y.o. female with history of laryngeal cancer, diagnosed in 03/2019;  treated with laryngectomy and adjuvant radiation; completed treatment on 06/01/19.  Patient presents to survivorship clinic today for routine follow-up.   #. Cancer:  Ms. Brazill is clinically without evidence of disease or recurrence on physical exam today.     #. Nutritional status: Ms. Verdi reports that she is currently able to consume adequate nutrition by mouth.  Weight is stable.  Requires occasional dilations.  Encouraged to continue to consume adequate hydration and nutrition, as tolerated.    #. At risk for dysphagia: Continue SLP for TEP  #.  At risk for neck lymphedema: No evidence of LE, no need for PT at this time  #.  At risk for hypothyroidism: Overdue, level is pending from today   #. At risk for tooth decay/dental concerns: Continue dental follow-up every 4 months  #.  Lung cancer screening:  [facility] now offers eligible patients lung cancer screening with a low-dose chest CT to aid in early detection, provide more effective treatment options, and ultimately improve  survival benefits for patients diagnosed early.  Below is the selection criteria for screening:  Medicare patients: 55-77 years; privately insured patients 55-80 years. Active or former smokers who have quit within the last 15 years. 30+ pack-year history of smoking  Exclusion criteria - No signs/symptoms of lung cancer (i.e., no recent history of hemoptysis and no unexplained weight loss >15 pounds in the last 6 months). Willing and healthy enough to undergo biopsies/surgery if needed.  Ms. Strahm currently does/does not meet criteria for lung cancer screening.    #. Tobacco & alcohol use: Ms.  Ram reports that she does not have a significant history of smoking.  Voiced understanding of the importance of continuing to remain both tobacco and alcohol-free.    #. Health maintenance and wellness promotion: Cancer patients who consume a diet rich in fruits and vegetables have better overall health and decreased risk of cancer recurrence. Ms. Shader was encouraged to consume 5-7 servings of fruits and vegetables per day, as tolerated. Ms. Donofrio was also encouraged to engage in moderate to vigorous exercise for 30 minutes per day most days of the week.   #. Support services/counseling: It is not uncommon for this period of the patient's cancer care trajectory to be one of many emotions and stressors.  We discussed an opportunity for her to participate in the next session of Head & Neck FYNN (Finding Your New Normal) support group series, designed for patients after they have completed treatment.   Ms. Twombly was encouraged to take advantage of our many other support services programs, support groups, and/or counseling in coping with her new life as a cancer survivor after completing anti-cancer treatment. The patient was offered support today through active listening and expressive supportive counseling.     Dispo:  -See Dr. Lebron in (ENT) 03/2024 -Return to cancer center to see Dr. Izell as  needed - Nearing 5 years from diagnosis, will discharge her from my clinic given that she would like to transition her health care away from [facility] health  -Basic labs today, she will establish with new PCP soon -Ordered mammo and DEXA for her    A total of 30 minutes minutes was spent in the face-to-face care of this patient, with greater than 50% of that time spent in counseling and care-coordination.    Daxen Lanum, NP Survivorship Program [facility] [phone]

## 2023-12-09 ENCOUNTER — Inpatient Hospital Stay

## 2023-12-09 ENCOUNTER — Encounter: Payer: Self-pay | Admitting: Nurse Practitioner

## 2023-12-09 ENCOUNTER — Inpatient Hospital Stay: Payer: Medicare HMO | Attending: Nurse Practitioner | Admitting: Nurse Practitioner

## 2023-12-09 VITALS — BP 130/70 | HR 62 | Temp 97.8°F | Resp 17 | Wt 118.7 lb

## 2023-12-09 DIAGNOSIS — Z1231 Encounter for screening mammogram for malignant neoplasm of breast: Secondary | ICD-10-CM

## 2023-12-09 DIAGNOSIS — Z8639 Personal history of other endocrine, nutritional and metabolic disease: Secondary | ICD-10-CM | POA: Insufficient documentation

## 2023-12-09 DIAGNOSIS — Z79620 Long term (current) use of immunosuppressive biologic: Secondary | ICD-10-CM | POA: Insufficient documentation

## 2023-12-09 DIAGNOSIS — Z87891 Personal history of nicotine dependence: Secondary | ICD-10-CM | POA: Insufficient documentation

## 2023-12-09 DIAGNOSIS — C320 Malignant neoplasm of glottis: Secondary | ICD-10-CM | POA: Diagnosis not present

## 2023-12-09 DIAGNOSIS — Z923 Personal history of irradiation: Secondary | ICD-10-CM | POA: Insufficient documentation

## 2023-12-09 DIAGNOSIS — Z9189 Other specified personal risk factors, not elsewhere classified: Secondary | ICD-10-CM | POA: Diagnosis not present

## 2023-12-09 LAB — CMP (CANCER CENTER ONLY)
ALT: 11 U/L (ref 0–44)
AST: 17 U/L (ref 15–41)
Albumin: 4.5 g/dL (ref 3.5–5.0)
Alkaline Phosphatase: 78 U/L (ref 38–126)
Anion gap: 9 (ref 5–15)
BUN: 11 mg/dL (ref 8–23)
CO2: 26 mmol/L (ref 22–32)
Calcium: 9.5 mg/dL (ref 8.9–10.3)
Chloride: 106 mmol/L (ref 98–111)
Creatinine: 0.74 mg/dL (ref 0.44–1.00)
GFR, Estimated: >60 mL/min (ref >60)
Glucose, Bld: 92 mg/dL (ref 70–99)
Potassium: 3.1 mmol/L — ABNORMAL LOW (ref 3.5–5.1)
Sodium: 141 mmol/L (ref 135–145)
Total Bilirubin: 0.9 mg/dL (ref 0.0–1.2)
Total Protein: 7.5 g/dL (ref 6.5–8.1)

## 2023-12-09 LAB — CBC WITH DIFFERENTIAL (CANCER CENTER ONLY)
Abs Immature Granulocytes: 0.01 K/uL (ref 0.00–0.07)
Basophils Absolute: 0 K/uL (ref 0.0–0.1)
Basophils Relative: 1 %
Eosinophils Absolute: 0.1 K/uL (ref 0.0–0.5)
Eosinophils Relative: 1 %
HCT: 39.8 % (ref 36.0–46.0)
Hemoglobin: 13.9 g/dL (ref 12.0–15.0)
Immature Granulocytes: 0 %
Lymphocytes Relative: 25 %
Lymphs Abs: 1.5 K/uL (ref 0.7–4.0)
MCH: 31.3 pg (ref 26.0–34.0)
MCHC: 34.9 g/dL (ref 30.0–36.0)
MCV: 89.6 fL (ref 80.0–100.0)
Monocytes Absolute: 0.3 K/uL (ref 0.1–1.0)
Monocytes Relative: 5 %
Neutro Abs: 4.1 K/uL (ref 1.7–7.7)
Neutrophils Relative %: 68 %
Platelet Count: 242 K/uL (ref 150–400)
RBC: 4.44 MIL/uL (ref 3.87–5.11)
RDW: 13 % (ref 11.5–15.5)
WBC Count: 6 K/uL (ref 4.0–10.5)
nRBC: 0 % (ref 0.0–0.2)

## 2023-12-09 LAB — TSH: TSH: 3.15 u[IU]/mL (ref 0.350–4.500)

## 2023-12-09 LAB — VITAMIN D 25 HYDROXY (VIT D DEFICIENCY, FRACTURES): Vit D, 25-Hydroxy: 28.81 ng/mL — ABNORMAL LOW (ref 30–100)

## 2023-12-11 ENCOUNTER — Other Ambulatory Visit: Payer: Self-pay | Admitting: Nurse Practitioner

## 2023-12-11 ENCOUNTER — Ambulatory Visit: Payer: Self-pay | Admitting: Nurse Practitioner

## 2023-12-11 ENCOUNTER — Other Ambulatory Visit (HOSPITAL_BASED_OUTPATIENT_CLINIC_OR_DEPARTMENT_OTHER): Payer: Self-pay

## 2023-12-11 MED ORDER — VITAMIN D (ERGOCALCIFEROL) 1.25 MG (50000 UNIT) PO CAPS
50000.0000 [IU] | ORAL_CAPSULE | ORAL | 0 refills | Status: AC
  Filled 2023-12-11: qty 12, 84d supply, fill #0

## 2023-12-12 ENCOUNTER — Other Ambulatory Visit (HOSPITAL_BASED_OUTPATIENT_CLINIC_OR_DEPARTMENT_OTHER): Payer: Self-pay

## 2023-12-27 DIAGNOSIS — C44622 Squamous cell carcinoma of skin of right upper limb, including shoulder: Secondary | ICD-10-CM | POA: Diagnosis not present

## 2024-01-07 DIAGNOSIS — R490 Dysphonia: Secondary | ICD-10-CM | POA: Diagnosis not present

## 2024-01-07 DIAGNOSIS — C329 Malignant neoplasm of larynx, unspecified: Secondary | ICD-10-CM | POA: Diagnosis not present

## 2024-01-07 DIAGNOSIS — Z930 Tracheostomy status: Secondary | ICD-10-CM | POA: Diagnosis not present

## 2024-01-07 DIAGNOSIS — Z449 Encounter for fitting and adjustment of unspecified external prosthetic device: Secondary | ICD-10-CM | POA: Diagnosis not present

## 2024-01-22 DIAGNOSIS — C44329 Squamous cell carcinoma of skin of other parts of face: Secondary | ICD-10-CM | POA: Diagnosis not present

## 2024-01-22 DIAGNOSIS — X32XXXD Exposure to sunlight, subsequent encounter: Secondary | ICD-10-CM | POA: Diagnosis not present

## 2024-01-22 DIAGNOSIS — C44529 Squamous cell carcinoma of skin of other part of trunk: Secondary | ICD-10-CM | POA: Diagnosis not present

## 2024-01-22 DIAGNOSIS — L570 Actinic keratosis: Secondary | ICD-10-CM | POA: Diagnosis not present

## 2024-01-23 DIAGNOSIS — H52223 Regular astigmatism, bilateral: Secondary | ICD-10-CM | POA: Diagnosis not present

## 2024-01-29 DIAGNOSIS — C44622 Squamous cell carcinoma of skin of right upper limb, including shoulder: Secondary | ICD-10-CM | POA: Diagnosis not present

## 2024-01-29 DIAGNOSIS — L905 Scar conditions and fibrosis of skin: Secondary | ICD-10-CM | POA: Diagnosis not present

## 2024-02-04 DIAGNOSIS — J398 Other specified diseases of upper respiratory tract: Secondary | ICD-10-CM | POA: Diagnosis not present

## 2024-02-04 DIAGNOSIS — J9502 Infection of tracheostomy stoma: Secondary | ICD-10-CM | POA: Diagnosis not present

## 2024-02-04 DIAGNOSIS — C329 Malignant neoplasm of larynx, unspecified: Secondary | ICD-10-CM | POA: Diagnosis not present

## 2024-02-26 DIAGNOSIS — H401122 Primary open-angle glaucoma, left eye, moderate stage: Secondary | ICD-10-CM | POA: Diagnosis not present

## 2024-02-26 DIAGNOSIS — M25552 Pain in left hip: Secondary | ICD-10-CM | POA: Diagnosis not present

## 2024-02-26 DIAGNOSIS — H527 Unspecified disorder of refraction: Secondary | ICD-10-CM | POA: Diagnosis not present

## 2024-02-26 DIAGNOSIS — H401111 Primary open-angle glaucoma, right eye, mild stage: Secondary | ICD-10-CM | POA: Diagnosis not present

## 2024-02-26 DIAGNOSIS — H25093 Other age-related incipient cataract, bilateral: Secondary | ICD-10-CM | POA: Diagnosis not present

## 2024-03-04 DIAGNOSIS — Z08 Encounter for follow-up examination after completed treatment for malignant neoplasm: Secondary | ICD-10-CM | POA: Diagnosis not present

## 2024-03-04 DIAGNOSIS — Z85828 Personal history of other malignant neoplasm of skin: Secondary | ICD-10-CM | POA: Diagnosis not present

## 2024-03-04 DIAGNOSIS — X32XXXD Exposure to sunlight, subsequent encounter: Secondary | ICD-10-CM | POA: Diagnosis not present

## 2024-03-04 DIAGNOSIS — L570 Actinic keratosis: Secondary | ICD-10-CM | POA: Diagnosis not present

## 2024-03-31 DIAGNOSIS — C329 Malignant neoplasm of larynx, unspecified: Secondary | ICD-10-CM | POA: Diagnosis not present

## 2024-03-31 DIAGNOSIS — J398 Other specified diseases of upper respiratory tract: Secondary | ICD-10-CM | POA: Diagnosis not present

## 2024-04-17 ENCOUNTER — Ambulatory Visit
Admission: EM | Admit: 2024-04-17 | Discharge: 2024-04-17 | Disposition: A | Discharge status: Home / Self Care | Attending: Family Medicine | Admitting: Family Medicine

## 2024-04-17 DIAGNOSIS — H1033 Unspecified acute conjunctivitis, bilateral: Secondary | ICD-10-CM

## 2024-04-17 MED ORDER — OFLOXACIN 0.3 % OP SOLN: 1.0000 [drp] | Freq: Four times a day (QID) | OPHTHALMIC | 0 refills | Status: AC

## 2024-04-17 NOTE — ED Provider Notes (Signed)
 " [facility]    CSN: [id number] Arrival date & time: 04/17/24  1035      History   Chief Complaint Chief Complaint  Patient presents with   Eye Problem    HPI SHENAY TORTI is a 73 y.o. female.   Patient presenting today with 2-day history of bilateral eye redness, irritation, itching, drainage.  Denies visual change, headache, nausea, vomiting, injury to the area.  So far trying over-the-counter eyedrops with minimal relief.  Typically wear contact lenses, had to stop wearing them several days ago.    Past Medical History:  Diagnosis Date   melanoma 1990    Patient Active Problem List   Diagnosis Date Noted   Closed displaced fracture of olecranon process with intraarticular extension of right ulna    Subarachnoid bleed (HCC) 08/30/2020   Subdural hematoma (HCC) 08/17/2020   Scalp laceration 08/17/2020   Headache 08/17/2020   Retro-orbital pain of right eye 08/17/2020   Syncope 08/17/2020   GERD (gastroesophageal reflux disease) 08/17/2020   History of malignant neoplasm of glottis 08/17/2020   Subarachnoid hemorrhage (HCC) 08/16/2020   Malignant neoplasm of glottis (HCC) 04/06/2019   Osteopenia 04/17/2016   Sun-damaged skin 03/31/2016    Past Surgical History:  Procedure Laterality Date   CESAREAN SECTION     FREE FLAP RADIAL FOREARM  03/06/2019   Dr. Lauralee [facility]   MODIFIED RADICAL NECK DISSECTION  03/06/2019   NECK DISSECTION  03/06/2019   bilateral neck dissection   NERVE GRAFT Left 03/06/2019   left arm lower, Dr. Lauralee at [facility]   ORIF ULNAR FRACTURE Right 12/21/2021   Procedure: OPEN REDUCTION INTERNAL FIXATION (ORIF) RIGHT PROXIMAL ULNAR FRACTURE;  Surgeon: Addie Cordella Hamilton, MD;  Location: [facility] OR;  Service: Orthopedics;  Laterality: Right;   PHARYNGECTOMY  03/06/2019   Dr. [facility]alee at [facility]   TOTAL LARYNGECTOMY  03/06/2019   Dr. Lauralee at [facility]   VOICE PROSTHESIS  03/06/2019   pharyngoplasty, voice prosthesis/TEP placement, Dr.  Lauralee [facility]   WRIST SURGERY  06/06/2018   ORIF distal radius fracture    OB History     Gravida      Para      Term      Preterm      AB      Living  1      SAB      IAB      Ectopic      Multiple      Live Births               Home Medications    Prior to Admission medications  Medication Sig Start Date End Date Taking? Authorizing Provider  ofloxacin  (OCUFLOX ) 0.3 % ophthalmic solution Place 1 drop into both eyes 4 (four) times daily. 04/17/24  Yes Stuart Vernell Norris, PA-C  cephALEXin  (KEFLEX ) 500 MG capsule Take one capsule by mouth tonight before bed and one capsule tomorrow morning with breakfast. 12/21/21   Magnant, Carlin LITTIE, PA-C  ibuprofen  (ADVIL ) 200 MG tablet Take 3 tablets (600 mg total) by mouth every 8 (eight) hours as needed for moderate pain. 12/21/21   Magnant, Charles L, PA-C  methocarbamol  (ROBAXIN ) 500 MG tablet Take 1 tablet (500 mg total) by mouth every 8 (eight) hours as needed for muscle spasms. 12/21/21   Magnant, Charles L, PA-C  Vitamin D , Ergocalciferol , (DRISDOL ) 1.25 MG (50000 UNIT) CAPS capsule Take 1 capsule (50,000 Units total) by mouth every 7 (seven) days. 12/11/23  Burton, Lacie K, NP    Family History Family History  Problem Relation Age of Onset   Hypertension Mother    Lung cancer Father     Social History Social History[1]   Allergies   Patient has no known allergies.   Review of Systems Review of Systems PER HPI  Physical Exam Triage Vital Signs ED Triage Vitals  Encounter Vitals Group     BP 04/17/24 1101 121/78     Girls Systolic BP Percentile --      Girls Diastolic BP Percentile --      Boys Systolic BP Percentile --      Boys Diastolic BP Percentile --      Pulse Rate 04/17/24 1101 72     Resp 04/17/24 1101 20     Temp 04/17/24 1101 98.4 F (36.9 C)     Temp Source 04/17/24 1101 Oral     SpO2 04/17/24 1101 97 %     Weight --      Height --      Head Circumference --      Peak Flow --       Pain Score 04/17/24 1058 4     Pain Loc --      Pain Education --      Exclude from Growth Chart --    No data found.  Updated Vital Signs BP 121/78 (BP Location: Right Arm)   Pulse 72   Temp 98.4 F (36.9 C) (Oral)   Resp 20   SpO2 97%   Visual Acuity Right Eye Distance:   Left Eye Distance:   Bilateral Distance:    Right Eye Near:   Left Eye Near:    Bilateral Near:     Physical Exam Vitals and nursing note reviewed.  Constitutional:      Appearance: Normal appearance. She is not ill-appearing.  HENT:     Head: Atraumatic.     Mouth/Throat:     Mouth: Mucous membranes are moist.  Eyes:     Extraocular Movements: Extraocular movements intact.     Pupils: Pupils are equal, round, and reactive to light.     Comments: Bilateral conjunctival injection, erythema, drainage  Cardiovascular:     Rate and Rhythm: Normal rate.  Pulmonary:     Effort: Pulmonary effort is normal.  Musculoskeletal:        General: Normal range of motion.     Cervical back: Normal range of motion and neck supple.  Skin:    General: Skin is warm and dry.  Neurological:     Mental Status: She is alert and oriented to person, place, and time.  Psychiatric:        Mood and Affect: Mood normal.        Thought Content: Thought content normal.        Judgment: Judgment normal.    UC Treatments / Results  Labs (all labs ordered are listed, but only abnormal results are displayed) Labs Reviewed - No data to display  EKG   Radiology No results found.  Procedures Procedures (including critical care time)  Medications Ordered in UC Medications - No data to display  Initial Impression / Assessment and Plan / UC Course  I have reviewed the triage vital signs and the nursing notes.  Pertinent labs & imaging results that were available during my care of the patient were reviewed by me and considered in my medical decision making (see chart for details).     Visual  acuity declined  as vision intact per patient.  Will treat for conjunctivitis with ofloxacin , compresses, good handwashing.  Return for worsening or unresolving symptoms.  Final Clinical Impressions(s) / UC Diagnoses   Final diagnoses:  Acute bacterial conjunctivitis of both eyes   Discharge Instructions   None    ED Prescriptions     Medication Sig Dispense Auth. Provider   ofloxacin  (OCUFLOX ) 0.3 % ophthalmic solution Place 1 drop into both eyes 4 (four) times daily. 5 mL Stuart Vernell Norris, [place]      PDMP not reviewed this encounter.    [1]  Social History Tobacco Use   Smoking status: Never   Smokeless tobacco: Never  Vaping Use   Vaping status: Never Used  Substance Use Topics   Alcohol use: Yes    Comment: wine occ   Drug use: Not Currently     Stuart Vernell Norris, PA-C 04/17/24 1313  "

## 2024-04-17 NOTE — ED Triage Notes (Signed)
 Pt reports she has bilateral eye irritation, pain, swelling, redness x 2 days   Tried eye drops
